# Patient Record
Sex: Male | Born: 1965 | Race: White | Hispanic: No | Marital: Married | Smoking: Current every day smoker
Health system: Southern US, Community
[De-identification: ages and names within clinical notes are randomized; demographics above are authoritative.]

## PROBLEM LIST (undated history)

## (undated) DIAGNOSIS — G250 Essential tremor: Secondary | ICD-10-CM

## (undated) DIAGNOSIS — I2510 Atherosclerotic heart disease of native coronary artery without angina pectoris: Secondary | ICD-10-CM

## (undated) DIAGNOSIS — E785 Hyperlipidemia, unspecified: Secondary | ICD-10-CM

## (undated) DIAGNOSIS — J189 Pneumonia, unspecified organism: Secondary | ICD-10-CM

## (undated) DIAGNOSIS — E119 Type 2 diabetes mellitus without complications: Secondary | ICD-10-CM

## (undated) DIAGNOSIS — I10 Essential (primary) hypertension: Secondary | ICD-10-CM

## (undated) HISTORY — PX: UMBILICAL HERNIA REPAIR: SHX2598

## (undated) HISTORY — DX: Hyperlipidemia, unspecified: E78.5

## (undated) HISTORY — PX: HERNIA REPAIR: SHX51

## (undated) HISTORY — DX: Essential tremor: G25.0

## (undated) HISTORY — PX: VASECTOMY: SHX75

## (undated) HISTORY — PX: FRACTURE SURGERY: SHX138

---

## 2019-04-02 ENCOUNTER — Other Ambulatory Visit: Payer: Self-pay

## 2019-04-02 DIAGNOSIS — Z20822 Contact with and (suspected) exposure to covid-19: Secondary | ICD-10-CM

## 2019-04-03 LAB — NOVEL CORONAVIRUS, NAA: SARS-CoV-2, NAA: NOT DETECTED

## 2019-07-16 HISTORY — PX: HIP ARTHROPLASTY: SHX981

## 2020-06-01 ENCOUNTER — Ambulatory Visit: Payer: Self-pay | Admitting: Cardiology

## 2020-08-03 ENCOUNTER — Ambulatory Visit: Payer: BC Managed Care – PPO | Admitting: Cardiology

## 2020-08-03 ENCOUNTER — Other Ambulatory Visit: Payer: Self-pay

## 2020-08-03 ENCOUNTER — Encounter: Payer: Self-pay | Admitting: Cardiology

## 2020-08-03 VITALS — BP 130/89 | HR 92 | Temp 98.7°F | Resp 16 | Ht 72.0 in | Wt 233.0 lb

## 2020-08-03 DIAGNOSIS — Z87891 Personal history of nicotine dependence: Secondary | ICD-10-CM

## 2020-08-03 DIAGNOSIS — M791 Myalgia, unspecified site: Secondary | ICD-10-CM

## 2020-08-03 DIAGNOSIS — R0602 Shortness of breath: Secondary | ICD-10-CM

## 2020-08-03 DIAGNOSIS — E7800 Pure hypercholesterolemia, unspecified: Secondary | ICD-10-CM

## 2020-08-03 DIAGNOSIS — Z8249 Family history of ischemic heart disease and other diseases of the circulatory system: Secondary | ICD-10-CM

## 2020-08-03 DIAGNOSIS — R072 Precordial pain: Secondary | ICD-10-CM

## 2020-08-03 DIAGNOSIS — T466X5A Adverse effect of antihyperlipidemic and antiarteriosclerotic drugs, initial encounter: Secondary | ICD-10-CM

## 2020-08-03 MED ORDER — METOPROLOL TARTRATE 50 MG PO TABS: 50.0000 mg | ORAL_TABLET | Freq: Two times a day (BID) | ORAL | 0 refills | Status: DC

## 2020-08-03 NOTE — Patient Instructions (Signed)

## 2020-08-03 NOTE — Progress Notes (Signed)
Date:  08/03/2020   ID:  Phillip Clark, DOB 1966-03-28, MRN [id number]  PCP:  Percell Belt, DO  Cardiologist:  Rex Kras, DO, [facility] (established care 08/03/2020)  REASON FOR CONSULT: Chest pain  REQUESTING PHYSICIAN:  Lazoff, Shawn P, DO [address] [place],  [place] [id number]  Chief Complaint  Patient presents with  . Chest Pain  . New Patient (Initial Visit)    HPI  Phillip Clark is a 55 y.o. male who presents to the office with a chief complaint of " chest pain." Patient's past medical history and cardiovascular risk factors include: Family history of premature coronary artery disease, pure hypercholesterolemia, former smoker, myalgias to statin therapy.  He is referred to the office at the request of Lazoff, Shawn P, DO for evaluation of chest pain.  Patient states that he has been having chest discomfort for the last 6 months; however, over the last 6 weeks it has been more noticeable.  Is located over the left anterior chest wall, intensity is 2 out of 10, last for a few minutes, brought on by effort related activities such as going up flights of stairs and/or walking with his wife, and gets better with resting.  Patient states that taking a deep breath also helps his symptoms especially his shortness of breath.  Patient has family history of premature CAD with his brother having 2 PCI's done at the age of 20 and father having CABG at the age of 34.  Patient was recently started on Crestor by his PCP due to hypercholesterolemia.  However has been having myalgias involving his right hip and right knee and therefore has stopped taking the medication.  FUNCTIONAL STATUS: No structured exercise program or daily routine.   ALLERGIES: Allergies  Allergen Reactions  . Crestor [Rosuvastatin Calcium]     MYALGIAS    MEDICATION LIST PRIOR TO VISIT: Current Meds  Medication Sig  . metoprolol tartrate (LOPRESSOR) 50 MG tablet Take 1 tablet (50 mg total) by mouth 2 (two)  times daily for 10 days.     PAST MEDICAL HISTORY: Past Medical History:  Diagnosis Date  . Essential tremor   . Hyperlipidemia     PAST SURGICAL HISTORY: Past Surgical History:  Procedure Laterality Date  . HERNIA REPAIR    . VASECTOMY      FAMILY HISTORY: The patient family history includes Diabetes in his mother; Heart attack in his father; Hyperlipidemia in his brother, father, mother, sister, sister, sister, and sister; Hypertension in his brother, father, mother, and sister; Thyroid disease in his mother.  SOCIAL HISTORY:  The patient  reports that he quit smoking about 4 years ago. His smoking use included cigarettes. He has a 34.00 pack-year smoking history. He has never used smokeless tobacco. He reports previous alcohol use. He reports that he does not use drugs.  REVIEW OF SYSTEMS: Review of Systems  Constitutional: Negative for chills and fever.  HENT: Negative for hoarse voice and nosebleeds.   Eyes: Negative for discharge, double vision and pain.  Cardiovascular: Positive for chest pain. Negative for claudication, dyspnea on exertion, leg swelling, near-syncope, orthopnea, palpitations, paroxysmal nocturnal dyspnea and syncope.  Respiratory: Positive for shortness of breath. Negative for hemoptysis.   Musculoskeletal: Negative for muscle cramps and myalgias.  Gastrointestinal: Negative for abdominal pain, constipation, diarrhea, hematemesis, hematochezia, melena, nausea and vomiting.  Neurological: Negative for dizziness and light-headedness.    PHYSICAL EXAM: Vitals with BMI 08/03/2020 08/03/2020  Height - $Remove'6\' 0"'pHbUbrW$   Weight -  233 lbs  BMI - 50.53  Systolic 976 734  Diastolic 89 90  Pulse 92 95    CONSTITUTIONAL: Well-developed and well-nourished. No acute distress.  SKIN: Skin is warm and dry. No rash noted. No cyanosis. No pallor. No jaundice HEAD: Normocephalic and atraumatic.  EYES: No scleral icterus MOUTH/THROAT: Moist oral membranes.  NECK: No JVD  present. No thyromegaly noted. No carotid bruits  LYMPHATIC: No visible cervical adenopathy.  CHEST Normal respiratory effort. No intercostal retractions  LUNGS: Clear to auscultation bilaterally.  No stridor. No wheezes. No rales.  CARDIOVASCULAR: Regular rate and rhythm, positive L9-F7, soft holosystolic murmur heard at the apex, no gallops or rubs. ABDOMINAL: No apparent ascites.  EXTREMITIES: No peripheral edema  HEMATOLOGIC: No significant bruising NEUROLOGIC: Oriented to person, place, and time. Nonfocal. Normal muscle tone.  PSYCHIATRIC: Normal mood and affect. Normal behavior. Cooperative  CARDIAC DATABASE: EKG: 08/03/2020: Normal sinus rhythm, 87 bpm, normal axis, left atrial enlargement without underlying ischemia or injury pattern.  Echocardiogram: No results found for this or any previous visit from the past 1095 days.   Stress Testing: No results found for this or any previous visit from the past 1095 days.  Heart Catheterization: None  LABORATORY DATA: External Labs: Collected: 06/29/2020, [facility] Hemoglobin 14.1 g/dL, hematocrit 40.9% Creatinine 0.94 mg/dL. eGFR: >90 mL/min per 1.73 m Lipid profile: Total cholesterol 239, triglycerides 130, HDL 45, LDL 199, non-HDL 194 AST 18, ALT 23, alkaline phosphatase 60   IMPRESSION:    ICD-10-CM   1. Precordial pain  R07.2 EKG 12-Lead    CT CORONARY MORPH W/CTA COR W/SCORE W/CA W/CM &/OR WO/CM    CT CORONARY FRACTIONAL FLOW RESERVE DATA PREP    CT CORONARY FRACTIONAL FLOW RESERVE FLUID ANALYSIS    metoprolol tartrate (LOPRESSOR) 50 MG tablet  2. Shortness of breath  R06.02 CT CORONARY MORPH W/CTA COR W/SCORE W/CA W/CM &/OR WO/CM    CT CORONARY FRACTIONAL FLOW RESERVE DATA PREP    CT CORONARY FRACTIONAL FLOW RESERVE FLUID ANALYSIS    PCV ECHOCARDIOGRAM COMPLETE  3. Pure hypercholesterolemia  E78.00 CK (Creatine Kinase)    CMP14+EGFR    High sensitivity CRP    LDL  cholesterol, direct    Lipoprotein A (LPA)    Lipid Panel With LDL/HDL Ratio  4. Former smoker  Z87.891   5. 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 Family history of premature CAD  Z82.49 CT CORONARY MORPH W/CTA COR W/SCORE W/CA W/CM &/OR WO/CM    CT CORONARY FRACTIONAL FLOW RESERVE DATA PREP    CT CORONARY FRACTIONAL FLOW RESERVE FLUID ANALYSIS    metoprolol tartrate (LOPRESSOR) 50 MG tablet     RECOMMENDATIONS: Klinton Candelas is a 55 y.o. male whose past medical history and cardiac risk factors include: Family history of premature coronary artery disease, pure hypercholesterolemia, former smoker, myalgias to statin therapy.  Precordial pain:  Patient symptoms are concerning for both typical and atypical features and has multiple cardiovascular risk factors as outlined above.  No active chest pain at the time of evaluation.  EKG shows normal sinus rhythm without underlying injury pattern.  Echocardiogram will be ordered to evaluate for structural heart disease and left ventricular systolic function.  Patient is not an ideal candidate for stress test as he is unable to exercise due to right hip and right knee pain.  Patient will be scheduled for coronary CTA.  Start Lopressor  50 mg p.o. twice daily  Hypercholesterolemia:  Patient is LDL is 199 mg/dL.  Was started on statin therapy by his PCP, Crestor 10 mg p.o. nightly.  After taking it for a few weeks patient started complaining of myalgias and the shared decision was to stop statin therapy and work on dietary changes as instructed per PCP.  We will recheck fasting lipid profile, check liver function, and CPK.  If patient does not have any signs of myositis would consider statin therapy given his symptoms and lipid profile.  Awaiting results.  Estimated 10-year risk of ASCVD 6%, per guidelines moderate intensity statin recommended.  However as discussed above due to myalgias  will await lab results prior to reordering another statin therapy.  Former smoker: Educated on the importance of continued smoking cessation.  Patient is asked to seek medical attention by going to the closest ER via EMS if his chest pain increases in intensity, frequency, duration or has typical discomfort as discussed at today's office visit.  Patient verbalizes understanding.  FINAL MEDICATION LIST END OF ENCOUNTER: Meds ordered this encounter  Medications  . metoprolol tartrate (LOPRESSOR) 50 MG tablet    Sig: Take 1 tablet (50 mg total) by mouth 2 (two) times daily for 10 days.    Dispense:  20 tablet    Refill:  0    There are no discontinued medications.   Current Outpatient Medications:  .  metoprolol tartrate (LOPRESSOR) 50 MG tablet, Take 1 tablet (50 mg total) by mouth 2 (two) times daily for 10 days., Disp: 20 tablet, Rfl: 0  Orders Placed This Encounter  Procedures  . CT CORONARY MORPH W/CTA COR W/SCORE W/CA W/CM &/OR WO/CM  . CT CORONARY FRACTIONAL FLOW RESERVE DATA PREP  . CT CORONARY FRACTIONAL FLOW RESERVE FLUID ANALYSIS  . CK (Creatine Kinase)  . CMP14+EGFR  . High sensitivity CRP  . LDL cholesterol, direct  . Lipoprotein A (LPA)  . Lipid Panel With LDL/HDL Ratio  . EKG 12-Lead  . PCV ECHOCARDIOGRAM COMPLETE    Patient Instructions   [email or web address]">  DASH Eating Plan DASH stands for Dietary Approaches to Stop Hypertension. The DASH eating plan is a healthy eating plan that has been shown to:  Reduce high blood pressure (hypertension).  Reduce your risk for type 2 diabetes, heart disease, and stroke.  Help with weight loss. What are tips for following this plan? Reading food labels  Check food labels for the amount of salt (sodium) per serving. Choose foods with less than 5 percent of the Daily Value of sodium. Generally, foods with less than 300 milligrams (mg) of sodium per serving fit into this  eating plan.  To find whole grains, look for the word "whole" as the first word in the ingredient list. Shopping  Buy products labeled as "low-sodium" or "no salt added."  Buy fresh foods. Avoid canned foods and pre-made or frozen meals. Cooking  Avoid adding salt when cooking. Use salt-free seasonings or herbs instead of table salt or sea salt. Check with your health care provider or pharmacist before using salt substitutes.  Do not fry foods. Cook foods using healthy methods such as baking, boiling, grilling, roasting, and broiling instead.  Cook with heart-healthy oils, such as olive, canola, avocado, soybean, or sunflower oil. Meal planning  Eat a balanced diet that includes: ? 4 or more servings of fruits and 4 or more servings of vegetables each day. Try to fill one-half of your plate with fruits and vegetables. ? 6-8  servings of whole grains each day. ? Less than 6 oz (170 g) of lean meat, poultry, or fish each day. A 3-oz (85-g) serving of meat is about the same size as a deck of cards. One egg equals 1 oz (28 g). ? 2-3 servings of low-fat dairy each day. One serving is 1 cup (237 mL). ? 1 serving of nuts, seeds, or beans 5 times each week. ? 2-3 servings of heart-healthy fats. Healthy fats called omega-3 fatty acids are found in foods such as walnuts, flaxseeds, fortified milks, and eggs. These fats are also found in cold-water fish, such as sardines, salmon, and mackerel.  Limit how much you eat of: ? Canned or prepackaged foods. ? Food that is high in trans fat, such as some fried foods. ? Food that is high in saturated fat, such as fatty meat. ? Desserts and other sweets, sugary drinks, and other foods with added sugar. ? Full-fat dairy products.  Do not salt foods before eating.  Do not eat more than 4 egg yolks a week.  Try to eat at least 2 vegetarian meals a week.  Eat more home-cooked food and less restaurant, buffet, and fast food.   Lifestyle  When eating  at a restaurant, ask that your food be prepared with less salt or no salt, if possible.  If you drink alcohol: ? Limit how much you use to:  0-1 drink a day for women who are not pregnant.  0-2 drinks a day for men. ? Be aware of how much alcohol is in your drink. In the [place] one drink equals one 12 oz bottle of beer (355 mL), one 5 oz glass of wine (148 mL), or one 1 oz glass of hard liquor (44 mL). General information  Avoid eating more than 2,300 mg of salt a day. If you have hypertension, you may need to reduce your sodium intake to 1,500 mg a day.  Work with your health care provider to maintain a healthy body weight or to lose weight. Ask what an ideal weight is for you.  Get at least 30 minutes of exercise that causes your heart to beat faster (aerobic exercise) most days of the week. Activities may include walking, swimming, or biking.  Work with your health care provider or dietitian to adjust your eating plan to your individual calorie needs. What foods should I eat? Fruits All fresh, dried, or frozen fruit. Canned fruit in natural juice (without added sugar). Vegetables Fresh or frozen vegetables (raw, steamed, roasted, or grilled). Low-sodium or reduced-sodium tomato and vegetable juice. Low-sodium or reduced-sodium tomato sauce and tomato paste. Low-sodium or reduced-sodium canned vegetables. Grains Whole-grain or whole-wheat bread. Whole-grain or whole-wheat pasta. Brown rice. [place]. Bulgur. Whole-grain and low-sodium cereals. Pita bread. Low-fat, low-sodium crackers. Whole-wheat flour tortillas. Meats and other proteins Skinless chicken or [place]. Ground chicken or [place]. Pork with fat trimmed off. Fish and seafood. Egg whites. Dried beans, peas, or lentils. Unsalted nuts, nut butters, and seeds. Unsalted canned beans. Lean cuts of beef with fat trimmed off. Low-sodium, lean precooked or cured meat, such as sausages or meat loaves. Dairy Low-fat (1%) or  fat-free (skim) milk. Reduced-fat, low-fat, or fat-free cheeses. Nonfat, low-sodium ricotta or cottage cheese. Low-fat or nonfat yogurt. Low-fat, low-sodium cheese. Fats and oils Soft margarine without trans fats. Vegetable oil. Reduced-fat, low-fat, or light mayonnaise and salad dressings (reduced-sodium). Canola, safflower, olive, avocado, soybean, and sunflower oils. Avocado. Seasonings and condiments Herbs. Spices. Seasoning mixes without salt. Other  foods Unsalted popcorn and pretzels. Fat-free sweets. The items listed above may not be a complete list of foods and beverages you can eat. Contact a dietitian for more information. What foods should I avoid? Fruits Canned fruit in a light or heavy syrup. Fried fruit. Fruit in cream or butter sauce. Vegetables Creamed or fried vegetables. Vegetables in a cheese sauce. Regular canned vegetables (not low-sodium or reduced-sodium). Regular canned tomato sauce and paste (not low-sodium or reduced-sodium). Regular tomato and vegetable juice (not low-sodium or reduced-sodium). Angie Fava. Olives. Grains Baked goods made with fat, such as croissants, muffins, or some breads. Dry pasta or rice meal packs. Meats and other proteins Fatty cuts of meat. Ribs. Fried meat. Berniece Salines. Bologna, salami, and other precooked or cured meats, such as sausages or meat loaves. Fat from the back of a pig (fatback). Bratwurst. Salted nuts and seeds. Canned beans with added salt. Canned or smoked fish. Whole eggs or egg yolks. Chicken or [place] with skin. Dairy Whole or 2% milk, cream, and half-and-half. Whole or full-fat cream cheese. Whole-fat or sweetened yogurt. Full-fat cheese. Nondairy creamers. Whipped toppings. Processed cheese and cheese spreads. Fats and oils Butter. Stick margarine. Lard. Shortening. Ghee. Bacon fat. Tropical oils, such as coconut, palm kernel, or palm oil. Seasonings and condiments Onion salt, garlic salt, seasoned salt, table salt, and sea salt.  Worcestershire sauce. Tartar sauce. Barbecue sauce. Teriyaki sauce. Soy sauce, including reduced-sodium. Steak sauce. Canned and packaged gravies. Fish sauce. Oyster sauce. Cocktail sauce. Store-bought horseradish. Ketchup. Mustard. Meat flavorings and tenderizers. Bouillon cubes. Hot sauces. Pre-made or packaged marinades. Pre-made or packaged taco seasonings. Relishes. Regular salad dressings. Other foods Salted popcorn and pretzels. The items listed above may not be a complete list of foods and beverages you should avoid. Contact a dietitian for more information. Where to find more information  National Heart, Lung, and Blood Institute: https://wilson-eaton.com/  American Heart Association: www.heart.org  Academy of Nutrition and Dietetics: www.eatright.[place]: www.kidney.org Summary  The DASH eating plan is a healthy eating plan that has been shown to reduce high blood pressure (hypertension). It may also reduce your risk for type 2 diabetes, heart disease, and stroke.  When on the DASH eating plan, aim to eat more fresh fruits and vegetables, whole grains, lean proteins, low-fat dairy, and heart-healthy fats.  With the DASH eating plan, you should limit salt (sodium) intake to 2,300 mg a day. If you have hypertension, you may need to reduce your sodium intake to 1,500 mg a day.  Work with your health care provider or dietitian to adjust your eating plan to your individual calorie needs. This information is not intended to replace advice given to you by your health care provider. Make sure you discuss any questions you have with your health care provider. Document Revised: 06/04/2019 Document Reviewed: 06/04/2019 Elsevier Patient Education  2021 [place] cardiac medications as reconciled in final medication list. --Return in about 2 weeks (around 08/17/2020) for Follow up, Chest pain. Or sooner if needed. --Continue follow-up with your primary care  physician regarding the management of your other chronic comorbid conditions.  Patient's questions and concerns were addressed to his satisfaction. He voices understanding of the instructions provided during this encounter.   This note was created using a voice recognition software as a result there may be grammatical errors inadvertently enclosed that do not reflect the nature of this encounter. Every attempt is made to correct such errors.  Braxden Lovering, DO,  [facility]  Pager: [phone] Office: [phone]

## 2020-08-07 ENCOUNTER — Telehealth (HOSPITAL_COMMUNITY): Payer: Self-pay | Admitting: *Deleted

## 2020-08-07 NOTE — Telephone Encounter (Signed)
Attempted to call patient regarding upcoming cardiac CT appointment. Left message on voicemail with name and callback number  Gillie Crisci RN Navigator Cardiac Imaging [facility] [phone] Office [phone] Cell  

## 2020-08-07 NOTE — Telephone Encounter (Signed)
Pt returning call regarding upcoming cardiac imaging study; pt verbalizes understanding of appt date/time, parking situation and where to check in, pre-test NPO status and medications ordered, and verified current allergies; name and call back number provided for further questions should they arise  Lyndal Alamillo RN Navigator Cardiac Imaging [facility] [phone] office [phone] cell  

## 2020-08-08 LAB — CK: Total CK: 136 U/L (ref 41–331)

## 2020-08-08 LAB — HIGH SENSITIVITY CRP: CRP, High Sensitivity: 6.07 mg/L — ABNORMAL HIGH (ref 0.00–3.00)

## 2020-08-08 LAB — CMP14+EGFR
ALT: 26 IU/L (ref 0–44)
AST: 20 IU/L (ref 0–40)
Albumin/Globulin Ratio: 1.8 (ref 1.2–2.2)
Albumin: 4.8 g/dL (ref 3.8–4.9)
Alkaline Phosphatase: 69 IU/L (ref 44–121)
BUN/Creatinine Ratio: 14 (ref 9–20)
BUN: 15 mg/dL (ref 6–24)
Bilirubin Total: 0.4 mg/dL (ref 0.0–1.2)
CO2: 24 mmol/L (ref 20–29)
Calcium: 9.5 mg/dL (ref 8.7–10.2)
Chloride: 102 mmol/L (ref 96–106)
Creatinine, Ser: 1.05 mg/dL (ref 0.76–1.27)
GFR calc Af Amer: 94 mL/min/{1.73_m2} (ref >59)
GFR calc non Af Amer: 81 mL/min/{1.73_m2} (ref >59)
Globulin, Total: 2.6 g/dL (ref 1.5–4.5)
Glucose: 120 mg/dL — ABNORMAL HIGH (ref 65–99)
Potassium: 4.8 mmol/L (ref 3.5–5.2)
Sodium: 140 mmol/L (ref 134–144)
Total Protein: 7.4 g/dL (ref 6.0–8.5)

## 2020-08-08 LAB — LDL CHOLESTEROL, DIRECT: LDL Direct: 182 mg/dL — ABNORMAL HIGH (ref 0–99)

## 2020-08-08 LAB — LIPID PANEL WITH LDL/HDL RATIO
Cholesterol, Total: 249 mg/dL — ABNORMAL HIGH (ref 100–199)
HDL: 37 mg/dL — ABNORMAL LOW (ref >39)
LDL Chol Calc (NIH): 179 mg/dL — ABNORMAL HIGH (ref 0–99)
LDL/HDL Ratio: 4.8 ratio — ABNORMAL HIGH (ref 0.0–3.6)
Triglycerides: 175 mg/dL — ABNORMAL HIGH (ref 0–149)
VLDL Cholesterol Cal: 33 mg/dL (ref 5–40)

## 2020-08-08 LAB — LIPOPROTEIN A (LPA): Lipoprotein (a): <8.4 nmol/L (ref <75.0)

## 2020-08-09 ENCOUNTER — Other Ambulatory Visit: Payer: Self-pay

## 2020-08-09 ENCOUNTER — Ambulatory Visit (HOSPITAL_COMMUNITY)
Admission: RE | Admit: 2020-08-09 | Discharge: 2020-08-09 | Disposition: A | Discharge status: Home / Self Care | Payer: BC Managed Care – PPO | Source: Ambulatory Visit | Attending: Family Medicine | Admitting: Family Medicine

## 2020-08-09 ENCOUNTER — Ambulatory Visit: Payer: BC Managed Care – PPO

## 2020-08-09 DIAGNOSIS — R0602 Shortness of breath: Secondary | ICD-10-CM

## 2020-08-09 DIAGNOSIS — R072 Precordial pain: Secondary | ICD-10-CM

## 2020-08-09 DIAGNOSIS — Z8249 Family history of ischemic heart disease and other diseases of the circulatory system: Secondary | ICD-10-CM

## 2020-08-09 MED ORDER — NITROGLYCERIN 0.4 MG SL SUBL
SUBLINGUAL_TABLET | SUBLINGUAL | Status: AC
  Filled 2020-08-09: qty 2

## 2020-08-09 MED ORDER — IOHEXOL 350 MG/ML SOLN
80.0000 mL | Freq: Once | INTRAVENOUS | Status: AC | PRN
  Administered 2020-08-09: 80 mL via INTRAVENOUS

## 2020-08-09 MED ORDER — METOPROLOL TARTRATE 5 MG/5ML IV SOLN
INTRAVENOUS | Status: AC
  Administered 2020-08-09: 5 mg
  Filled 2020-08-09: qty 20

## 2020-08-09 MED ORDER — METOPROLOL TARTRATE 5 MG/5ML IV SOLN
5.0000 mg | INTRAVENOUS | Status: DC | PRN
  Administered 2020-08-09: 5 mg via INTRAVENOUS

## 2020-08-09 MED ORDER — NITROGLYCERIN 0.4 MG SL SUBL
0.8000 mg | SUBLINGUAL_TABLET | Freq: Once | SUBLINGUAL | Status: AC
  Administered 2020-08-09: 0.8 mg via SUBLINGUAL

## 2020-08-14 ENCOUNTER — Ambulatory Visit (HOSPITAL_COMMUNITY)
Admission: RE | Admit: 2020-08-14 | Discharge: 2020-08-14 | Disposition: A | Discharge status: Home / Self Care | Payer: BC Managed Care – PPO | Source: Ambulatory Visit | Attending: Cardiology | Admitting: Cardiology

## 2020-08-14 DIAGNOSIS — R0600 Dyspnea, unspecified: Secondary | ICD-10-CM | POA: Insufficient documentation

## 2020-08-14 DIAGNOSIS — R0602 Shortness of breath: Secondary | ICD-10-CM | POA: Insufficient documentation

## 2020-08-14 DIAGNOSIS — R0609 Other forms of dyspnea: Secondary | ICD-10-CM | POA: Insufficient documentation

## 2020-08-14 DIAGNOSIS — Z8249 Family history of ischemic heart disease and other diseases of the circulatory system: Secondary | ICD-10-CM | POA: Insufficient documentation

## 2020-08-14 DIAGNOSIS — R072 Precordial pain: Secondary | ICD-10-CM | POA: Insufficient documentation

## 2020-08-16 NOTE — Progress Notes (Signed)
Spoke to patient he is aware

## 2020-08-22 ENCOUNTER — Other Ambulatory Visit: Payer: Self-pay

## 2020-08-22 ENCOUNTER — Ambulatory Visit: Payer: BC Managed Care – PPO | Admitting: Cardiology

## 2020-08-22 ENCOUNTER — Encounter: Payer: Self-pay | Admitting: Cardiology

## 2020-08-22 VITALS — BP 132/87 | HR 93 | Temp 98.4°F | Resp 16 | Ht 72.0 in | Wt 231.0 lb

## 2020-08-22 DIAGNOSIS — E7800 Pure hypercholesterolemia, unspecified: Secondary | ICD-10-CM

## 2020-08-22 DIAGNOSIS — Z8249 Family history of ischemic heart disease and other diseases of the circulatory system: Secondary | ICD-10-CM

## 2020-08-22 DIAGNOSIS — R072 Precordial pain: Secondary | ICD-10-CM

## 2020-08-22 DIAGNOSIS — I2510 Atherosclerotic heart disease of native coronary artery without angina pectoris: Secondary | ICD-10-CM

## 2020-08-22 DIAGNOSIS — R0602 Shortness of breath: Secondary | ICD-10-CM

## 2020-08-22 DIAGNOSIS — Z87891 Personal history of nicotine dependence: Secondary | ICD-10-CM

## 2020-08-22 DIAGNOSIS — I2584 Coronary atherosclerosis due to calcified coronary lesion: Secondary | ICD-10-CM

## 2020-08-22 MED ORDER — ASPIRIN EC 81 MG PO TBEC: 81.0000 mg | DELAYED_RELEASE_TABLET | Freq: Every day | ORAL | 3 refills | Status: DC

## 2020-08-22 MED ORDER — LISINOPRIL 20 MG PO TABS: 20.0000 mg | ORAL_TABLET | Freq: Every day | ORAL | 3 refills | Status: DC

## 2020-08-22 MED ORDER — METOPROLOL SUCCINATE ER 50 MG PO TB24: 50.0000 mg | ORAL_TABLET | Freq: Every morning | ORAL | 0 refills | Status: DC

## 2020-08-22 MED ORDER — ATORVASTATIN CALCIUM 40 MG PO TABS: 40.0000 mg | ORAL_TABLET | Freq: Every evening | ORAL | 0 refills | Status: DC

## 2020-08-22 NOTE — Progress Notes (Signed)
Date:  08/22/2020   ID:  Phillip Clark, DOB 05/10/1966, MRN [id number]  PCP:  Percell Belt, DO  Cardiologist:  Rex Kras, DO, [facility] (established care 08/03/2020)  Date: 08/22/20 Last Office Visit: 08/03/2020  Chief Complaint  Patient presents with  . Precordial pain  . Follow-up    HPI  Phillip Clark is a 55 y.o. male who presents to the office with a chief complaint of " reevaluation of chest pain and review test results. " Patient's past medical history and cardiovascular risk factors include: Severe coronary artery calcification, moderate coronary artery disease per cardiac CTA, family history of premature coronary artery disease, pure hypercholesterolemia, former smoker, myalgias to statin therapy.  He is referred to the office at the request of Lazoff, Shawn P, DO for evaluation of chest pain.  Since last office visit patient states that his chest pain continues to remain stable.  The discomfort is located over the left anterior chest wall, 2 out of 10 in intensity, brought on by effort late activity and at times does resolve with rest.  With regards to shortness of breath patient states that it is more frequent compared to the past.  No hospitalizations or urgent care visits since last office encounter.  Patient has family history of premature CAD with his brother having 2 PCI's done at the age of 42 and father having CABG at the age of 49.  Patient was recently started on Crestor by his PCP due to hypercholesterolemia.  However has been having myalgias involving his right hip and right knee and therefore has stopped taking the medication.  FUNCTIONAL STATUS: No structured exercise program or daily routine.   ALLERGIES: Allergies  Allergen Reactions  . Crestor [Rosuvastatin Calcium] Other (See Comments)    MYALGIAS  . Tape Rash    "takes skins off"    MEDICATION LIST PRIOR TO VISIT: Current Meds  Medication Sig  . aspirin EC 81 MG tablet Take 1 tablet (81 mg  total) by mouth daily. Swallow whole.  Marland Kitchen atorvastatin (LIPITOR) 40 MG tablet Take 1 tablet (40 mg total) by mouth at bedtime.  Marland Kitchen lisinopril (ZESTRIL) 20 MG tablet Take 1 tablet (20 mg total) by mouth daily.  . metoprolol succinate (TOPROL-XL) 50 MG 24 hr tablet Take 1 tablet (50 mg total) by mouth in the morning. Take with or immediately following a meal.     PAST MEDICAL HISTORY: Past Medical History:  Diagnosis Date  . Essential tremor   . Hyperlipidemia     PAST SURGICAL HISTORY: Past Surgical History:  Procedure Laterality Date  . HERNIA REPAIR    . VASECTOMY      FAMILY HISTORY: The patient family history includes Diabetes in his mother; Heart attack in his father; Hyperlipidemia in his brother, father, mother, sister, sister, sister, and sister; Hypertension in his brother, father, mother, and sister; Thyroid disease in his mother.  SOCIAL HISTORY:  The patient  reports that he quit smoking about 4 years ago. His smoking use included cigarettes. He has a 34.00 pack-year smoking history. He has never used smokeless tobacco. He reports previous alcohol use. He reports that he does not use drugs.  REVIEW OF SYSTEMS: Review of Systems  Constitutional: Negative for chills and fever.  HENT: Negative for hoarse voice and nosebleeds.   Eyes: Negative for discharge, double vision and pain.  Cardiovascular: Positive for chest pain. Negative for claudication, dyspnea on exertion, leg swelling, near-syncope, orthopnea, palpitations, paroxysmal nocturnal dyspnea and syncope.  Respiratory: Positive  for shortness of breath. Negative for hemoptysis.   Musculoskeletal: Negative for muscle cramps and myalgias.  Gastrointestinal: Negative for abdominal pain, constipation, diarrhea, hematemesis, hematochezia, melena, nausea and vomiting.  Neurological: Negative for dizziness and light-headedness.    PHYSICAL EXAM: Vitals with BMI 08/22/2020 08/09/2020 08/09/2020  Height _0  - -  Weight 231  lbs - -  BMI 85.63 - -  Systolic 149 702 637  Diastolic 87 64 83  Pulse 93 72 74    CONSTITUTIONAL: Well-developed and well-nourished. No acute distress.  SKIN: Skin is warm and dry. No rash noted. No cyanosis. No pallor. No jaundice HEAD: Normocephalic and atraumatic.  EYES: No scleral icterus MOUTH/THROAT: Moist oral membranes.  NECK: No JVD present. No thyromegaly noted. No carotid bruits  LYMPHATIC: No visible cervical adenopathy.  CHEST Normal respiratory effort. No intercostal retractions  LUNGS: Clear to auscultation bilaterally.  No stridor. No wheezes. No rales.  CARDIOVASCULAR: Regular rate and rhythm, positive C5-Y8, soft holosystolic murmur heard at the apex, no gallops or rubs. ABDOMINAL: No apparent ascites.  EXTREMITIES: No peripheral edema  HEMATOLOGIC: No significant bruising NEUROLOGIC: Oriented to person, place, and time. Nonfocal. Normal muscle tone.  PSYCHIATRIC: Normal mood and affect. Normal behavior. Cooperative  CARDIAC DATABASE: EKG: 08/03/2020: Normal sinus rhythm, 87 bpm, normal axis, left atrial enlargement without underlying ischemia or injury pattern.  Echocardiogram: 08/09/2020:  Left ventricle cavity is normal in size. Mild concentric hypertrophy of the left ventricle. Normal global wall motion. Normal LV systolic function with EF 62%. Doppler evidence of grade I (impaired) diastolic dysfunction, normal LAP. No significant valvular abnormality.  Normal right atrial pressure.    Stress Testing: No results found for this or any previous visit from the past 1095 days.  Heart Catheterization: None  CCTA:  08/09/2020: 1. Coronary calcium score of 534. This was 98th percentile for age and sex matched control. 2. Normal coronary origin with right dominance. 3. CAD-RADS = 3. Moderate stenosis within mid to distal LAD, Moderate stenosis within proximal and distal RCA, Moderate stenosis within OM1 branch. 4.  Aortic atherosclerosis. 1.  CT FFR analysis  showed no significant stenosis. RECOMMENDATIONS: Goal directed medical therapy and aggressive risk factor modification for secondary prevention of coronary artery disease.  LABORATORY DATA: External Labs: Collected: 06/29/2020, [facility] Hemoglobin 14.1 g/dL, hematocrit 40.9% Creatinine 0.94 mg/dL. eGFR: >90 mL/min per 1.73 m Lipid profile: Total cholesterol 239, triglycerides 130, HDL 45, LDL 199, non-HDL 194 AST 18, ALT 23, alkaline phosphatase 60   IMPRESSION:    ICD-10-CM   1. Precordial pain  R07.2 EKG 12-Lead    metoprolol succinate (TOPROL-XL) 50 MG 24 hr tablet  2. Shortness of breath  R06.02 lisinopril (ZESTRIL) 20 MG tablet  3. Pure hypercholesterolemia  E78.00 atorvastatin (LIPITOR) 40 MG tablet    Lipid Panel With LDL/HDL Ratio    LDL cholesterol, direct    Comp. Metabolic Panel (12)  4. Former smoker  Z87.891   5. Family history of premature CAD  Z82.49   6. Coronary atherosclerosis due to calcified coronary lesion  I25.10 atorvastatin (LIPITOR) 40 MG tablet   I25.84 aspirin EC 81 MG tablet     RECOMMENDATIONS: Herald Vallin is a 55 y.o. male whose past medical history and cardiac risk factors include: Severe coronary artery calcification, moderate coronary artery disease per cardiac CTA, family history of premature coronary artery disease, pure hypercholesterolemia, former smoker, myalgias to statin therapy.  Given his symptoms of precordial pain suggestive of  possible anginal equivalents shared decision was to proceed with an echocardiogram and coronary CTA.  Echocardiogram notes preserved LVEF without any significant valvular heart disease.  Coronary CTA notes moderate CAD but further assessment with CT FFR shows nonsignificant stenosis.  At this time would recommend aggressive lifestyle modifications given his multiple cardiovascular risk factors.  Given his anginal symptoms we will initiate Toprol-XL 50 mg p.o. daily.   Patient is asked to seek medical attention by going to the closest ER via EMS if his symptoms increase in intensity, frequency, and/or duration.  Given severe coronary artery calcification and pure hypercholesterolemia recommend aspirin 81 mg p.o. daily and statin therapy.  We will start Lipitor 40 mg p.o. nightly.  Of note, patient was started on Crestor in the past but was intolerant to it as it caused myalgias and hip pain.  Patient is asked to call the office if he is not able to tolerate Lipitor and then will possibly consider PCSK9 inhibitors given his LDL levels.  Otherwise I will recheck fasting lipid profile in 6 weeks to reevaluate therapy.  His shortness of breath may be secondary to primary pulmonary etiology given his history of smoking.  Patient is asked to follow-up with pulmonary medicine and discuss further with PCP.  For now we will start lisinopril 20 mg p.o. daily.  Former smoker: Educated on the importance of continued smoking cessation.  FINAL MEDICATION LIST END OF ENCOUNTER: Meds ordered this encounter  Medications  . atorvastatin (LIPITOR) 40 MG tablet    Sig: Take 1 tablet (40 mg total) by mouth at bedtime.    Dispense:  90 tablet    Refill:  0  . aspirin EC 81 MG tablet    Sig: Take 1 tablet (81 mg total) by mouth daily. Swallow whole.    Dispense:  90 tablet    Refill:  3  . metoprolol succinate (TOPROL-XL) 50 MG 24 hr tablet    Sig: Take 1 tablet (50 mg total) by mouth in the morning. Take with or immediately following a meal.    Dispense:  90 tablet    Refill:  0  . lisinopril (ZESTRIL) 20 MG tablet    Sig: Take 1 tablet (20 mg total) by mouth daily.    Dispense:  90 tablet    Refill:  3    Medications Discontinued During This Encounter  Medication Reason  . metoprolol tartrate (LOPRESSOR) 50 MG tablet Error     Current Outpatient Medications:  .  aspirin EC 81 MG tablet, Take 1 tablet (81 mg total) by mouth daily. Swallow whole., Disp: 90 tablet, Rfl:  3 .  atorvastatin (LIPITOR) 40 MG tablet, Take 1 tablet (40 mg total) by mouth at bedtime., Disp: 90 tablet, Rfl: 0 .  lisinopril (ZESTRIL) 20 MG tablet, Take 1 tablet (20 mg total) by mouth daily., Disp: 90 tablet, Rfl: 3 .  metoprolol succinate (TOPROL-XL) 50 MG 24 hr tablet, Take 1 tablet (50 mg total) by mouth in the morning. Take with or immediately following a meal., Disp: 90 tablet, Rfl: 0  Orders Placed This Encounter  Procedures  . Lipid Panel With LDL/HDL Ratio  . LDL cholesterol, direct  . Comp. Metabolic Panel (12)  . EKG 12-Lead    There are no Patient Instructions on file for this visit.  --Continue cardiac medications as reconciled in final medication list. --Return in about 7 weeks (around 10/10/2020) for Follow up, Chest pain, Lipid. Or sooner if needed. --Continue follow-up with your primary  care physician regarding the management of your other chronic comorbid conditions.  Patient's questions and concerns were addressed to his satisfaction. He voices understanding of the instructions provided during this encounter.   This note was created using a voice recognition software as a result there may be grammatical errors inadvertently enclosed that do not reflect the nature of this encounter. Every attempt is made to correct such errors.  Rex Kras, [place], [facility]  Pager: [phone] Office: [phone]

## 2020-09-27 ENCOUNTER — Telehealth: Payer: Self-pay

## 2020-09-27 NOTE — Telephone Encounter (Signed)
Pt called to say that his chest pain and sob is getting worse and he is concerned, he has a follow up with you 10/12/20

## 2020-09-27 NOTE — Telephone Encounter (Signed)
Called the patient and left a vm to call us back. If pain is severe he should go to ER for further evaluation

## 2020-09-28 NOTE — Telephone Encounter (Signed)
Patient called back patient stated no changes on his symptoms patient has not gotten any sleep since his chest pain started patient would like to speak to you or if he is able to see you sooner than 3/31 please advise

## 2020-09-29 ENCOUNTER — Encounter (HOSPITAL_COMMUNITY): Payer: Self-pay | Admitting: Cardiology

## 2020-09-29 ENCOUNTER — Inpatient Hospital Stay (HOSPITAL_COMMUNITY): Payer: BC Managed Care – PPO

## 2020-09-29 ENCOUNTER — Observation Stay (HOSPITAL_COMMUNITY)
Admission: AD | Admit: 2020-09-29 | Discharge: 2020-10-02 | Disposition: A | Discharge status: Home / Self Care | Payer: BC Managed Care – PPO | Source: Ambulatory Visit | Attending: Cardiology | Admitting: Cardiology

## 2020-09-29 DIAGNOSIS — I493 Ventricular premature depolarization: Secondary | ICD-10-CM

## 2020-09-29 DIAGNOSIS — I2584 Coronary atherosclerosis due to calcified coronary lesion: Secondary | ICD-10-CM | POA: Diagnosis not present

## 2020-09-29 DIAGNOSIS — E7800 Pure hypercholesterolemia, unspecified: Secondary | ICD-10-CM

## 2020-09-29 DIAGNOSIS — Z7982 Long term (current) use of aspirin: Secondary | ICD-10-CM | POA: Insufficient documentation

## 2020-09-29 DIAGNOSIS — I10 Essential (primary) hypertension: Secondary | ICD-10-CM | POA: Diagnosis not present

## 2020-09-29 DIAGNOSIS — Z79899 Other long term (current) drug therapy: Secondary | ICD-10-CM | POA: Insufficient documentation

## 2020-09-29 DIAGNOSIS — R0602 Shortness of breath: Secondary | ICD-10-CM | POA: Diagnosis not present

## 2020-09-29 DIAGNOSIS — R0600 Dyspnea, unspecified: Secondary | ICD-10-CM

## 2020-09-29 DIAGNOSIS — Z20822 Contact with and (suspected) exposure to covid-19: Secondary | ICD-10-CM | POA: Diagnosis not present

## 2020-09-29 DIAGNOSIS — I2510 Atherosclerotic heart disease of native coronary artery without angina pectoris: Secondary | ICD-10-CM | POA: Diagnosis not present

## 2020-09-29 DIAGNOSIS — R0609 Other forms of dyspnea: Secondary | ICD-10-CM

## 2020-09-29 DIAGNOSIS — G250 Essential tremor: Secondary | ICD-10-CM | POA: Diagnosis not present

## 2020-09-29 DIAGNOSIS — I700 Atherosclerosis of aorta: Secondary | ICD-10-CM

## 2020-09-29 DIAGNOSIS — E785 Hyperlipidemia, unspecified: Secondary | ICD-10-CM | POA: Insufficient documentation

## 2020-09-29 DIAGNOSIS — Z87891 Personal history of nicotine dependence: Secondary | ICD-10-CM

## 2020-09-29 DIAGNOSIS — I200 Unstable angina: Secondary | ICD-10-CM

## 2020-09-29 DIAGNOSIS — R079 Chest pain, unspecified: Secondary | ICD-10-CM | POA: Diagnosis present

## 2020-09-29 DIAGNOSIS — Z8249 Family history of ischemic heart disease and other diseases of the circulatory system: Secondary | ICD-10-CM

## 2020-09-29 HISTORY — DX: Atherosclerotic heart disease of native coronary artery without angina pectoris: I25.10

## 2020-09-29 HISTORY — DX: Essential (primary) hypertension: I10

## 2020-09-29 LAB — BASIC METABOLIC PANEL
Anion gap: 6 (ref 5–15)
BUN: 16 mg/dL (ref 6–20)
CO2: 25 mmol/L (ref 22–32)
Calcium: 8.7 mg/dL — ABNORMAL LOW (ref 8.9–10.3)
Chloride: 103 mmol/L (ref 98–111)
Creatinine, Ser: 1 mg/dL (ref 0.61–1.24)
GFR, Estimated: >60 mL/min (ref >60)
Glucose, Bld: 198 mg/dL — ABNORMAL HIGH (ref 70–99)
Potassium: 3.9 mmol/L (ref 3.5–5.1)
Sodium: 134 mmol/L — ABNORMAL LOW (ref 135–145)

## 2020-09-29 LAB — BRAIN NATRIURETIC PEPTIDE: B Natriuretic Peptide: 30.4 pg/mL (ref 0.0–100.0)

## 2020-09-29 LAB — CBC
HCT: 38.3 % — ABNORMAL LOW (ref 39.0–52.0)
Hemoglobin: 13.2 g/dL (ref 13.0–17.0)
MCH: 30.4 pg (ref 26.0–34.0)
MCHC: 34.5 g/dL (ref 30.0–36.0)
MCV: 88.2 fL (ref 80.0–100.0)
Platelets: 275 10*3/uL (ref 150–400)
RBC: 4.34 MIL/uL (ref 4.22–5.81)
RDW: 11.9 % (ref 11.5–15.5)
WBC: 6 10*3/uL (ref 4.0–10.5)
nRBC: 0 % (ref 0.0–0.2)

## 2020-09-29 LAB — TROPONIN I (HIGH SENSITIVITY)
Troponin I (High Sensitivity): 3 ng/L (ref <18)
Troponin I (High Sensitivity): 4 ng/L (ref <18)

## 2020-09-29 LAB — SARS CORONAVIRUS 2 (TAT 6-24 HRS): SARS Coronavirus 2: NEGATIVE

## 2020-09-29 LAB — MAGNESIUM: Magnesium: 2.3 mg/dL (ref 1.7–2.4)

## 2020-09-29 MED ORDER — ENOXAPARIN SODIUM 100 MG/ML ~~LOC~~ SOLN
1.0000 mg/kg | Freq: Two times a day (BID) | SUBCUTANEOUS | Status: DC
  Administered 2020-09-30 – 2020-10-01 (×4): 100 mg via SUBCUTANEOUS
  Filled 2020-09-29 (×6): qty 1

## 2020-09-29 MED ORDER — ASPIRIN 81 MG PO CHEW
324.0000 mg | CHEWABLE_TABLET | ORAL | Status: AC
  Administered 2020-09-29: 324 mg via ORAL
  Filled 2020-09-29: qty 4

## 2020-09-29 MED ORDER — ACETAMINOPHEN 325 MG PO TABS: 650.0000 mg | ORAL_TABLET | ORAL | Status: DC | PRN

## 2020-09-29 MED ORDER — ENOXAPARIN SODIUM 100 MG/ML ~~LOC~~ SOLN
1.0000 mg/kg | Freq: Once | SUBCUTANEOUS | Status: AC
  Administered 2020-09-29: 100 mg via SUBCUTANEOUS
  Filled 2020-09-29: qty 1

## 2020-09-29 MED ORDER — ATORVASTATIN CALCIUM 40 MG PO TABS
40.0000 mg | ORAL_TABLET | Freq: Every day | ORAL | Status: DC
  Administered 2020-09-29 – 2020-10-01 (×3): 40 mg via ORAL
  Filled 2020-09-29 (×3): qty 1

## 2020-09-29 MED ORDER — METOPROLOL SUCCINATE ER 100 MG PO TB24
100.0000 mg | ORAL_TABLET | Freq: Every morning | ORAL | Status: DC
  Administered 2020-09-30 – 2020-10-02 (×3): 100 mg via ORAL
  Filled 2020-09-29 (×3): qty 1

## 2020-09-29 MED ORDER — ONDANSETRON HCL 4 MG/2ML IJ SOLN: 4.0000 mg | Freq: Four times a day (QID) | INTRAMUSCULAR | Status: DC | PRN

## 2020-09-29 MED ORDER — NITROGLYCERIN 0.4 MG SL SUBL: 0.4000 mg | SUBLINGUAL_TABLET | SUBLINGUAL | Status: DC | PRN

## 2020-09-29 MED ORDER — METOPROLOL SUCCINATE ER 50 MG PO TB24: 50.0000 mg | ORAL_TABLET | Freq: Every morning | ORAL | Status: DC

## 2020-09-29 MED ORDER — ASPIRIN EC 81 MG PO TBEC
81.0000 mg | DELAYED_RELEASE_TABLET | Freq: Every day | ORAL | Status: DC
  Administered 2020-09-30 – 2020-10-02 (×4): 81 mg via ORAL
  Filled 2020-09-29 (×3): qty 1

## 2020-09-29 MED ORDER — ADULT MULTIVITAMIN W/MINERALS CH
1.0000 | ORAL_TABLET | Freq: Every day | ORAL | Status: DC
  Administered 2020-09-30 – 2020-10-01 (×2): 1 via ORAL
  Filled 2020-09-29 (×2): qty 1

## 2020-09-29 MED ORDER — ASPIRIN 300 MG RE SUPP: 300.0000 mg | RECTAL | Status: AC

## 2020-09-29 NOTE — Telephone Encounter (Signed)
Patient called the office that he has been having exertional chest pain over the last several days which has not improved.  I advised him to go to the hospital via EMS but he refuses and would like to be admitted electively.  A bed has been requested.  And he understands clearly that if the symptoms increase in intensity, frequency and or duration he should go to the closest ER via EMS.

## 2020-09-29 NOTE — H&P (Signed)
HISTORY AND PHYSICAL  Patient ID: Phillip Clark MRN: [id number] DOB/AGE: June 04, 1966 55 y.o.  Admit date: 09/29/2020 Attending physician: Rex Kras, [facility] Primary Physician:  Percell Belt, DO  Chief complaint: chest pain and shortness of breath  HPI:  Phillip Clark is a 55 y.o. Caucasian male who presents with a chief complaint of " chest pain and shortness of breath." His past medical history and cardiovascular risk factors include: Severe coronary artery calcification, moderate coronary artery disease per coronary CTA, family history of premature coronary artery disease, hypercholesterolemia, former smoker, intolerant to statin therapy in the past.  Patient is accompanied by his wife at bedside.  Patient states that he presents to the hospital as an elective admission for further evaluation of chest pain and shortness of breath.  Patient states that the symptoms started approximately 5 days ago and the worst pain that he is ever had was on 09/20/2020.  He decided to wait all these days because he thought he could treated at home however since his symptoms are continuing he had called the office for further evaluation and management.  Shared decision was to proceed with an elective admission.  At the time of the evaluation was chest pain-free.  However he states that his substernal chest discomfort arises when he exerts himself more than usual.  The symptoms last for about 30 minutes and usually improve with resting or sitting down and taking a deep breath.  And the discomfort usually radiates to the left shoulder and or the left arm.  Patient states that his dyspnea on exertion has noticeably got worsened over the last couple of days.  To the point that his coworkers asked him if he is okay after exerting short distances at work.  The symptoms usually subside after resting.  He denies orthopnea, paroxysmal nocturnal dyspnea or lower extremity swelling.  Prior to establishing care with  myself his LDL was 199 mg/dL.  After prolonged discussions he decided to start a moderate intensity statin which she is tolerating well.  Patient states that he did not do well with Crestor in the past which has caused him progressive hip pain.  After this coronary CTA results he was started on low-dose lisinopril for better blood pressure management and as well as metoprolol.  Patient states that he has not been taking his lisinopril as his home blood pressures are usually now 120/80 mmHg after implementing a low-salt diet and improving his cardiovascular risk factors.  Since his index event on 09/20/2020 patient states that his functional status is also reduced.  He is not able to exert himself as much as before.  Family history of premature CAD.  Brother having 2 PCI's done at the age of 49 and father had a CABG at the age of 64.  ALLERGIES: Allergies  Allergen Reactions  . Crestor [Rosuvastatin Calcium] Other (See Comments)    MYALGIAS  . Latex   . Rosuvastatin     Other reaction(s): Myalgias (intolerance)  . Topiramate     Other reaction(s): Other (See Comments) Tingling legs Tingling legs   . Varenicline     Other reaction(s): Other (See Comments) Suicidal idea Suicidal idea   . Tape Rash    "takes skins off"    PAST MEDICAL HISTORY: Past Medical History:  Diagnosis Date  . Coronary artery calcification of native artery   . Essential tremor   . Hyperlipidemia   . Hypertension   . Nonobstructive atherosclerosis of coronary artery   Non-obstructive CAD, per CCTA  Severe coronary calcification  PAST SURGICAL HISTORY: Past Surgical History:  Procedure Laterality Date  . HERNIA REPAIR    . VASECTOMY      FAMILY HISTORY: The patient family history includes Diabetes in his mother; Heart attack in his father; Hyperlipidemia in his brother, father, mother, sister, sister, sister, and sister; Hypertension in his brother, father, mother, and sister; Thyroid disease in his  mother.   SOCIAL HISTORY:  The patient  reports that he quit smoking about 4 years ago. His smoking use included cigarettes. He has a 34.00 pack-year smoking history. He has never used smokeless tobacco. He reports previous alcohol use. He reports that he does not use drugs.  MEDICATIONS: Current Outpatient Medications  Medication Instructions  . aspirin EC 81 mg, Oral, Daily, Swallow whole.  Marland Kitchen atorvastatin (LIPITOR) 40 mg, Oral, Nightly  . metoprolol succinate (TOPROL-XL) 50 mg, Oral, Every morning, Take with or immediately following a meal.  . Multiple Vitamin (MULTIVITAMIN ADULT PO) 1 tablet, Oral, Daily  . Omega-3 Fatty Acids (FISH OIL) 1000 MG CAPS 1 capsule, Oral, Daily    REVIEW OF SYSTEMS: Review of Systems  Constitutional: Negative for chills and fever.  HENT: Negative for hoarse voice and nosebleeds.   Eyes: Negative for discharge, double vision and pain.  Cardiovascular: Positive for chest pain and dyspnea on exertion. Negative for claudication, leg swelling, near-syncope, orthopnea, palpitations, paroxysmal nocturnal dyspnea and syncope.  Respiratory: Positive for shortness of breath. Negative for hemoptysis.   Musculoskeletal: Negative for muscle cramps and myalgias.  Gastrointestinal: Negative for abdominal pain, constipation, diarrhea, hematemesis, hematochezia, melena, nausea and vomiting.  Neurological: Positive for dizziness and light-headedness.  All other systems reviewed and are negative.   PHYSICAL EXAM: Vitals with BMI 09/29/2020 09/29/2020 09/29/2020  Height - - _0   Weight - - 222 lbs  BMI - - 08.14  Systolic 481 856 314  Diastolic 71 78 85  Pulse 83 67 84    No intake or output data in the 24 hours ending 09/29/20 2213  Net IO Since Admission: No IO data has been entered for this period [09/29/20 2213] CONSTITUTIONAL: Well-developed and well-nourished. No acute distress.  SKIN: Skin is warm and dry. No rash noted. No cyanosis. No pallor. No  jaundice HEAD: Normocephalic and atraumatic.  EYES: No scleral icterus MOUTH/THROAT: Moist oral membranes.  NECK: No JVD present. No thyromegaly noted. No carotid bruits  LYMPHATIC: No visible cervical adenopathy.  CHEST Normal respiratory effort. No intercostal retractions  LUNGS: Clear to auscultation bilaterally.  No stridor. No wheezes. No rales.  CARDIOVASCULAR: Regular rate and rhythm, positive S1-S2, no murmurs rubs or gallops appreciated. ABDOMINAL: No apparent ascites.  EXTREMITIES: No peripheral edema  HEMATOLOGIC: No significant bruising NEUROLOGIC: Oriented to person, place, and time. Nonfocal. Normal muscle tone.  PSYCHIATRIC: Normal mood and affect. Normal behavior. Cooperative  RADIOLOGY: Portable chest x-ray 1 view  Result Date: 09/29/2020 CLINICAL DATA:  Unstable angina EXAM: PORTABLE CHEST 1 VIEW COMPARISON:  None. FINDINGS: Heart and mediastinal contours are within normal limits. No focal opacities or effusions. No acute bony abnormality. IMPRESSION: No active disease. Electronically Signed   By: Rolm Baptise M.D.   On: 09/29/2020 20:58    LABORATORY DATA: Lab Results  Component Value Date   WBC 6.0 09/29/2020   HGB 13.2 09/29/2020   HCT 38.3 (L) 09/29/2020   MCV 88.2 09/29/2020   PLT 275 09/29/2020    Recent Labs  Lab 09/29/20 2042  NA 134*  K 3.9  CL  103  CO2 25  BUN 16  CREATININE 1.00  CALCIUM 8.7*  GLUCOSE 198*    Lipid Panel  Lab Results  Component Value Date   CHOL 249 (H) 08/07/2020   HDL 37 (L) 08/07/2020   LDLCALC 179 (H) 08/07/2020   LDLDIRECT 182 (H) 08/07/2020   TRIG 175 (H) 08/07/2020    BNP (last 3 results) No results for input(s): BNP in the last 8760 hours.  HEMOGLOBIN A1C No results found for: HGBA1C, MPG  Cardiac Panel (last 3 results) Recent Labs    09/29/20 1535 09/29/20 1805  TROPONINIHS 3 4     TSH No results for input(s): TSH in the last 8760 hours.    CARDIAC DATABASE: EKG: 09/29/2020 normal sinus  rhythm, 68 bpm, normal axis, occasional premature ventricular contractions, without underlying injury pattern.  Echocardiogram: 08/09/2020:  Left ventricle cavity is normal in size. Mild concentric hypertrophy of the left ventricle. Normal global wall motion. Normal LV systolic function with EF 62%. Doppler evidence of grade I (impaired) diastolic dysfunction, normal LAP. No significant valvular abnormality.  Normal right atrial pressure.   Stress Testing:  None  Heart Catheterization: None  CCTA 07/2020: Coronary calcium score of 534. This was 98th percentile for age and sex matched control. Normal coronary origin with right dominance. CAD-RADS = 3. Moderate stenosis within mid to distal LAD, Moderate stenosis within proximal and distal RCA, Moderate stenosis within OM1 branch. Aortic atherosclerosis. Study is sent for CT-FFR and findings will be performed and reported separately. CT FFR analysis showed no significant stenosis.  External Labs: Collected: 06/29/2020, [facility] Hemoglobin 14.1 g/dL, hematocrit 40.9% Creatinine 0.94 mg/dL. eGFR: >90 mL/min per 1.73 m Lipid profile: Total cholesterol 239, triglycerides 130, HDL 45, LDL 199, non-HDL 194 AST 18, ALT 23, alkaline phosphatase 60  IMPRESSION & RECOMMENDATIONS: Phillip Clark is a 55 y.o. Caucasian male whose past medical history and cardiovascular risk factors include: Severe coronary artery calcification, moderate coronary artery disease per coronary CTA, family history of premature coronary artery disease, hypercholesterolemia, former smoker, intolerant to statin therapy in the past.  Impression: Unstable angina. Dyspnea on exertion. Premature ventricular contractions. Hypercholesterolemia, pure. Severe coronary artery calcification, total coronary artery calcium score 534 AU.  Moderate nonobstructive CAD, prior coronary CTA. Aortic atherosclerosis Family history of  premature CAD. Former smoker.  Plan: Patient is index event what happened on 09/20/2020 but chose not to seek medical attention as he thought he could treated at home.  However his symptoms are getting progressively worse with precordial pain suggestive of cardiac etiology and dyspnea on exertion.  He had called the office for further evaluation and therefore the shared decision was to proceed with an elective admission; however, in the interim if the symptoms were to increase in intensity, frequency, and/or duration he will seek sooner medical attention by going to the closest ER.  EKG on arrival shows normal sinus rhythm without underlying injury pattern. High sensitive troponins negative x2.  Lovenox dosing per pharmacy for ACS. Full dose aspirin x1, and continue aspirin 81 mg p.o. daily. In the past patient has not been able to tolerate high intensity statin due to myalgias.  We will continue his home statin dose of Lipitor 20 mg p.o. nightly. Continue telemetry.  Tentative plan for left heart catheterization on Monday, March 21st 2022.  Check CBC, BMP, BNP, magnesium levels, trend troponins x2, fasting lipid profile in the morning.  Continue aspirin and statin therapy given severe coronary artery calcification  aortic atherosclerosis.  Echocardiogram will be ordered to evaluate for structural heart disease and left ventricular systolic function.  Given the frequent PVCs noted on telemetry and also on surface ECG will increase his metoprolol to 100 mg p.o. daily.  Will monitor vital sign heart rate.   Consultants: None   Code Status: Full Code.     Family Communication:  Wife at bedside during the encounter.     Disposition Plan: Back home with family.    Patient's questions and concerns were addressed to his and his wife's satisfaction. He and his wife voices understanding of the instructions provided during this encounter.   This note was created using a voice recognition  software as a result there may be grammatical errors inadvertently enclosed that do not reflect the nature of this encounter. Every attempt is made to correct such errors.  Total encounter time 83 minutes. *Total Encounter Time as defined by the Centers for Medicare and Medicaid Services includes, in addition to the face-to-face time of a patient visit (documented in the note above) non-face-to-face time: obtaining and reviewing outside history, ordering and reviewing medications, tests or procedures, care coordination (communications with other health care professionals or caregivers) and documentation in the medical record.   Mechele Claude [facility]  Pager: [phone] Office: [phone] 09/29/2020, 10:13 PM

## 2020-09-29 NOTE — Progress Notes (Addendum)
Pt admitted to rm 17 from doctor office. Take VS. Started IV. MD notified. EKG performed by order. Waiting for new orders.

## 2020-09-29 NOTE — Progress Notes (Signed)
ANTICOAGULATION CONSULT NOTE  Pharmacy Consult for Lovenox Indication: chest pain/ACS   Labs: Recent Labs    09/29/20 1535  HGB 13.2  HCT 38.3*  PLT 275    Assessment: 54 yom presenting with CP. Pharmacy consulted to dose Lovenox for ACS. Patient is not on anticoagulation PTA. CBC wnl, SCr 1.0 on admit. No bleed issues reported.  Goal of Therapy:  Anti-Xa level 0.6-1 units/ml 4hrs after LMWH dose given Monitor platelets by anticoagulation protocol: Yes   Plan:  Lovenox 100mg  (1mg /kg)  q12h Monitor CBC at least q72h, s/sx bleeding   , PharmD, BCPS Clinical Pharmacist 09/29/2020 7:42 PM

## 2020-09-30 ENCOUNTER — Other Ambulatory Visit (HOSPITAL_COMMUNITY): Payer: BC Managed Care – PPO

## 2020-09-30 ENCOUNTER — Inpatient Hospital Stay (HOSPITAL_COMMUNITY): Payer: BC Managed Care – PPO

## 2020-09-30 DIAGNOSIS — I2510 Atherosclerotic heart disease of native coronary artery without angina pectoris: Secondary | ICD-10-CM | POA: Diagnosis not present

## 2020-09-30 DIAGNOSIS — Z20822 Contact with and (suspected) exposure to covid-19: Secondary | ICD-10-CM | POA: Diagnosis not present

## 2020-09-30 DIAGNOSIS — R0602 Shortness of breath: Secondary | ICD-10-CM | POA: Diagnosis not present

## 2020-09-30 DIAGNOSIS — I2584 Coronary atherosclerosis due to calcified coronary lesion: Secondary | ICD-10-CM | POA: Diagnosis not present

## 2020-09-30 LAB — BASIC METABOLIC PANEL
Anion gap: 8 (ref 5–15)
BUN: 16 mg/dL (ref 6–20)
CO2: 26 mmol/L (ref 22–32)
Calcium: 8.8 mg/dL — ABNORMAL LOW (ref 8.9–10.3)
Chloride: 103 mmol/L (ref 98–111)
Creatinine, Ser: 0.99 mg/dL (ref 0.61–1.24)
GFR, Estimated: >60 mL/min (ref >60)
Glucose, Bld: 116 mg/dL — ABNORMAL HIGH (ref 70–99)
Potassium: 4.3 mmol/L (ref 3.5–5.1)
Sodium: 137 mmol/L (ref 135–145)

## 2020-09-30 LAB — ECHOCARDIOGRAM COMPLETE
Area-P 1/2: 2.6 cm2
Height: 71 in
S' Lateral: 3 cm
Weight: 3592 oz

## 2020-09-30 LAB — HIV ANTIBODY (ROUTINE TESTING W REFLEX): HIV Screen 4th Generation wRfx: NONREACTIVE

## 2020-09-30 LAB — MAGNESIUM: Magnesium: 2 mg/dL (ref 1.7–2.4)

## 2020-09-30 NOTE — Progress Notes (Signed)
Progress Note  Patient Name: Phillip Clark Date of Encounter: 09/30/2020  Attending physician: Rex Kras, DO Primary care provider: Percell Belt, DO Primary Cardiologist: Rex Kras, DO, FACC Consultant:Leland Staszewski [place], DO  Subjective: Phillip Clark is a 55 y.o. male who was seen and examined at bedside at approximately 220pm No events overnight. Patient has been chest pain-free since yesterday night. Like to get an echocardiogram. He denies any shortness of breath, lower extremity swelling, orthopnea or paroxysmal nocturnal dyspnea.  Objective: Vital Signs in the last 24 hours: Temp:  [98 F (36.7 C)-98.4 F (36.9 C)] 98 F (36.7 C) (03/19 1139) Pulse Rate:  [60-83] 68 (03/19 1139) Resp:  [16-30] 19 (03/19 1139) BP: (110-121)/(67-81) 110/69 (03/19 1139) SpO2:  [93 %-99 %] 97 % (03/19 1139) Weight:  [101.8 kg] 101.8 kg (03/19 0609)  Intake/Output: No intake or output data in the 24 hours ending 09/30/20 1631  Net IO Since Admission: No IO data has been entered for this period [09/30/20 1631]  Weights:  Filed Weights   09/29/20 1419 09/30/20 0609  Weight: 100.7 kg 101.8 kg    Telemetry: Personally reviewed.  Physical examination: PHYSICAL EXAM: Vitals with BMI 09/30/2020 09/30/2020 09/30/2020  Height - - -  Weight - - -  BMI - - -  Systolic 676 195 093  Diastolic 69 81 72  Pulse 68 75 70    CONSTITUTIONAL: Well-developed and well-nourished. No acute distress.  SKIN: Skin is warm and dry. No rash noted. No cyanosis. No pallor. No jaundice HEAD: Normocephalic and atraumatic.  EYES: No scleral icterus MOUTH/THROAT: Moist oral membranes.  NECK: No JVD present. No thyromegaly noted. No carotid bruits  LYMPHATIC: No visible cervical adenopathy.  CHEST Normal respiratory effort. No intercostal retractions  LUNGS: Clear to auscultation bilaterally.  No stridor. No wheezes. No rales.  CARDIOVASCULAR: Regular rate and rhythm, positive S1-S2, no murmurs rubs or  gallops appreciated. ABDOMINAL: No apparent ascites.  EXTREMITIES: No peripheral edema  HEMATOLOGIC: No significant bruising NEUROLOGIC: Oriented to person, place, and time. Nonfocal. Normal muscle tone.  PSYCHIATRIC: Normal mood and affect. Normal behavior. Cooperative  Lab Results: Hematology Recent Labs  Lab 09/29/20 1535  WBC 6.0  RBC 4.34  HGB 13.2  HCT 38.3*  MCV 88.2  MCH 30.4  MCHC 34.5  RDW 11.9  PLT 275    Chemistry Recent Labs  Lab 09/29/20 2042 09/30/20 0441  NA 134* 137  K 3.9 4.3  CL 103 103  CO2 25 26  GLUCOSE 198* 116*  BUN 16 16  CREATININE 1.00 0.99  CALCIUM 8.7* 8.8*  GFRNONAA >60 >60  ANIONGAP 6 8     Cardiac Enzymes: Cardiac Panel (last 3 results) Recent Labs    09/29/20 1535 09/29/20 1805  TROPONINIHS 3 4    BNP (last 3 results) Recent Labs    09/29/20 2205  BNP 30.4    ProBNP (last 3 results) No results for input(s): PROBNP in the last 8760 hours.   DDimer No results for input(s): DDIMER in the last 168 hours.   Hemoglobin A1c: No results found for: HGBA1C, MPG  TSH No results for input(s): TSH in the last 8760 hours.  Lipid Panel     Component Value Date/Time   CHOL 249 (H) 08/07/2020 0810   TRIG 175 (H) 08/07/2020 0810   HDL 37 (L) 08/07/2020 0810   LDLCALC 179 (H) 08/07/2020 0810   LDLDIRECT 182 (H) 08/07/2020 0810    Imaging: Portable chest x-ray 1 view  Result Date:  09/29/2020 CLINICAL DATA:  Unstable angina EXAM: PORTABLE CHEST 1 VIEW COMPARISON:  None. FINDINGS: Heart and mediastinal contours are within normal limits. No focal opacities or effusions. No acute bony abnormality. IMPRESSION: No active disease. Electronically Signed   By: Rolm Baptise M.D.   On: 09/29/2020 20:58   ECHOCARDIOGRAM COMPLETE  Result Date: 09/30/2020    ECHOCARDIOGRAM REPORT   Patient Name:   Phillip Clark Date of Exam: 09/30/2020 Medical Rec #:  [id number]      Height:       71.0 in Accession #:    [id number]     Weight:        224.5 lb Date of Birth:  1965-07-22      BSA:          2.215 m Patient Age:    67 years       BP:           110/69 mmHg Patient Gender: M              HR:           68 bpm. Exam Location:  Inpatient Procedure: 2D Echo Indications:     Chest Pain R07.9  History:         Patient has prior history of Echocardiogram examinations, most                  recent 08/09/2020. Arrythmias:PVC; Risk Factors:Former Smoker,                  Dyslipidemia and Hypertension. Dyspnea on exertion, Unstable                  angina.  Sonographer:     Alvino Chapel RCS Referring Phys:  [id number] Kahla Risdon Diagnosing Phys: Rex Kras DO IMPRESSIONS  1. Left ventricular ejection fraction, by estimation, is 60 to 65%. The left ventricle has normal function. The left ventricle has no regional wall motion abnormalities. Left ventricular diastolic parameters were normal.  2. Right ventricular systolic function is normal. The right ventricular size is normal.  3. The mitral valve is normal in structure. No evidence of mitral valve regurgitation. No evidence of mitral stenosis.  4. The aortic valve is normal in structure. Aortic valve regurgitation is not visualized. No aortic stenosis is present.  5. There is mild dilatation of the aortic root, measuring 40 mm.  6. The inferior vena cava is normal in size with greater than 50% respiratory variability, suggesting right atrial pressure of 3 mmHg. Comparison(s): No prior Echocardiogram. FINDINGS  Left Ventricle: Left ventricular ejection fraction, by estimation, is 60 to 65%. The left ventricle has normal function. The left ventricle has no regional wall motion abnormalities. The left ventricular internal cavity size was normal in size. There is  no left ventricular hypertrophy. Left ventricular diastolic parameters were normal. Right Ventricle: The right ventricular size is normal. No increase in right ventricular wall thickness. Right ventricular systolic function is normal. Left Atrium: Left  atrial size was normal in size. Right Atrium: Right atrial size was normal in size. Pericardium: There is no evidence of pericardial effusion. Mitral Valve: The mitral valve is normal in structure. No evidence of mitral valve regurgitation. No evidence of mitral valve stenosis. Tricuspid Valve: The tricuspid valve is normal in structure. Tricuspid valve regurgitation is not demonstrated. No evidence of tricuspid stenosis. Aortic Valve: The aortic valve is normal in structure. Aortic valve regurgitation is not visualized. No aortic stenosis is present. Pulmonic Valve:  The pulmonic valve was normal in structure. Pulmonic valve regurgitation is trivial. No evidence of pulmonic stenosis. Aorta: The ascending aorta was not well visualized. There is mild dilatation of the aortic root, measuring 40 mm. Venous: The inferior vena cava is normal in size with greater than 50% respiratory variability, suggesting right atrial pressure of 3 mmHg. IAS/Shunts: The interatrial septum appears to be lipomatous. The atrial septum is grossly normal.  LEFT VENTRICLE PLAX 2D LVIDd:         5.00 cm  Diastology LVIDs:         3.00 cm  LV e' medial:    5.77 cm/s LV PW:         1.10 cm  LV E/e' medial:  9.2 LV IVS:        0.90 cm  LV e' lateral:   8.05 cm/s LVOT diam:     2.20 cm  LV E/e' lateral: 6.6 LV SV:         78 LV SV Index:   35 LVOT Area:     3.80 cm  RIGHT VENTRICLE RV S prime:     14.60 cm/s TAPSE (M-mode): 2.4 cm LEFT ATRIUM             Index       RIGHT ATRIUM           Index LA diam:        4.20 cm 1.90 cm/m  RA Area:     20.90 cm LA Vol (A2C):   46.5 ml 21.00 ml/m RA Volume:   61.30 ml  27.68 ml/m LA Vol (A4C):   44.1 ml 19.91 ml/m LA Biplane Vol: 47.0 ml 21.22 ml/m  AORTIC VALVE LVOT Vmax:   111.00 cm/s LVOT Vmean:  70.600 cm/s LVOT VTI:    0.205 m  AORTA Ao Root diam: 4.00 cm MITRAL VALVE MV Area (PHT): 2.60 cm    SHUNTS MV Decel Time: 292 msec    Systemic VTI:  0.20 m MV E velocity: 53.30 cm/s  Systemic Diam: 2.20 cm  MV A velocity: 72.80 cm/s MV E/A ratio:  0.73 Hallie Ertl DO Electronically signed by Rex Kras DO Signature Date/Time: 09/30/2020/3:37:52 PM    Final    CARDIAC DATABASE: EKG: 09/29/2020 normal sinus rhythm, 68 bpm, normal axis, occasional premature ventricular contractions, without underlying injury pattern. 09/30/2020 EKG illustrates normal sinus rhythm, incomplete right bundle branch block, without underlying ischemia or injury pattern, and PVCs have resolved since prior EKG.  Echocardiogram: 09/30/2020: LVEF 60-65%, no regional wall motion abnormalities, normal diastolic filling pattern, normal right ventricular systolic size and function, mild dilatation of the aortic root, 40 mm.  No significant valvular heart disease.  Stress Testing:  None  Heart Catheterization: None  CCTA 07/2020: Coronary calcium score of 534. This was 98th percentile for age and sex matched control. Normal coronary origin with right dominance. CAD-RADS = 3. Moderate stenosis within mid to distal LAD, Moderate stenosis within proximal and distal RCA, Moderate stenosis within OM1 branch. Aortic atherosclerosis. Study is sent for CT-FFR and findings will be performed and reported separately. CT FFR analysis showed no significant stenosis.  External Labs: Collected: 06/29/2020, [facility] Hemoglobin 14.1 g/dL, hematocrit 40.9% Creatinine 0.94 mg/dL. eGFR: >90 mL/min per 1.73 m Lipid profile: Total cholesterol 239, triglycerides 130, HDL 45, LDL 199, non-HDL 194 AST 18, ALT 23, alkaline phosphatase 60  Scheduled Meds: . aspirin EC  81 mg Oral Daily  . atorvastatin  40 mg Oral QHS  . enoxaparin (LOVENOX) injection  1 mg/kg Subcutaneous Q12H  . metoprolol succinate  100 mg Oral q AM  . multivitamin with minerals  1 tablet Oral Daily   PRN Meds: acetaminophen, nitroGLYCERIN, ondansetron (ZOFRAN) IV   IMPRESSION & RECOMMENDATIONS: Amaury Kuzel is a 55 y.o.  male whose past medical history and cardiac risk factors include: Severe coronary artery calcification, moderate coronary artery disease per coronary CTA, family history of premature coronary artery disease, hypercholesterolemia, former smoker, intolerant to statin therapy in the past.  Impression: Unstable angina. Dyspnea on exertion. Premature ventricular contractions. Hypercholesterolemia, pure. Severe coronary artery calcification, total coronary artery calcium score 534 AU.  Moderate nonobstructive CAD, prior coronary CTA. Aortic atherosclerosis Family history of premature CAD. Former smoker.  Recommendations: Patient remains chest pain-free since yesterday night.  Currently on Lovenox per ACS protocol, beta-blocker therapy, aspirin and statin therapy.  2D echocardiogram notes preserved LVEF, no regional wall motion abnormalities, and no significant valvular heart disease.  When he had his coronary CTA he did not have any significant stenosis within the LAD, LCx or RCA distribution.  However his OM1 distal vessel CT FFR was 0.79.  At that time the shared decision was to proceed with lifestyle modifications and increasing GDMT and also addressing his lipids.  However, he presents to the hospital now with effort related chest pain and shortness of breath that has been going on since last week Wednesday.  This shared decision was to proceed with left heart catheterization with possible intervention. The procedure of left heart catheterization with possible intervention was explained to the patient in detail.  The indication, alternatives, risks and benefits were reviewed.  Complications include but not limited to bleeding, infection, vascular injury, stroke, myocardial infection, arrhythmia, kidney injury requiring hemodialysis, radiation-related injury in the case of prolonged fluoroscopy use, emergency cardiac surgery, and death. The patient understands the risks of serious complication  is 1-2 in 8588 with diagnostic cardiac cath and 1-2% or less with angioplasty/stenting.  The patient voices understanding and provides verbal feedback and wishes to proceed with coronary angiography with possible PCI.  Check fasting lipid profile in the morning.  EKG illustrates normal sinus rhythm, incomplete right bundle branch block, without underlying ischemia or injury pattern, and PVCs have resolved since prior EKG.  Patient's questions and concerns were addressed to his satisfaction. He voices understanding of the instructions provided during this encounter.   This note was created using a voice recognition software as a result there may be grammatical errors inadvertently enclosed that do not reflect the nature of this encounter. Every attempt is made to correct such errors.  Rex Kras, DO, [facility]. [place] Office: [phone] 09/30/2020, 4:31 PM

## 2020-09-30 NOTE — Progress Notes (Signed)
*  PRELIMINARY RESULTS* Echocardiogram 2D Echocardiogram has been performed.  Stacey Drain 09/30/2020, 2:58 PM

## 2020-09-30 NOTE — Progress Notes (Signed)
Mobility Specialist - Progress Note   09/30/20 1107  Mobility  Activity Ambulated in hall  Level of Assistance Independent  Assistive Device None  Distance Ambulated (ft) 790 ft  Mobility Response Tolerated well  Mobility performed by Mobility specialist  $Mobility charge 1 Mobility   Pre-mobility: 87 HR During mobility: 91 HR Post-mobility: 79 HR  Pt asx throughout ambulation. Pt to recliner after walk.   Mamie Levers Mobility Specialist Mobility Specialist Phone: [phone]

## 2020-09-30 NOTE — Plan of Care (Signed)
  Problem: Health Behavior/Discharge Planning: Goal: Ability to manage health-related needs will improve Outcome: Progressing   Problem: Clinical Measurements: Goal: Diagnostic test results will improve Outcome: Progressing   

## 2020-10-01 DIAGNOSIS — I2584 Coronary atherosclerosis due to calcified coronary lesion: Secondary | ICD-10-CM | POA: Diagnosis not present

## 2020-10-01 DIAGNOSIS — I2510 Atherosclerotic heart disease of native coronary artery without angina pectoris: Secondary | ICD-10-CM | POA: Diagnosis not present

## 2020-10-01 DIAGNOSIS — Z20822 Contact with and (suspected) exposure to covid-19: Secondary | ICD-10-CM | POA: Diagnosis not present

## 2020-10-01 DIAGNOSIS — R0602 Shortness of breath: Secondary | ICD-10-CM | POA: Diagnosis not present

## 2020-10-01 LAB — PROTIME-INR
INR: 1 (ref 0.8–1.2)
Prothrombin Time: 12.3 seconds (ref 11.4–15.2)

## 2020-10-01 LAB — CBC
HCT: 38.6 % — ABNORMAL LOW (ref 39.0–52.0)
Hemoglobin: 13.6 g/dL (ref 13.0–17.0)
MCH: 30.5 pg (ref 26.0–34.0)
MCHC: 35.2 g/dL (ref 30.0–36.0)
MCV: 86.5 fL (ref 80.0–100.0)
Platelets: 282 10*3/uL (ref 150–400)
RBC: 4.46 MIL/uL (ref 4.22–5.81)
RDW: 11.8 % (ref 11.5–15.5)
WBC: 5.3 10*3/uL (ref 4.0–10.5)
nRBC: 0 % (ref 0.0–0.2)

## 2020-10-01 LAB — BASIC METABOLIC PANEL
Anion gap: 6 (ref 5–15)
BUN: 15 mg/dL (ref 6–20)
CO2: 24 mmol/L (ref 22–32)
Calcium: 9 mg/dL (ref 8.9–10.3)
Chloride: 105 mmol/L (ref 98–111)
Creatinine, Ser: 1.04 mg/dL (ref 0.61–1.24)
GFR, Estimated: >60 mL/min (ref >60)
Glucose, Bld: 119 mg/dL — ABNORMAL HIGH (ref 70–99)
Potassium: 4.3 mmol/L (ref 3.5–5.1)
Sodium: 135 mmol/L (ref 135–145)

## 2020-10-01 LAB — MAGNESIUM: Magnesium: 2 mg/dL (ref 1.7–2.4)

## 2020-10-01 MED ORDER — SODIUM CHLORIDE 0.9% FLUSH
3.0000 mL | Freq: Two times a day (BID) | INTRAVENOUS | Status: DC
  Administered 2020-10-02: 3 mL via INTRAVENOUS

## 2020-10-01 MED ORDER — SODIUM CHLORIDE 0.9 % WEIGHT BASED INFUSION: 1.0000 mL/kg/h | INTRAVENOUS | Status: DC

## 2020-10-01 MED ORDER — SODIUM CHLORIDE 0.9% FLUSH: 3.0000 mL | INTRAVENOUS | Status: DC | PRN

## 2020-10-01 MED ORDER — SODIUM CHLORIDE 0.9 % WEIGHT BASED INFUSION
3.0000 mL/kg/h | INTRAVENOUS | Status: AC
  Administered 2020-10-02: 3 mL/kg/h via INTRAVENOUS

## 2020-10-01 MED ORDER — SODIUM CHLORIDE 0.9 % IV SOLN: 250.0000 mL | INTRAVENOUS | Status: DC | PRN

## 2020-10-01 NOTE — Progress Notes (Signed)
Mobility Specialist - Progress Note   10/01/20 1111  Mobility  Activity Ambulated in hall  Level of Assistance Standby assist, set-up cues, supervision of patient - no hands on  Assistive Device None  Distance Ambulated (ft) 1000 ft  Mobility Response Tolerated well  Mobility performed by Mobility specialist  $Mobility charge 1 Mobility   Pt ambulated to [facility] and back to room. He required one brief standing rest break due to SOB. Pt to chair after walk. VSS.   Mamie Levers Mobility Specialist Mobility Specialist Phone: [phone]

## 2020-10-01 NOTE — H&P (View-Only) (Signed)
Progress Note  Patient Name: Phillip Clark Date of Encounter: 10/01/2020  Attending physician: Rex Kras, DO Primary care provider: Percell Belt, DO Primary Cardiologist: Rex Kras, DO, FACC Consultant:Sunit Terri Skains, DO  Subjective: Phillip Clark is a 55 y.o. male who was seen and examined at bedside at approximately 1110am Chest pain for approximately 15 minutes yesterday night that resolved without intervention. Currently remains pain free He denies any shortness of breath, lower extremity swelling, orthopnea or paroxysmal nocturnal dyspnea.  Objective: Vital Signs in the last 24 hours: Temp:  [98 F (36.7 C)-98.3 F (36.8 C)] 98.3 F (36.8 C) (03/20 1139) Pulse Rate:  [65-77] 77 (03/20 1139) Resp:  [11-20] 20 (03/20 1139) BP: (121-135)/(68-79) 126/79 (03/20 1139) SpO2:  [94 %-99 %] 97 % (03/20 1139) Weight:  [100.4 kg] 100.4 kg (03/20 0500)  Intake/Output:  Intake/Output Summary (Last 24 hours) at 10/01/2020 1313 Last data filed at 09/30/2020 1735 Gross per 24 hour  Intake 240 ml  Output --  Net 240 ml    Net IO Since Admission: 720 mL [10/01/20 1313]  Weights:  Filed Weights   09/29/20 1419 09/30/20 0609 10/01/20 0500  Weight: 100.7 kg 101.8 kg 100.4 kg    Telemetry: Personally reviewed.  Normal sinus rhythm without significant ectopy  Physical examination: PHYSICAL EXAM: Vitals with BMI 10/01/2020 10/01/2020 10/01/2020  Height - - -  Weight - - 221 lbs 5 oz  BMI - - 62.83  Systolic 151 761 -  Diastolic 79 68 -  Pulse 77 65 -    CONSTITUTIONAL: Well-developed and well-nourished. No acute distress.  SKIN: Skin is warm and dry. No rash noted. No cyanosis. No pallor. No jaundice HEAD: Normocephalic and atraumatic.  EYES: No scleral icterus MOUTH/THROAT: Moist oral membranes.  NECK: No JVD present. No thyromegaly noted. No carotid bruits  LYMPHATIC: No visible cervical adenopathy.  CHEST Normal respiratory effort. No intercostal retractions  LUNGS:  Clear to auscultation bilaterally.  No stridor. No wheezes. No rales.  CARDIOVASCULAR: Regular rate and rhythm, positive S1-S2, no murmurs rubs or gallops appreciated. ABDOMINAL: No apparent ascites.  EXTREMITIES: No peripheral edema  HEMATOLOGIC: No significant bruising NEUROLOGIC: Oriented to person, place, and time. Nonfocal. Normal muscle tone.  PSYCHIATRIC: Normal mood and affect. Normal behavior. Cooperative  Lab Results: Hematology Recent Labs  Lab 09/29/20 1535 10/01/20 0428  WBC 6.0 5.3  RBC 4.34 4.46  HGB 13.2 13.6  HCT 38.3* 38.6*  MCV 88.2 86.5  MCH 30.4 30.5  MCHC 34.5 35.2  RDW 11.9 11.8  PLT 275 282    Chemistry Recent Labs  Lab 09/29/20 2042 09/30/20 0441 10/01/20 0428  NA 134* 137 135  K 3.9 4.3 4.3  CL 103 103 105  CO2 _0 GLUCOSE 198* 116* 119*  BUN _1 CREATININE 1.00 0.99 1.04  CALCIUM 8.7* 8.8* 9.0  GFRNONAA >60 >60 >60  ANIONGAP _2 Cardiac Enzymes: Cardiac Panel (last 3 results) Recent Labs    09/29/20 1535 09/29/20 1805  TROPONINIHS 3 4    BNP (last 3 results) Recent Labs    09/29/20 2205  BNP 30.4    ProBNP (last 3 results) No results for input(s): PROBNP in the last 8760 hours.   DDimer No results for input(s): DDIMER in the last 168 hours.   Hemoglobin A1c: No results found for: HGBA1C, MPG  TSH No results for input(s): TSH in the last 8760 hours.  Lipid Panel  Component Value Date/Time   CHOL 249 (H) 08/07/2020 0810   TRIG 175 (H) 08/07/2020 0810   HDL 37 (L) 08/07/2020 0810   LDLCALC 179 (H) 08/07/2020 0810   LDLDIRECT 182 (H) 08/07/2020 0810    Imaging: Portable chest x-ray 1 view  Result Date: 09/29/2020 CLINICAL DATA:  Unstable angina EXAM: PORTABLE CHEST 1 VIEW COMPARISON:  None. FINDINGS: Heart and mediastinal contours are within normal limits. No focal opacities or effusions. No acute bony abnormality. IMPRESSION: No active disease. Electronically Signed   By: Rolm Baptise M.D.    On: 09/29/2020 20:58   ECHOCARDIOGRAM COMPLETE  Result Date: 09/30/2020    ECHOCARDIOGRAM REPORT   Patient Name:   Phillip Clark Date of Exam: 09/30/2020 Medical Rec #:  [id number]      Height:       71.0 in Accession #:    [id number]     Weight:       224.5 lb Date of Birth:  01-23-1966      BSA:          2.215 m Patient Age:    56 years       BP:           110/69 mmHg Patient Gender: M              HR:           68 bpm. Exam Location:  Inpatient Procedure: 2D Echo Indications:     Chest Pain R07.9  History:         Patient has prior history of Echocardiogram examinations, most                  recent 08/09/2020. Arrythmias:PVC; Risk Factors:Former Smoker,                  Dyslipidemia and Hypertension. Dyspnea on exertion, Unstable                  angina.  Sonographer:     Alvino Chapel RCS Referring Phys:  [id number] SUNIT TOLIA Diagnosing Phys: Rex Kras DO IMPRESSIONS  1. Left ventricular ejection fraction, by estimation, is 60 to 65%. The left ventricle has normal function. The left ventricle has no regional wall motion abnormalities. Left ventricular diastolic parameters were normal.  2. Right ventricular systolic function is normal. The right ventricular size is normal.  3. The mitral valve is normal in structure. No evidence of mitral valve regurgitation. No evidence of mitral stenosis.  4. The aortic valve is normal in structure. Aortic valve regurgitation is not visualized. No aortic stenosis is present.  5. There is mild dilatation of the aortic root, measuring 40 mm.  6. The inferior vena cava is normal in size with greater than 50% respiratory variability, suggesting right atrial pressure of 3 mmHg. Comparison(s): No prior Echocardiogram. FINDINGS  Left Ventricle: Left ventricular ejection fraction, by estimation, is 60 to 65%. The left ventricle has normal function. The left ventricle has no regional wall motion abnormalities. The left ventricular internal cavity size was normal in size. There is   no left ventricular hypertrophy. Left ventricular diastolic parameters were normal. Right Ventricle: The right ventricular size is normal. No increase in right ventricular wall thickness. Right ventricular systolic function is normal. Left Atrium: Left atrial size was normal in size. Right Atrium: Right atrial size was normal in size. Pericardium: There is no evidence of pericardial effusion. Mitral Valve: The mitral valve is normal in structure. No evidence of mitral  valve regurgitation. No evidence of mitral valve stenosis. Tricuspid Valve: The tricuspid valve is normal in structure. Tricuspid valve regurgitation is not demonstrated. No evidence of tricuspid stenosis. Aortic Valve: The aortic valve is normal in structure. Aortic valve regurgitation is not visualized. No aortic stenosis is present. Pulmonic Valve: The pulmonic valve was normal in structure. Pulmonic valve regurgitation is trivial. No evidence of pulmonic stenosis. Aorta: The ascending aorta was not well visualized. There is mild dilatation of the aortic root, measuring 40 mm. Venous: The inferior vena cava is normal in size with greater than 50% respiratory variability, suggesting right atrial pressure of 3 mmHg. IAS/Shunts: The interatrial septum appears to be lipomatous. The atrial septum is grossly normal.  LEFT VENTRICLE PLAX 2D LVIDd:         5.00 cm  Diastology LVIDs:         3.00 cm  LV e' medial:    5.77 cm/s LV PW:         1.10 cm  LV E/e' medial:  9.2 LV IVS:        0.90 cm  LV e' lateral:   8.05 cm/s LVOT diam:     2.20 cm  LV E/e' lateral: 6.6 LV SV:         78 LV SV Index:   35 LVOT Area:     3.80 cm  RIGHT VENTRICLE RV S prime:     14.60 cm/s TAPSE (M-mode): 2.4 cm LEFT ATRIUM             Index       RIGHT ATRIUM           Index LA diam:        4.20 cm 1.90 cm/m  RA Area:     20.90 cm LA Vol (A2C):   46.5 ml 21.00 ml/m RA Volume:   61.30 ml  27.68 ml/m LA Vol (A4C):   44.1 ml 19.91 ml/m LA Biplane Vol: 47.0 ml 21.22 ml/m   AORTIC VALVE LVOT Vmax:   111.00 cm/s LVOT Vmean:  70.600 cm/s LVOT VTI:    0.205 m  AORTA Ao Root diam: 4.00 cm MITRAL VALVE MV Area (PHT): 2.60 cm    SHUNTS MV Decel Time: 292 msec    Systemic VTI:  0.20 m MV E velocity: 53.30 cm/s  Systemic Diam: 2.20 cm MV A velocity: 72.80 cm/s MV E/A ratio:  0.73 Sunit Tolia DO Electronically signed by Rex Kras DO Signature Date/Time: 09/30/2020/3:37:52 PM    Final    CARDIAC DATABASE: EKG: 09/29/2020 normal sinus rhythm, 68 bpm, normal axis, occasional premature ventricular contractions, without underlying injury pattern. 09/30/2020 EKG illustrates normal sinus rhythm, incomplete right bundle branch block, without underlying ischemia or injury pattern, and PVCs have resolved since prior EKG.  Echocardiogram: 09/30/2020: LVEF 60-65%, no regional wall motion abnormalities, normal diastolic filling pattern, normal right ventricular systolic size and function, mild dilatation of the aortic root, 40 mm.  No significant valvular heart disease.  Stress Testing:  None  Heart Catheterization: None  CCTA 07/2020: Coronary calcium score of 534. This was 98th percentile for age and sex matched control. Normal coronary origin with right dominance. CAD-RADS = 3. Moderate stenosis within mid to distal LAD, Moderate stenosis within proximal and distal RCA, Moderate stenosis within OM1 branch. Aortic atherosclerosis. Study is sent for CT-FFR and findings will be performed and reported separately. CT FFR analysis showed no significant stenosis.  External Labs: Collected: 06/29/2020, [facility] Hemoglobin 14.1 g/dL, hematocrit 40.9% Creatinine 0.94 mg/dL. eGFR: >90 mL/min per 1.73 m Lipid profile: Total cholesterol 239, triglycerides 130, HDL 45, LDL 199, non-HDL 194 AST 18, ALT 23, alkaline phosphatase 60  Scheduled Meds: . aspirin EC  81 mg Oral Daily  . atorvastatin  40 mg Oral QHS  . enoxaparin (LOVENOX)  injection  1 mg/kg Subcutaneous Q12H  . metoprolol succinate  100 mg Oral q AM  . multivitamin with minerals  1 tablet Oral Daily   PRN Meds: acetaminophen, nitroGLYCERIN, ondansetron (ZOFRAN) IV   IMPRESSION & RECOMMENDATIONS: Phillip Clark is a 55 y.o. male whose past medical history and cardiac risk factors include: Severe coronary artery calcification, moderate coronary artery disease per coronary CTA, family history of premature coronary artery disease, hypercholesterolemia, former smoker, intolerant to statin therapy in the past.  Impression: Unstable angina: Improving Dyspnea on exertion: Resolved Premature ventricular contractions: Improving Hypercholesterolemia, pure. Severe coronary artery calcification, total coronary artery calcium score 534 AU.  Moderate nonobstructive CAD, prior coronary CTA. Aortic atherosclerosis Family history of premature CAD. Former smoker.  Recommendations: 1 episode of chest pain yesterday night approximately 15 minutes in duration which is self-limited.  Currently chest pain-free.  On Lovenox per ACS protocol, beta-blocker therapy, aspirin and statin therapy.  2D echocardiogram notes preserved LVEF, no regional wall motion abnormalities, and no significant valvular heart disease.  When he had his coronary CTA he did not have any significant stenosis within the LAD, LCx or RCA distribution.  However his OM1 distal vessel CT FFR was 0.79.  At that time the shared decision was to proceed with lifestyle modifications and increasing GDMT and also addressing his lipids.  However, he presents to the hospital now with effort related chest pain and shortness of breath that has been going on since last week Wednesday.  This shared decision was to proceed with left heart catheterization with possible intervention. The procedure of left heart catheterization with possible intervention was explained to the patient in detail.  The indication, alternatives,  risks and benefits were reviewed.  Complications include but not limited to bleeding, infection, vascular injury, stroke, myocardial infection, arrhythmia, kidney injury requiring hemodialysis, radiation-related injury in the case of prolonged fluoroscopy use, emergency cardiac surgery, and death. The patient understands the risks of serious complication is 1-2 in 8416 with diagnostic cardiac cath and 1-2% or less with angioplasty/stenting.  The patient voices understanding and provides verbal feedback and wishes to proceed with coronary angiography with possible PCI.  Check fasting lipid profile in the morning.  Patient's questions and concerns were addressed to his satisfaction. He voices understanding of the instructions provided during this encounter.   This note was created using a voice recognition software as a result there may be grammatical errors inadvertently enclosed that do not reflect the nature of this encounter. Every attempt is made to correct such errors.  Total time spent: 25 minutes  Tecia Cinnamon Mead, DO, [facility]. [place] Office: [phone] 10/01/2020, 1:13 PM

## 2020-10-01 NOTE — Progress Notes (Signed)
Progress Note  Patient Name: Phillip Clark Date of Encounter: 10/01/2020  Attending physician: Aaleyah Witherow, DO Primary care provider: Lazoff, Shawn P, DO Primary Cardiologist: Raylyn Speckman, DO, FACC Consultant:Nyrie Sigal, DO  Subjective: Phillip Clark is a 55 y.o. male who was seen and examined at bedside at approximately 1110am Chest pain for approximately 15 minutes yesterday night that resolved without intervention. Currently remains pain free He denies any shortness of breath, lower extremity swelling, orthopnea or paroxysmal nocturnal dyspnea.  Objective: Vital Signs in the last 24 hours: Temp:  [98 F (36.7 C)-98.3 F (36.8 C)] 98.3 F (36.8 C) (03/20 1139) Pulse Rate:  [65-77] 77 (03/20 1139) Resp:  [11-20] 20 (03/20 1139) BP: (121-135)/(68-79) 126/79 (03/20 1139) SpO2:  [94 %-99 %] 97 % (03/20 1139) Weight:  [100.4 kg] 100.4 kg (03/20 0500)  Intake/Output:  Intake/Output Summary (Last 24 hours) at 10/01/2020 1313 Last data filed at 09/30/2020 1735 Gross per 24 hour  Intake 240 ml  Output --  Net 240 ml    Net IO Since Admission: 720 mL [10/01/20 1313]  Weights:  Filed Weights   09/29/20 1419 09/30/20 0609 10/01/20 0500  Weight: 100.7 kg 101.8 kg 100.4 kg    Telemetry: Personally reviewed.  Normal sinus rhythm without significant ectopy  Physical examination: PHYSICAL EXAM: Vitals with BMI 10/01/2020 10/01/2020 10/01/2020  Height - - -  Weight - - 221 lbs 5 oz  BMI - - 30.88  Systolic 126 124 -  Diastolic 79 68 -  Pulse 77 65 -    CONSTITUTIONAL: Well-developed and well-nourished. No acute distress.  SKIN: Skin is warm and dry. No rash noted. No cyanosis. No pallor. No jaundice HEAD: Normocephalic and atraumatic.  EYES: No scleral icterus MOUTH/THROAT: Moist oral membranes.  NECK: No JVD present. No thyromegaly noted. No carotid bruits  LYMPHATIC: No visible cervical adenopathy.  CHEST Normal respiratory effort. No intercostal retractions  LUNGS:  Clear to auscultation bilaterally.  No stridor. No wheezes. No rales.  CARDIOVASCULAR: Regular rate and rhythm, positive S1-S2, no murmurs rubs or gallops appreciated. ABDOMINAL: No apparent ascites.  EXTREMITIES: No peripheral edema  HEMATOLOGIC: No significant bruising NEUROLOGIC: Oriented to person, place, and time. Nonfocal. Normal muscle tone.  PSYCHIATRIC: Normal mood and affect. Normal behavior. Cooperative  Lab Results: Hematology Recent Labs  Lab 09/29/20 1535 10/01/20 0428  WBC 6.0 5.3  RBC 4.34 4.46  HGB 13.2 13.6  HCT 38.3* 38.6*  MCV 88.2 86.5  MCH 30.4 30.5  MCHC 34.5 35.2  RDW 11.9 11.8  PLT 275 282    Chemistry Recent Labs  Lab 09/29/20 2042 09/30/20 0441 10/01/20 0428  NA 134* 137 135  K 3.9 4.3 4.3  CL 103 103 105  CO2 25 26 24  GLUCOSE 198* 116* 119*  BUN 16 16 15  CREATININE 1.00 0.99 1.04  CALCIUM 8.7* 8.8* 9.0  GFRNONAA >60 >60 >60  ANIONGAP 6 8 6     Cardiac Enzymes: Cardiac Panel (last 3 results) Recent Labs    09/29/20 1535 09/29/20 1805  TROPONINIHS 3 4    BNP (last 3 results) Recent Labs    09/29/20 2205  BNP 30.4    ProBNP (last 3 results) No results for input(s): PROBNP in the last 8760 hours.   DDimer No results for input(s): DDIMER in the last 168 hours.   Hemoglobin A1c: No results found for: HGBA1C, MPG  TSH No results for input(s): TSH in the last 8760 hours.  Lipid Panel       Component Value Date/Time   CHOL 249 (H) 08/07/2020 0810   TRIG 175 (H) 08/07/2020 0810   HDL 37 (L) 08/07/2020 0810   LDLCALC 179 (H) 08/07/2020 0810   LDLDIRECT 182 (H) 08/07/2020 0810    Imaging: Portable chest x-ray 1 view  Result Date: 09/29/2020 CLINICAL DATA:  Unstable angina EXAM: PORTABLE CHEST 1 VIEW COMPARISON:  None. FINDINGS: Heart and mediastinal contours are within normal limits. No focal opacities or effusions. No acute bony abnormality. IMPRESSION: No active disease. Electronically Signed   By: Kevin  Dover M.D.    On: 09/29/2020 20:58   ECHOCARDIOGRAM COMPLETE  Result Date: 09/30/2020    ECHOCARDIOGRAM REPORT   Patient Name:   Phillip Clark Date of Exam: 09/30/2020 Medical Rec #:  [id number]      Height:       71.0 in Accession #:    [id number]     Weight:       224.5 lb Date of Birth:  11/07/1965      BSA:          2.215 m Patient Age:    55 years       BP:           110/69 mmHg Patient Gender: M              HR:           68 bpm. Exam Location:  Inpatient Procedure: 2D Echo Indications:     Chest Pain R07.9  History:         Patient has prior history of Echocardiogram examinations, most                  recent 08/09/2020. Arrythmias:PVC; Risk Factors:Former Smoker,                  Dyslipidemia and Hypertension. Dyspnea on exertion, Unstable                  angina.  Sonographer:     Bernard White RCS Referring Phys:  [id number] Azaria Bartell Diagnosing Phys: Gillis Boardley DO IMPRESSIONS  1. Left ventricular ejection fraction, by estimation, is 60 to 65%. The left ventricle has normal function. The left ventricle has no regional wall motion abnormalities. Left ventricular diastolic parameters were normal.  2. Right ventricular systolic function is normal. The right ventricular size is normal.  3. The mitral valve is normal in structure. No evidence of mitral valve regurgitation. No evidence of mitral stenosis.  4. The aortic valve is normal in structure. Aortic valve regurgitation is not visualized. No aortic stenosis is present.  5. There is mild dilatation of the aortic root, measuring 40 mm.  6. The inferior vena cava is normal in size with greater than 50% respiratory variability, suggesting right atrial pressure of 3 mmHg. Comparison(s): No prior Echocardiogram. FINDINGS  Left Ventricle: Left ventricular ejection fraction, by estimation, is 60 to 65%. The left ventricle has normal function. The left ventricle has no regional wall motion abnormalities. The left ventricular internal cavity size was normal in size. There is   no left ventricular hypertrophy. Left ventricular diastolic parameters were normal. Right Ventricle: The right ventricular size is normal. No increase in right ventricular wall thickness. Right ventricular systolic function is normal. Left Atrium: Left atrial size was normal in size. Right Atrium: Right atrial size was normal in size. Pericardium: There is no evidence of pericardial effusion. Mitral Valve: The mitral valve is normal in structure. No evidence of mitral   valve regurgitation. No evidence of mitral valve stenosis. Tricuspid Valve: The tricuspid valve is normal in structure. Tricuspid valve regurgitation is not demonstrated. No evidence of tricuspid stenosis. Aortic Valve: The aortic valve is normal in structure. Aortic valve regurgitation is not visualized. No aortic stenosis is present. Pulmonic Valve: The pulmonic valve was normal in structure. Pulmonic valve regurgitation is trivial. No evidence of pulmonic stenosis. Aorta: The ascending aorta was not well visualized. There is mild dilatation of the aortic root, measuring 40 mm. Venous: The inferior vena cava is normal in size with greater than 50% respiratory variability, suggesting right atrial pressure of 3 mmHg. IAS/Shunts: The interatrial septum appears to be lipomatous. The atrial septum is grossly normal.  LEFT VENTRICLE PLAX 2D LVIDd:         5.00 cm  Diastology LVIDs:         3.00 cm  LV e' medial:    5.77 cm/s LV PW:         1.10 cm  LV E/e' medial:  9.2 LV IVS:        0.90 cm  LV e' lateral:   8.05 cm/s LVOT diam:     2.20 cm  LV E/e' lateral: 6.6 LV SV:         78 LV SV Index:   35 LVOT Area:     3.80 cm  RIGHT VENTRICLE RV S prime:     14.60 cm/s TAPSE (M-mode): 2.4 cm LEFT ATRIUM             Index       RIGHT ATRIUM           Index LA diam:        4.20 cm 1.90 cm/m  RA Area:     20.90 cm LA Vol (A2C):   46.5 ml 21.00 ml/m RA Volume:   61.30 ml  27.68 ml/m LA Vol (A4C):   44.1 ml 19.91 ml/m LA Biplane Vol: 47.0 ml 21.22 ml/m   AORTIC VALVE LVOT Vmax:   111.00 cm/s LVOT Vmean:  70.600 cm/s LVOT VTI:    0.205 m  AORTA Ao Root diam: 4.00 cm MITRAL VALVE MV Area (PHT): 2.60 cm    SHUNTS MV Decel Time: 292 msec    Systemic VTI:  0.20 m MV E velocity: 53.30 cm/s  Systemic Diam: 2.20 cm MV A velocity: 72.80 cm/s MV E/A ratio:  0.73 Dorraine Ellender DO Electronically signed by Alexanderjames Berg DO Signature Date/Time: 09/30/2020/3:37:52 PM    Final    CARDIAC DATABASE: EKG: 09/29/2020 normal sinus rhythm, 68 bpm, normal axis, occasional premature ventricular contractions, without underlying injury pattern. 09/30/2020 EKG illustrates normal sinus rhythm, incomplete right bundle branch block, without underlying ischemia or injury pattern, and PVCs have resolved since prior EKG.  Echocardiogram: 09/30/2020: LVEF 60-65%, no regional wall motion abnormalities, normal diastolic filling pattern, normal right ventricular systolic size and function, mild dilatation of the aortic root, 40 mm.  No significant valvular heart disease.  Stress Testing:  None  Heart Catheterization: None  CCTA 07/2020: Coronary calcium score of 534. This was 98th percentile for age and sex matched control. Normal coronary origin with right dominance. CAD-RADS = 3. Moderate stenosis within mid to distal LAD, Moderate stenosis within proximal and distal RCA, Moderate stenosis within OM1 branch. Aortic atherosclerosis. Study is sent for CT-FFR and findings will be performed and reported separately. CT FFR analysis showed no significant stenosis.  External Labs: Collected: 06/29/2020, Care Everywhere [facility] Hemoglobin 14.1 g/dL, hematocrit 40.9% Creatinine 0.94 mg/dL. eGFR: >90 mL/min per 1.73 m Lipid profile: Total cholesterol 239, triglycerides 130, HDL 45, LDL 199, non-HDL 194 AST 18, ALT 23, alkaline phosphatase 60  Scheduled Meds: . aspirin EC  81 mg Oral Daily  . atorvastatin  40 mg Oral QHS  . enoxaparin (LOVENOX)  injection  1 mg/kg Subcutaneous Q12H  . metoprolol succinate  100 mg Oral q AM  . multivitamin with minerals  1 tablet Oral Daily   PRN Meds: acetaminophen, nitroGLYCERIN, ondansetron (ZOFRAN) IV   IMPRESSION & RECOMMENDATIONS: Isao Seltzer is a 55 y.o. male whose past medical history and cardiac risk factors include: Severe coronary artery calcification, moderate coronary artery disease per coronary CTA, family history of premature coronary artery disease, hypercholesterolemia, former smoker, intolerant to statin therapy in the past.  Impression: Unstable angina: Improving Dyspnea on exertion: Resolved Premature ventricular contractions: Improving Hypercholesterolemia, pure. Severe coronary artery calcification, total coronary artery calcium score 534 AU.  Moderate nonobstructive CAD, prior coronary CTA. Aortic atherosclerosis Family history of premature CAD. Former smoker.  Recommendations: 1 episode of chest pain yesterday night approximately 15 minutes in duration which is self-limited.  Currently chest pain-free.  On Lovenox per ACS protocol, beta-blocker therapy, aspirin and statin therapy.  2D echocardiogram notes preserved LVEF, no regional wall motion abnormalities, and no significant valvular heart disease.  When he had his coronary CTA he did not have any significant stenosis within the LAD, LCx or RCA distribution.  However his OM1 distal vessel CT FFR was 0.79.  At that time the shared decision was to proceed with lifestyle modifications and increasing GDMT and also addressing his lipids.  However, he presents to the hospital now with effort related chest pain and shortness of breath that has been going on since last week Wednesday.  This shared decision was to proceed with left heart catheterization with possible intervention. The procedure of left heart catheterization with possible intervention was explained to the patient in detail.  The indication, alternatives,  risks and benefits were reviewed.  Complications include but not limited to bleeding, infection, vascular injury, stroke, myocardial infection, arrhythmia, kidney injury requiring hemodialysis, radiation-related injury in the case of prolonged fluoroscopy use, emergency cardiac surgery, and death. The patient understands the risks of serious complication is 1-2 in 8416 with diagnostic cardiac cath and 1-2% or less with angioplasty/stenting.  The patient voices understanding and provides verbal feedback and wishes to proceed with coronary angiography with possible PCI.  Check fasting lipid profile in the morning.  Patient's questions and concerns were addressed to his satisfaction. He voices understanding of the instructions provided during this encounter.   This note was created using a voice recognition software as a result there may be grammatical errors inadvertently enclosed that do not reflect the nature of this encounter. Every attempt is made to correct such errors.  Total time spent: 25 minutes  Oona Trammel [place], DO, [facility]. [place] Office: [phone] 10/01/2020, 1:13 PM

## 2020-10-02 ENCOUNTER — Encounter (HOSPITAL_COMMUNITY): Payer: Self-pay | Admitting: Cardiology

## 2020-10-02 ENCOUNTER — Encounter (HOSPITAL_COMMUNITY)
Admission: AD | Disposition: A | Discharge status: Home / Self Care | Payer: Self-pay | Source: Ambulatory Visit | Attending: Cardiology

## 2020-10-02 DIAGNOSIS — I2510 Atherosclerotic heart disease of native coronary artery without angina pectoris: Secondary | ICD-10-CM | POA: Diagnosis not present

## 2020-10-02 HISTORY — PX: LEFT HEART CATH AND CORONARY ANGIOGRAPHY: CATH118249

## 2020-10-02 LAB — LIPID PANEL
Cholesterol: 159 mg/dL (ref 0–200)
HDL: 32 mg/dL — ABNORMAL LOW (ref >40)
LDL Cholesterol: 110 mg/dL — ABNORMAL HIGH (ref 0–99)
Total CHOL/HDL Ratio: 5 RATIO
Triglycerides: 87 mg/dL (ref <150)
VLDL: 17 mg/dL (ref 0–40)

## 2020-10-02 LAB — BASIC METABOLIC PANEL
Anion gap: 6 (ref 5–15)
BUN: 14 mg/dL (ref 6–20)
CO2: 26 mmol/L (ref 22–32)
Calcium: 8.9 mg/dL (ref 8.9–10.3)
Chloride: 103 mmol/L (ref 98–111)
Creatinine, Ser: 0.97 mg/dL (ref 0.61–1.24)
GFR, Estimated: >60 mL/min (ref >60)
Glucose, Bld: 115 mg/dL — ABNORMAL HIGH (ref 70–99)
Potassium: 4.1 mmol/L (ref 3.5–5.1)
Sodium: 135 mmol/L (ref 135–145)

## 2020-10-02 LAB — CBC
HCT: 38.7 % — ABNORMAL LOW (ref 39.0–52.0)
Hemoglobin: 13.7 g/dL (ref 13.0–17.0)
MCH: 30.6 pg (ref 26.0–34.0)
MCHC: 35.4 g/dL (ref 30.0–36.0)
MCV: 86.4 fL (ref 80.0–100.0)
Platelets: 272 10*3/uL (ref 150–400)
RBC: 4.48 MIL/uL (ref 4.22–5.81)
RDW: 11.9 % (ref 11.5–15.5)
WBC: 5.4 10*3/uL (ref 4.0–10.5)
nRBC: 0 % (ref 0.0–0.2)

## 2020-10-02 LAB — MAGNESIUM: Magnesium: 2.1 mg/dL (ref 1.7–2.4)

## 2020-10-02 SURGERY — LEFT HEART CATH AND CORONARY ANGIOGRAPHY
Anesthesia: LOCAL

## 2020-10-02 MED ORDER — ONDANSETRON HCL 4 MG/2ML IJ SOLN
4.0000 mg | Freq: Four times a day (QID) | INTRAMUSCULAR | Status: DC | PRN
Start: 2020-10-02 — End: 2020-10-02

## 2020-10-02 MED ORDER — SODIUM CHLORIDE 0.9 % IV SOLN: INTRAVENOUS | Status: AC

## 2020-10-02 MED ORDER — VERAPAMIL HCL 2.5 MG/ML IV SOLN
INTRAVENOUS | Status: DC | PRN
  Administered 2020-10-02: 10 mL via INTRA_ARTERIAL

## 2020-10-02 MED ORDER — VERAPAMIL HCL 2.5 MG/ML IV SOLN
INTRAVENOUS | Status: AC
  Filled 2020-10-02: qty 2

## 2020-10-02 MED ORDER — FENTANYL CITRATE (PF) 100 MCG/2ML IJ SOLN
INTRAMUSCULAR | Status: DC | PRN
  Administered 2020-10-02: 50 ug via INTRAVENOUS

## 2020-10-02 MED ORDER — MIDAZOLAM HCL 2 MG/2ML IJ SOLN
INTRAMUSCULAR | Status: AC
  Filled 2020-10-02: qty 2

## 2020-10-02 MED ORDER — HYDRALAZINE HCL 20 MG/ML IJ SOLN: 10.0000 mg | INTRAMUSCULAR | Status: AC | PRN

## 2020-10-02 MED ORDER — HEPARIN (PORCINE) IN NACL 1000-0.9 UT/500ML-% IV SOLN
INTRAVENOUS | Status: AC
  Filled 2020-10-02: qty 1000

## 2020-10-02 MED ORDER — MIDAZOLAM HCL 2 MG/2ML IJ SOLN
INTRAMUSCULAR | Status: DC | PRN
  Administered 2020-10-02: 1 mg via INTRAVENOUS

## 2020-10-02 MED ORDER — LABETALOL HCL 5 MG/ML IV SOLN: 10.0000 mg | INTRAVENOUS | Status: AC | PRN

## 2020-10-02 MED ORDER — HEPARIN (PORCINE) IN NACL 1000-0.9 UT/500ML-% IV SOLN
INTRAVENOUS | Status: DC | PRN
  Administered 2020-10-02 (×2): 500 mL

## 2020-10-02 MED ORDER — IOHEXOL 350 MG/ML SOLN
INTRAVENOUS | Status: DC | PRN
  Administered 2020-10-02: 35 mL via INTRA_ARTERIAL

## 2020-10-02 MED ORDER — LIDOCAINE HCL (PF) 1 % IJ SOLN
INTRAMUSCULAR | Status: AC
  Filled 2020-10-02: qty 30

## 2020-10-02 MED ORDER — ACETAMINOPHEN 325 MG PO TABS: 650.0000 mg | ORAL_TABLET | ORAL | Status: DC | PRN

## 2020-10-02 MED ORDER — HEPARIN SODIUM (PORCINE) 1000 UNIT/ML IJ SOLN
INTRAMUSCULAR | Status: DC | PRN
  Administered 2020-10-02: 5000 [IU] via INTRAVENOUS

## 2020-10-02 MED ORDER — SODIUM CHLORIDE 0.9 % IV SOLN: 250.0000 mL | INTRAVENOUS | Status: DC | PRN

## 2020-10-02 MED ORDER — LIDOCAINE HCL (PF) 1 % IJ SOLN
INTRAMUSCULAR | Status: DC | PRN
  Administered 2020-10-02: 2 mL

## 2020-10-02 MED ORDER — SODIUM CHLORIDE 0.9% FLUSH
3.0000 mL | Freq: Two times a day (BID) | INTRAVENOUS | Status: DC
  Administered 2020-10-02: 3 mL via INTRAVENOUS

## 2020-10-02 MED ORDER — SODIUM CHLORIDE 0.9% FLUSH: 3.0000 mL | INTRAVENOUS | Status: DC | PRN

## 2020-10-02 MED ORDER — FENTANYL CITRATE (PF) 100 MCG/2ML IJ SOLN
INTRAMUSCULAR | Status: AC
  Filled 2020-10-02: qty 2

## 2020-10-02 SURGICAL SUPPLY — 12 items
CATH INFINITI 5 FR JL3.5 (CATHETERS) ×2 IMPLANT
CATH LAUNCHER 6FR EBU 3 (CATHETERS) ×2 IMPLANT
CATH OPTITORQUE TIG 4.0 5F (CATHETERS) ×2 IMPLANT
DEVICE RAD COMP TR BAND LRG (VASCULAR PRODUCTS) ×2 IMPLANT
GLIDESHEATH SLEND A-KIT 6F 22G (SHEATH) ×2 IMPLANT
GUIDEWIRE INQWIRE 1.5J.035X260 (WIRE) ×1 IMPLANT
INQWIRE 1.5J .035X260CM (WIRE) ×2
KIT HEART LEFT (KITS) ×2 IMPLANT
PACK CARDIAC CATHETERIZATION (CUSTOM PROCEDURE TRAY) ×2 IMPLANT
SHEATH PROBE COVER 6X72 (BAG) ×2 IMPLANT
TRANSDUCER W/STOPCOCK (MISCELLANEOUS) ×2 IMPLANT
TUBING CIL FLEX 10 FLL-RA (TUBING) ×2 IMPLANT

## 2020-10-02 NOTE — Discharge Summary (Signed)
Physician Discharge Summary  Patient ID: Phillip Clark MRN: [id number] DOB/AGE: June 10, 1966 55 y.o.  Admit date: 09/29/2020 Discharge date: 10/02/2020  Primary Discharge Diagnosis: Coronary artery disease  Secondary Discharge Diagnosis: Hypertension Hyperlipidemia   Hospital Course:   55 y.o. Caucasian male  with hypertension, hyperlipidemia, admitted with chest pain at rest and dyspnea. ACS ruled out. Coronary angiogram details below. Continue medical management.   Discharge Exam: Blood pressure 111/72, pulse 70, temperature 97.7 F (36.5 C), temperature source Oral, resp. rate 17, height 5\' 11"  (1.803 m), weight 101 kg, SpO2 99 %.   Physical Exam Vitals and nursing note reviewed.  Constitutional:      General: He is not in acute distress.    Appearance: He is well-developed.  HENT:     Head: Normocephalic and atraumatic.  Eyes:     Conjunctiva/sclera: Conjunctivae normal.     Pupils: Pupils are equal, round, and reactive to light.  Neck:     Vascular: No JVD.  Cardiovascular:     Rate and Rhythm: Normal rate and regular rhythm.     Pulses: Normal pulses and intact distal pulses.     Heart sounds: No murmur heard.   Pulmonary:     Effort: Pulmonary effort is normal.     Breath sounds: Normal breath sounds. No wheezing or rales.  Abdominal:     General: Bowel sounds are normal.     Palpations: Abdomen is soft.     Tenderness: There is no rebound.  Musculoskeletal:        General: No tenderness. Normal range of motion.     Right lower leg: No edema.     Left lower leg: No edema.  Lymphadenopathy:     Cervical: No cervical adenopathy.  Skin:    General: Skin is warm and dry.  Neurological:     Mental Status: He is alert and oriented to person, place, and time.     Cranial Nerves: No cranial nerve deficit.      Significant Diagnostic Studies:  EKG 10/01/2020: Sinus rhythm Normal EKG  Coronary angiography 10/02/2020: LM: Normal LAD: Diffuse luminal  irregularities with prox 20%, mid 30% stenoses LCx: Prox 30%, OM1 30% stenoses  RCA: Diffuse luminal irregularities with prox, mid, and RPDA 20% stenoses  LVEDP 10 mmHg  Mild CAD, normal LVEDP Symptoms of dyspnea and chest pain at rest out of proportion to angiography findings Consider alternate etiology   Labs:   Lab Results  Component Value Date   WBC 5.4 10/02/2020   HGB 13.7 10/02/2020   HCT 38.7 (L) 10/02/2020   MCV 86.4 10/02/2020   PLT 272 10/02/2020    Recent Labs  Lab 10/02/20 0235  NA 135  K 4.1  CL 103  CO2 26  BUN 14  CREATININE 0.97  CALCIUM 8.9  GLUCOSE 115*    Lipid Panel     Component Value Date/Time   CHOL 159 10/02/2020 0235   CHOL 249 (H) 08/07/2020 0810   TRIG 87 10/02/2020 0235   HDL 32 (L) 10/02/2020 0235   HDL 37 (L) 08/07/2020 0810   CHOLHDL 5.0 10/02/2020 0235   VLDL 17 10/02/2020 0235   LDLCALC 110 (H) 10/02/2020 0235   LDLCALC 179 (H) 08/07/2020 0810    BNP (last 3 results) Recent Labs    09/29/20 2205  BNP 30.4    HEMOGLOBIN A1C No results found for: HGBA1C, MPG  Cardiac Panel (last 3 results) Recent Labs    08/07/20 0810  CKTOTAL 136  Lab Results  Component Value Date   CKTOTAL 136 08/07/2020     TSH No results for input(s): TSH in the last 8760 hours.  Radiology: CARDIAC CATHETERIZATION  Result Date: 10/02/2020 LM: Normal LAD: Diffuse luminal irregularities with prox 20%, mid 30% stenoses LCx: Prox 30%, OM1 30% stenoses RCA: Diffuse luminal irregularities with prox, mid, and RPDA 20% stenoses LVEDP 10 mmHg Mild CAD, normal LVEDP Symptoms of dyspnea and chest pain at rest out of proportion to angiography findings Consider alternate etiology Elder Negus, MD Pager: [phone] Office: [phone]   Portable chest x-ray 1 view  Result Date: 09/29/2020 CLINICAL DATA:  Unstable angina EXAM: PORTABLE CHEST 1 VIEW COMPARISON:  None. FINDINGS: Heart and mediastinal contours are within normal limits. No  focal opacities or effusions. No acute bony abnormality. IMPRESSION: No active disease. Electronically Signed   By: Charlett Nose M.D.   On: 09/29/2020 20:58   ECHOCARDIOGRAM COMPLETE  Result Date: 09/30/2020    ECHOCARDIOGRAM REPORT   Patient Name:   Phillip Clark Date of Exam: 09/30/2020 Medical Rec #:  [id number]      Height:       71.0 in Accession #:    [id number]     Weight:       224.5 lb Date of Birth:  March 09, 1966      BSA:          2.215 m Patient Age:    54 years       BP:           110/69 mmHg Patient Gender: M              HR:           68 bpm. Exam Location:  Inpatient Procedure: 2D Echo Indications:     Chest Pain R07.9  History:         Patient has prior history of Echocardiogram examinations, most                  recent 08/09/2020. Arrythmias:PVC; Risk Factors:Former Smoker,                  Dyslipidemia and Hypertension. Dyspnea on exertion, Unstable                  angina.  Sonographer:     Celesta Gentile RCS Referring Phys:  [id number] SUNIT TOLIA Diagnosing Phys: Tessa Lerner DO IMPRESSIONS  1. Left ventricular ejection fraction, by estimation, is 60 to 65%. The left ventricle has normal function. The left ventricle has no regional wall motion abnormalities. Left ventricular diastolic parameters were normal.  2. Right ventricular systolic function is normal. The right ventricular size is normal.  3. The mitral valve is normal in structure. No evidence of mitral valve regurgitation. No evidence of mitral stenosis.  4. The aortic valve is normal in structure. Aortic valve regurgitation is not visualized. No aortic stenosis is present.  5. There is mild dilatation of the aortic root, measuring 40 mm.  6. The inferior vena cava is normal in size with greater than 50% respiratory variability, suggesting right atrial pressure of 3 mmHg. Comparison(s): No prior Echocardiogram. FINDINGS  Left Ventricle: Left ventricular ejection fraction, by estimation, is 60 to 65%. The left ventricle has normal function.  The left ventricle has no regional wall motion abnormalities. The left ventricular internal cavity size was normal in size. There is  no left ventricular hypertrophy. Left ventricular diastolic parameters were normal. Right Ventricle: The right ventricular  size is normal. No increase in right ventricular wall thickness. Right ventricular systolic function is normal. Left Atrium: Left atrial size was normal in size. Right Atrium: Right atrial size was normal in size. Pericardium: There is no evidence of pericardial effusion. Mitral Valve: The mitral valve is normal in structure. No evidence of mitral valve regurgitation. No evidence of mitral valve stenosis. Tricuspid Valve: The tricuspid valve is normal in structure. Tricuspid valve regurgitation is not demonstrated. No evidence of tricuspid stenosis. Aortic Valve: The aortic valve is normal in structure. Aortic valve regurgitation is not visualized. No aortic stenosis is present. Pulmonic Valve: The pulmonic valve was normal in structure. Pulmonic valve regurgitation is trivial. No evidence of pulmonic stenosis. Aorta: The ascending aorta was not well visualized. There is mild dilatation of the aortic root, measuring 40 mm. Venous: The inferior vena cava is normal in size with greater than 50% respiratory variability, suggesting right atrial pressure of 3 mmHg. IAS/Shunts: The interatrial septum appears to be lipomatous. The atrial septum is grossly normal.  LEFT VENTRICLE PLAX 2D LVIDd:         5.00 cm  Diastology LVIDs:         3.00 cm  LV e' medial:    5.77 cm/s LV PW:         1.10 cm  LV E/e' medial:  9.2 LV IVS:        0.90 cm  LV e' lateral:   8.05 cm/s LVOT diam:     2.20 cm  LV E/e' lateral: 6.6 LV SV:         78 LV SV Index:   35 LVOT Area:     3.80 cm  RIGHT VENTRICLE RV S prime:     14.60 cm/s TAPSE (M-mode): 2.4 cm LEFT ATRIUM             Index       RIGHT ATRIUM           Index LA diam:        4.20 cm 1.90 cm/m  RA Area:     20.90 cm LA Vol (A2C):    46.5 ml 21.00 ml/m RA Volume:   61.30 ml  27.68 ml/m LA Vol (A4C):   44.1 ml 19.91 ml/m LA Biplane Vol: 47.0 ml 21.22 ml/m  AORTIC VALVE LVOT Vmax:   111.00 cm/s LVOT Vmean:  70.600 cm/s LVOT VTI:    0.205 m  AORTA Ao Root diam: 4.00 cm MITRAL VALVE MV Area (PHT): 2.60 cm    SHUNTS MV Decel Time: 292 msec    Systemic VTI:  0.20 m MV E velocity: 53.30 cm/s  Systemic Diam: 2.20 cm MV A velocity: 72.80 cm/s MV E/A ratio:  0.73 Sunit Tolia DO Electronically signed by Tessa LernerSunit Tolia DO Signature Date/Time: 09/30/2020/3:37:52 PM    Final       FOLLOW UP PLANS AND APPOINTMENTS Discharge Instructions    Diet - low sodium heart healthy   Complete by: As directed    Increase activity slowly   Complete by: As directed      Allergies as of 10/02/2020      Reactions   Crestor [rosuvastatin Calcium] Other (See Comments)   MYALGIAS   Latex    Rosuvastatin    Other reaction(s): Myalgias (intolerance)   Topiramate    Other reaction(s): Other (See Comments) Tingling legs Tingling legs   Varenicline    Other reaction(s): Other (See Comments) Suicidal idea Suicidal idea   Tape Rash   "  takes skins off"      Medication List    TAKE these medications   aspirin EC 81 MG tablet Take 1 tablet (81 mg total) by mouth daily. Swallow whole.   atorvastatin 40 MG tablet Commonly known as: LIPITOR Take 1 tablet (40 mg total) by mouth at bedtime.   Fish Oil 1000 MG Caps Take 1 capsule by mouth daily.   metoprolol succinate 50 MG 24 hr tablet Commonly known as: TOPROL-XL Take 1 tablet (50 mg total) by mouth in the morning. Take with or immediately following a meal.   MULTIVITAMIN ADULT PO Take 1 tablet by mouth daily.       Follow-up Information    Tessa Lerner, DO Follow up on 10/12/2020.   Specialties: Cardiology, Radiology, Vascular Surgery Why: 2:45 PM Contact information: [address] [place] [address] [phone]                 Elder Negus,  MD Pager: [phone] Office: [phone]

## 2020-10-02 NOTE — Plan of Care (Signed)
  Problem: Activity: Goal: Risk for activity intolerance will decrease Outcome: Progressing   Problem: Nutrition: Goal: Adequate nutrition will be maintained Outcome: Progressing   

## 2020-10-02 NOTE — Interval H&P Note (Signed)
History and Physical Interval Note:  10/02/2020 7:36 AM  Virginio Lawhorn  has presented today for surgery, with the diagnosis of unstable angina.  The various methods of treatment have been discussed with the patient and family. After consideration of risks, benefits and other options for treatment, the patient has consented to  Procedure(s): LEFT HEART CATH AND CORONARY ANGIOGRAPHY (N/A) as a surgical intervention.  The patient's history has been reviewed, patient examined, no change in status, stable for surgery.  I have reviewed the patient's chart and labs.  Questions were answered to the patient's satisfaction.    2016/2017 Appropriate Use Criteria for Coronary Revascularization Clinical Presentation: Diabetes Mellitus? Symptom Status? S/P CABG? Antianginal Therapy (# of long-acting drugs)? Results of Non-invasive testing? FFR/iFR results in all diseased vessels? Patient undergoing renal transplant? Patient undergoing percutaneous valve procedure (TAVR, MitraClip, Others)? Symptom Status:  Ischemic Symptoms  Non-invasive Testing:  Intermediate Risk  If no or indeterminate stress test, FFR/iFR results in all diseased vessels:  N/A  Diabetes Mellitus:  No  S/P CABG:  No  Antianginal therapy (number of long-acting drugs):  >=2  Patient undergoing renal transplant:  No  Patient undergoing percutaneous valve procedure:  No    newline 1 Vessel Disease PCI CABG  No proximal LAD involvement, No proximal left dominant LCX involvement A (8); Indication 2 M (6); Indication 2   Proximal left dominant LCX involvement A (8); Indication 5 A (8); Indication 5   Proximal LAD involvement A (8); Indication 5 A (8); Indication 5   newline 2 Vessel Disease  No proximal LAD involvement A (8); Indication 8 A (7); Indication 8   Proximal LAD involvement A (8); Indication 11 A (8); Indication 11   newline 3 Vessel Disease  Low disease complexity (e.g., focal stenoses, SYNTAX <=22) A (8); Indication 17 A  (8); Indication 17   Intermediate or high disease complexity (e.g., SYNTAX >=23) M (6); Indication 21 A (9); Indication 21   newline Left Main Disease  Isolated LMCA disease: ostial or midshaft A (7); Indication 24 A (9); Indication 24   Isolated LMCA disease: bifurcation involvement M (6); Indication 25 A (9); Indication 25   LMCA ostial or midshaft, concurrent low disease burden multivessel disease (e.g., 1-2 additional focal stenoses, SYNTAX <=22) A (7); Indication 26 A (9); Indication 26   LMCA ostial or midshaft, concurrent intermediate or high disease burden multivessel disease (e.g., 1-2 additional bifurcation stenoses, long stenoses, SYNTAX >=23) M (4); Indication 27 A (9); Indication 27   LMCA bifurcation involvement, concurrent low disease burden multivessel disease (e.g., 1-2 additional focal stenoses, SYNTAX <=22) M (6); Indication 28 A (9); Indication 28   LMCA bifurcation involvement, concurrent intermediate or high disease burden multivessel disease (e.g., 1-2 additional bifurcation stenoses, long stenoses, SYNTAX >=23) R (3); Indication 29 A (9); Indication 29     Notes: A indicates appropriate. M indicates may be appropriate. R indicates rarely appropriate. Number in parentheses is median score for that indication. Reclassify indicates number of functionally diseased vessels should be decreased given negative FFR/iFR. Re-evaluate the scenario interpreting any FFR/iFR negative vessel as being not significantly stenosed. Disease means involved vessel provides flow to a sufficient amount of myocardium to be clinically important. If FFR testing indicates a vessel is not significant, that vessel should not be considered diseased (and the patient should be reclassified with respect to extent of functionally significant disease). Proximal LAD + proximal left dominant LCX is considered 3 vessel CAD 2 Vessel CAD with FFR/iFR  abnormal in only 1 but not both is considered 1 vessel CAD Disease  complexity includes occlusion, bifurcation, trifurcation, ostial, >20 mm, tortuosity, calcification, thrombus LMCA disease is >=50% by angiography, MLD <2.8 mm, MLA <6 mm2; MLA 6-7.5 mm2 requires further physiologic See Table B for risk stratification based on noninvasive testing    Sakina Briones J Declin Rajan

## 2020-10-10 LAB — COMP. METABOLIC PANEL (12)
AST: 21 IU/L (ref 0–40)
Albumin/Globulin Ratio: 1.6 (ref 1.2–2.2)
Albumin: 4.2 g/dL (ref 3.8–4.9)
Alkaline Phosphatase: 76 IU/L (ref 44–121)
BUN/Creatinine Ratio: 14 (ref 9–20)
BUN: 15 mg/dL (ref 6–24)
Bilirubin Total: 0.3 mg/dL (ref 0.0–1.2)
Calcium: 9.2 mg/dL (ref 8.7–10.2)
Chloride: 103 mmol/L (ref 96–106)
Creatinine, Ser: 1.05 mg/dL (ref 0.76–1.27)
Globulin, Total: 2.6 g/dL (ref 1.5–4.5)
Glucose: 125 mg/dL — ABNORMAL HIGH (ref 65–99)
Potassium: 4.8 mmol/L (ref 3.5–5.2)
Sodium: 140 mmol/L (ref 134–144)
Total Protein: 6.8 g/dL (ref 6.0–8.5)
eGFR: 85 mL/min/{1.73_m2} (ref >59)

## 2020-10-10 LAB — LIPID PANEL WITH LDL/HDL RATIO
Cholesterol, Total: 153 mg/dL (ref 100–199)
HDL: 37 mg/dL — ABNORMAL LOW (ref >39)
LDL Chol Calc (NIH): 96 mg/dL (ref 0–99)
LDL/HDL Ratio: 2.6 ratio (ref 0.0–3.6)
Triglycerides: 110 mg/dL (ref 0–149)
VLDL Cholesterol Cal: 20 mg/dL (ref 5–40)

## 2020-10-10 LAB — LDL CHOLESTEROL, DIRECT: LDL Direct: 92 mg/dL (ref 0–99)
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
## 2020-10-12 ENCOUNTER — Encounter: Payer: Self-pay | Admitting: Cardiology

## 2020-10-12 ENCOUNTER — Ambulatory Visit: Payer: BC Managed Care – PPO | Admitting: Cardiology

## 2020-10-12 ENCOUNTER — Other Ambulatory Visit: Payer: Self-pay

## 2020-10-12 VITALS — BP 134/80 | HR 83 | Temp 98.5°F | Resp 16 | Ht 71.0 in | Wt 230.8 lb

## 2020-10-12 DIAGNOSIS — E7800 Pure hypercholesterolemia, unspecified: Secondary | ICD-10-CM

## 2020-10-12 DIAGNOSIS — I2584 Coronary atherosclerosis due to calcified coronary lesion: Secondary | ICD-10-CM

## 2020-10-12 DIAGNOSIS — Z87891 Personal history of nicotine dependence: Secondary | ICD-10-CM

## 2020-10-12 DIAGNOSIS — R072 Precordial pain: Secondary | ICD-10-CM

## 2020-10-12 DIAGNOSIS — R0602 Shortness of breath: Secondary | ICD-10-CM

## 2020-10-12 DIAGNOSIS — Z8249 Family history of ischemic heart disease and other diseases of the circulatory system: Secondary | ICD-10-CM

## 2020-10-12 DIAGNOSIS — I2510 Atherosclerotic heart disease of native coronary artery without angina pectoris: Secondary | ICD-10-CM

## 2020-10-12 MED ORDER — ATORVASTATIN CALCIUM 20 MG PO TABS: 20.0000 mg | ORAL_TABLET | Freq: Every evening | ORAL | 0 refills | Status: DC

## 2020-11-20 ENCOUNTER — Other Ambulatory Visit: Payer: Self-pay

## 2020-11-20 DIAGNOSIS — R072 Precordial pain: Secondary | ICD-10-CM

## 2020-11-20 MED ORDER — METOPROLOL SUCCINATE ER 50 MG PO TB24: 50.0000 mg | ORAL_TABLET | Freq: Every morning | ORAL | 3 refills | Status: DC

## 2021-01-08 ENCOUNTER — Other Ambulatory Visit: Payer: Self-pay

## 2021-01-08 ENCOUNTER — Emergency Department (HOSPITAL_COMMUNITY)
Admission: EM | Admit: 2021-01-08 | Discharge: 2021-01-08 | Disposition: A | Discharge status: Home / Self Care | Payer: BC Managed Care – PPO | Attending: Emergency Medicine | Admitting: Emergency Medicine

## 2021-01-08 ENCOUNTER — Encounter (HOSPITAL_COMMUNITY): Payer: Self-pay | Admitting: Emergency Medicine

## 2021-01-08 DIAGNOSIS — Z7982 Long term (current) use of aspirin: Secondary | ICD-10-CM | POA: Diagnosis not present

## 2021-01-08 DIAGNOSIS — L03115 Cellulitis of right lower limb: Secondary | ICD-10-CM | POA: Insufficient documentation

## 2021-01-08 DIAGNOSIS — Z79899 Other long term (current) drug therapy: Secondary | ICD-10-CM | POA: Insufficient documentation

## 2021-01-08 DIAGNOSIS — Z91040 Latex allergy status: Secondary | ICD-10-CM | POA: Insufficient documentation

## 2021-01-08 DIAGNOSIS — Z87891 Personal history of nicotine dependence: Secondary | ICD-10-CM | POA: Diagnosis not present

## 2021-01-08 DIAGNOSIS — I10 Essential (primary) hypertension: Secondary | ICD-10-CM | POA: Diagnosis not present

## 2021-01-08 MED ORDER — DOXYCYCLINE HYCLATE 100 MG PO TABS
100.0000 mg | ORAL_TABLET | Freq: Once | ORAL | Status: AC
  Administered 2021-01-08: 100 mg via ORAL
  Filled 2021-01-08: qty 1

## 2021-01-08 MED ORDER — DOXYCYCLINE HYCLATE 100 MG PO CAPS: 100.0000 mg | ORAL_CAPSULE | Freq: Two times a day (BID) | ORAL | 0 refills | Status: AC

## 2021-01-08 NOTE — ED Triage Notes (Signed)
Pt arrives POV with complaints of what seems to be a spider bite on R thigh. Pt states bite is very itchy.

## 2021-01-08 NOTE — Discharge Instructions (Addendum)
You are diagnosed with cellulitis or skin infection of your lower leg.  This may have been due to an insect sting or else skin breakdown and infection from the bacteria in your skin.  I put you on doxycycline which is an antibiotic that covers for staph and other infections.  You will need to complete the full course of antibiotics.  If the redness is continuing to spread after 48 hours (2 days of treatment), or you begin having fevers, or begin to feel lightheaded, you need to come back to the ER.  These may be signs of worsening or failed infection.  Otherwise you can schedule follow-up appointment with your primary care doctor's office in 3-5 days to have your wound looked at again.

## 2021-01-08 NOTE — ED Provider Notes (Signed)
[facility]-EMERGENCY DEPT Provider Note   CSN: [id number] Arrival date & time: 01/08/21  1411     History Chief Complaint  Patient presents with   Insect Bite    Phillip Clark is a 55 y.o. male presenting to ED with concern for insect bite to his leg.  He reports a region of itchiness and redness on the underside of his right thigh that began a few days ago.   He's concerned it may be a spider bite, which he has had in the past.  He is not on antibiotics.  Denies fevers or chills.    HPI     Past Medical History:  Diagnosis Date   Coronary artery calcification of native artery    Essential tremor    Hyperlipidemia    Hypertension    Nonobstructive atherosclerosis of coronary artery     Patient Active Problem List   Diagnosis Date Noted   Unstable angina (HCC) 09/29/2020   Pure hypercholesterolemia 09/29/2020   Premature ventricular contractions    Nonobstructive atherosclerosis of coronary artery    Atherosclerosis of aorta (HCC)    Former smoker    Family history of premature CAD    Precordial pain    Dyspnea on exertion     Past Surgical History:  Procedure Laterality Date   HERNIA REPAIR     LEFT HEART CATH AND CORONARY ANGIOGRAPHY N/A 10/02/2020   Procedure: LEFT HEART CATH AND CORONARY ANGIOGRAPHY;  Surgeon: Elder Negus, MD;  Location: [facility];  Service: Cardiovascular;  Laterality: N/A;   VASECTOMY         Family History  Problem Relation Age of Onset   Hypertension Mother    Hyperlipidemia Mother    Diabetes Mother    Thyroid disease Mother    Heart attack Father    Hypertension Father    Hyperlipidemia Father    Hypertension Sister    Hyperlipidemia Sister    Hypertension Brother    Hyperlipidemia Brother    Hyperlipidemia Sister    Hyperlipidemia Sister    Hyperlipidemia Sister     Social History   Tobacco Use   Smoking status: Former    Packs/day: 1.00    Years: 34.00    Pack years: 34.00     Types: Cigarettes    Quit date: 2018    Years since quitting: 4.4   Smokeless tobacco: Never  Vaping Use   Vaping Use: Never used  Substance Use Topics   Alcohol use: Not Currently   Drug use: Never    Home Medications Prior to Admission medications   Medication Sig Start Date End Date Taking? Authorizing Provider  doxycycline (VIBRAMYCIN) 100 MG capsule Take 1 capsule (100 mg total) by mouth 2 (two) times daily for 7 days. 01/08/21 01/15/21 Yes Terald Sleeper, MD  aspirin EC 81 MG tablet Take 1 tablet (81 mg total) by mouth daily. Swallow whole. 08/22/20   Tolia, Sunit, DO  atorvastatin (LIPITOR) 20 MG tablet Take 1 tablet (20 mg total) by mouth at bedtime. 10/12/20 01/10/21  Tolia, Sunit, DO  metoprolol succinate (TOPROL-XL) 50 MG 24 hr tablet Take 1 tablet (50 mg total) by mouth in the morning. Take with or immediately following a meal. 11/20/20 02/18/21  Tolia, Sunit, DO  Multiple Vitamin (MULTIVITAMIN ADULT PO) Take 1 tablet by mouth daily.    [provider]  Omega-3 Fatty Acids (FISH OIL) 1000 MG CAPS Take 1 capsule by mouth daily.  [provider]    Allergies    Crestor [rosuvastatin calcium], Latex, Rosuvastatin, Topiramate, Varenicline, and Tape  Review of Systems   Review of Systems  Constitutional:  Negative for chills and fever.  Cardiovascular:  Negative for chest pain and palpitations.  Gastrointestinal:  Negative for abdominal pain and vomiting.  Skin:  Positive for rash and wound.   Physical Exam Updated Vital Signs BP 135/78   Pulse 79   Temp 98.6 F (37 C) (Oral)   Resp 18   SpO2 99%   Physical Exam Constitutional:      General: He is not in acute distress. HENT:     Head: Normocephalic and atraumatic.  Eyes:     Conjunctiva/sclera: Conjunctivae normal.     Pupils: Pupils are equal, round, and reactive to light.  Cardiovascular:     Rate and Rhythm: Normal rate and regular rhythm.  Pulmonary:     Effort: Pulmonary effort is normal.  No respiratory distress.  Skin:    General: Skin is warm and dry.     Comments: Circular region of erythema on posterior right upper thigh, with small region of skin breakdown in center.  No induration or fluctuance, no drainage.  Neurological:     General: No focal deficit present.     Mental Status: He is alert. Mental status is at baseline.  Psychiatric:        Mood and Affect: Mood normal.        Behavior: Behavior normal.    ED Results / Procedures / Treatments   Labs (all labs ordered are listed, but only abnormal results are displayed) Labs Reviewed - No data to display  EKG None  Radiology No results found.  Procedures Procedures   Medications Ordered in ED Medications  doxycycline (VIBRA-TABS) tablet 100 mg (100 mg Oral Given 01/08/21 1459)    ED Course  I have reviewed the triage vital signs and the nursing notes.  Pertinent labs & imaging results that were available during my care of the patient were reviewed by me and considered in my medical decision making (see chart for details).  55 yo male here with possible insect sting or spider bite to right leg.  This was unwitnessed.  He does appear to have developed a cellulitis on his right leg.  It is itchy, red and superficial.  I doubt this is a DVT, abscess, or deep space infection.  No systemic signs of sepsis or infection.  We will start him on doxycycline for 7 days.  Return precautions discussed for signs of worsening infection or pain. He can follow up with PCP for wound recheck this week.     Final Clinical Impression(s) / ED Diagnoses Final diagnoses:  Cellulitis of right lower extremity    Rx / DC Orders ED Discharge Orders          Ordered    doxycycline (VIBRAMYCIN) 100 MG capsule  2 times daily        01/08/21 1453             Terald Sleeper, MD 01/08/21 1844

## 2021-01-31 ENCOUNTER — Other Ambulatory Visit: Payer: Self-pay

## 2021-01-31 DIAGNOSIS — I2510 Atherosclerotic heart disease of native coronary artery without angina pectoris: Secondary | ICD-10-CM

## 2021-01-31 DIAGNOSIS — E7800 Pure hypercholesterolemia, unspecified: Secondary | ICD-10-CM

## 2021-01-31 MED ORDER — ATORVASTATIN CALCIUM 20 MG PO TABS: 20.0000 mg | ORAL_TABLET | Freq: Every evening | ORAL | 0 refills | Status: DC

## 2021-02-06 ENCOUNTER — Other Ambulatory Visit: Payer: Self-pay | Admitting: Cardiology

## 2021-02-06 ENCOUNTER — Other Ambulatory Visit: Payer: Self-pay

## 2021-02-06 DIAGNOSIS — I2510 Atherosclerotic heart disease of native coronary artery without angina pectoris: Secondary | ICD-10-CM

## 2021-02-06 DIAGNOSIS — I2584 Coronary atherosclerosis due to calcified coronary lesion: Secondary | ICD-10-CM

## 2021-02-06 DIAGNOSIS — R072 Precordial pain: Secondary | ICD-10-CM

## 2021-02-06 DIAGNOSIS — E7800 Pure hypercholesterolemia, unspecified: Secondary | ICD-10-CM

## 2021-02-06 MED ORDER — METOPROLOL SUCCINATE ER 50 MG PO TB24: 50.0000 mg | ORAL_TABLET | Freq: Every morning | ORAL | 3 refills | Status: DC

## 2021-02-06 MED ORDER — ATORVASTATIN CALCIUM 20 MG PO TABS: 20.0000 mg | ORAL_TABLET | Freq: Every evening | ORAL | 0 refills | Status: DC

## 2021-02-09 ENCOUNTER — Other Ambulatory Visit: Payer: Self-pay

## 2021-02-09 ENCOUNTER — Emergency Department (HOSPITAL_COMMUNITY)
Admission: EM | Admit: 2021-02-09 | Discharge: 2021-02-10 | Disposition: A | Discharge status: Home / Self Care | Payer: BC Managed Care – PPO | Attending: Emergency Medicine | Admitting: Emergency Medicine

## 2021-02-09 DIAGNOSIS — Z87891 Personal history of nicotine dependence: Secondary | ICD-10-CM | POA: Diagnosis not present

## 2021-02-09 DIAGNOSIS — Z91040 Latex allergy status: Secondary | ICD-10-CM | POA: Diagnosis not present

## 2021-02-09 DIAGNOSIS — I2510 Atherosclerotic heart disease of native coronary artery without angina pectoris: Secondary | ICD-10-CM | POA: Diagnosis not present

## 2021-02-09 DIAGNOSIS — R1032 Left lower quadrant pain: Secondary | ICD-10-CM | POA: Diagnosis present

## 2021-02-09 DIAGNOSIS — I10 Essential (primary) hypertension: Secondary | ICD-10-CM | POA: Insufficient documentation

## 2021-02-09 DIAGNOSIS — Z79899 Other long term (current) drug therapy: Secondary | ICD-10-CM | POA: Diagnosis not present

## 2021-02-09 DIAGNOSIS — Z955 Presence of coronary angioplasty implant and graft: Secondary | ICD-10-CM | POA: Insufficient documentation

## 2021-02-09 DIAGNOSIS — Z7982 Long term (current) use of aspirin: Secondary | ICD-10-CM | POA: Diagnosis not present

## 2021-02-09 DIAGNOSIS — R739 Hyperglycemia, unspecified: Secondary | ICD-10-CM

## 2021-02-09 LAB — COMPREHENSIVE METABOLIC PANEL
ALT: 27 U/L (ref 0–44)
AST: 22 U/L (ref 15–41)
Albumin: 3.9 g/dL (ref 3.5–5.0)
Alkaline Phosphatase: 63 U/L (ref 38–126)
Anion gap: 9 (ref 5–15)
BUN: 16 mg/dL (ref 6–20)
CO2: 25 mmol/L (ref 22–32)
Calcium: 8.9 mg/dL (ref 8.9–10.3)
Chloride: 101 mmol/L (ref 98–111)
Creatinine, Ser: 1.12 mg/dL (ref 0.61–1.24)
GFR, Estimated: >60 mL/min (ref >60)
Glucose, Bld: 370 mg/dL — ABNORMAL HIGH (ref 70–99)
Potassium: 3.6 mmol/L (ref 3.5–5.1)
Sodium: 135 mmol/L (ref 135–145)
Total Bilirubin: 0.6 mg/dL (ref 0.3–1.2)
Total Protein: 6.6 g/dL (ref 6.5–8.1)

## 2021-02-09 LAB — URINALYSIS, ROUTINE W REFLEX MICROSCOPIC
Bacteria, UA: NONE SEEN
Bilirubin Urine: NEGATIVE
Glucose, UA: >=500 mg/dL — AB
Hgb urine dipstick: NEGATIVE
Ketones, ur: NEGATIVE mg/dL
Leukocytes,Ua: NEGATIVE
Nitrite: NEGATIVE
Protein, ur: NEGATIVE mg/dL
Specific Gravity, Urine: 1.025 (ref 1.005–1.030)
pH: 5 (ref 5.0–8.0)

## 2021-02-09 LAB — CBC WITH DIFFERENTIAL/PLATELET
Abs Immature Granulocytes: 0.02 10*3/uL (ref 0.00–0.07)
Basophils Absolute: 0.1 10*3/uL (ref 0.0–0.1)
Basophils Relative: 1 %
Eosinophils Absolute: 0.2 10*3/uL (ref 0.0–0.5)
Eosinophils Relative: 3 %
HCT: 37.1 % — ABNORMAL LOW (ref 39.0–52.0)
Hemoglobin: 12.7 g/dL — ABNORMAL LOW (ref 13.0–17.0)
Immature Granulocytes: 0 %
Lymphocytes Relative: 30 %
Lymphs Abs: 2 10*3/uL (ref 0.7–4.0)
MCH: 29.9 pg (ref 26.0–34.0)
MCHC: 34.2 g/dL (ref 30.0–36.0)
MCV: 87.3 fL (ref 80.0–100.0)
Monocytes Absolute: 0.5 10*3/uL (ref 0.1–1.0)
Monocytes Relative: 8 %
Neutro Abs: 3.8 10*3/uL (ref 1.7–7.7)
Neutrophils Relative %: 58 %
Platelets: 305 10*3/uL (ref 150–400)
RBC: 4.25 MIL/uL (ref 4.22–5.81)
RDW: 12.4 % (ref 11.5–15.5)
WBC: 6.5 10*3/uL (ref 4.0–10.5)
nRBC: 0 % (ref 0.0–0.2)

## 2021-02-09 LAB — LIPASE, BLOOD: Lipase: 164 U/L — ABNORMAL HIGH (ref 11–51)

## 2021-02-09 NOTE — ED Triage Notes (Signed)
Pt c/o worsening chronic abdominal pain since Monday after waiting to have a BM for a prolonged period of time. States similar symptoms in the past following Hernia surgery 2020. Last BM yesterday.

## 2021-02-10 ENCOUNTER — Encounter (HOSPITAL_COMMUNITY): Payer: Self-pay | Admitting: Student

## 2021-02-10 ENCOUNTER — Emergency Department (HOSPITAL_COMMUNITY): Payer: BC Managed Care – PPO

## 2021-02-10 LAB — HEMOGLOBIN A1C
Hgb A1c MFr Bld: 7.3 % — ABNORMAL HIGH (ref 4.8–5.6)
Mean Plasma Glucose: 162.81 mg/dL

## 2021-02-10 MED ORDER — IOHEXOL 350 MG/ML SOLN
100.0000 mL | Freq: Once | INTRAVENOUS | Status: AC | PRN
  Administered 2021-02-10: 100 mL via INTRAVENOUS

## 2021-02-10 MED ORDER — NAPROXEN 500 MG PO TABS: 500.0000 mg | ORAL_TABLET | Freq: Two times a day (BID) | ORAL | 0 refills | Status: DC | PRN

## 2021-02-10 MED ORDER — METFORMIN HCL 500 MG PO TABS: 500.0000 mg | ORAL_TABLET | Freq: Two times a day (BID) | ORAL | 0 refills | Status: DC

## 2021-02-10 MED ORDER — SODIUM CHLORIDE 0.9 % IV BOLUS
1000.0000 mL | Freq: Once | INTRAVENOUS | Status: AC
  Administered 2021-02-10: 1000 mL via INTRAVENOUS

## 2021-02-10 MED ORDER — KETOROLAC TROMETHAMINE 15 MG/ML IJ SOLN
15.0000 mg | Freq: Once | INTRAMUSCULAR | Status: AC
  Administered 2021-02-10: 15 mg via INTRAVENOUS
  Filled 2021-02-10: qty 1

## 2021-02-10 NOTE — ED Notes (Signed)
Pt back from CT

## 2021-02-10 NOTE — ED Notes (Signed)
Patient transported to CT 

## 2021-02-10 NOTE — Discharge Instructions (Addendum)
You were seen in the emergency department today for abdominal pain.  Your labs show that your blood sugar was elevated which concerns [place] that you have diabetes.  We are starting you on metformin which is a medicine to lower your blood sugar.  Please take this twice per day.  Your lipase was mildly elevated, this is a blood test that evaluates the pancreas, reassuringly your pancreas was normal on your CT scan.   Your CT scan did show findings of epiploic appendagitis versus an omental infarct.  Please see attached handout for further information.  We are sending you home with naproxen to help with pain.  - Naproxen is a nonsteroidal anti-inflammatory medication that will help with pain and swelling. Be sure to take this medication as prescribed with food, 1 pill every 12 hours,  It should be taken with food, as it can cause stomach upset, and more seriously, stomach bleeding. Do not take other nonsteroidal anti-inflammatory medications with this such as Advil, Motrin, Aleve, Mobic, Goodie Powder, or Motrin.    You make take Tylenol per over the counter dosing with these medications.   We have prescribed you new medication(s) today. Discuss the medications prescribed today with your pharmacist as they can have adverse effects and interactions with your other medicines including over the counter and prescribed medications. Seek medical evaluation if you start to experience new or abnormal symptoms after taking one of these medicines, seek care immediately if you start to experience difficulty breathing, feeling of your throat closing, facial swelling, or rash as these could be indications of a more serious allergic reaction  Your CT scan also showed findings of a right inguinal hernia (right groin hernia).  We provided general surgery information for follow-up.  Please follow-up with your primary care provider within 3 to 5 days for recheck of your symptoms as well as your blood work. we have sent an A1c  test that further evaluate your blood sugars they can discuss with you at your follow-up appointment.  Return to the emergency department for any new or worsening symptoms including but not limited to new or worsening pain, inability to keep fluids down, fever, blood in your vomit or stool, passing out, chest pain, or any other concerns.

## 2021-02-10 NOTE — ED Provider Notes (Signed)
[facility]   CSN: [id number] Arrival date & time: 02/09/21  2106     History Chief Complaint  Patient presents with   Abdominal Pain    Phillip Clark is a 55 y.o. male with a history of hypertension, hyperlipidemia, & prior left inguinal hernia repair and vasectomy who presents to the ED with abdominal pain x 5 days. Patient reports that since his hernia repair in 2020 he does get some discomfort to the LLQ if he needs to have a BM and waits too long due to circumstances, this occurred on Monday 07/25, he felt the urge to have a BM and waited 10 hours. Pain has persisted since then, constant, worse with certain movements, no alleviating factors. Last BM was 1.5 days prior, smaller than normal, is passing gas. Denies fever, chills, nausea, vomiting, melena, hematochezia, dysuria, or testicular pain/swelling.   HPI     Past Medical History:  Diagnosis Date   Coronary artery calcification of native artery    Essential tremor    Hyperlipidemia    Hypertension    Nonobstructive atherosclerosis of coronary artery     Patient Active Problem List   Diagnosis Date Noted   Unstable angina (HCC) 09/29/2020   Pure hypercholesterolemia 09/29/2020   Premature ventricular contractions    Nonobstructive atherosclerosis of coronary artery    Atherosclerosis of aorta (HCC)    Former smoker    Family history of premature CAD    Precordial pain    Dyspnea on exertion     Past Surgical History:  Procedure Laterality Date   HERNIA REPAIR     LEFT HEART CATH AND CORONARY ANGIOGRAPHY N/A 10/02/2020   Procedure: LEFT HEART CATH AND CORONARY ANGIOGRAPHY;  Surgeon: Elder NegusPatwardhan, Manish J, MD;  Location: [facility] INVASIVE CV LAB;  Service: Cardiovascular;  Laterality: N/A;   VASECTOMY         Family History  Problem Relation Age of Onset   Hypertension Mother    Hyperlipidemia Mother    Diabetes Mother    Thyroid disease Mother    Heart  attack Father    Hypertension Father    Hyperlipidemia Father    Hypertension Sister    Hyperlipidemia Sister    Hypertension Brother    Hyperlipidemia Brother    Hyperlipidemia Sister    Hyperlipidemia Sister    Hyperlipidemia Sister     Social History   Tobacco Use   Smoking status: Former    Packs/day: 1.00    Years: 34.00    Pack years: 34.00    Types: Cigarettes    Quit date: 2018    Years since quitting: 4.5   Smokeless tobacco: Never  Vaping Use   Vaping Use: Never used  Substance Use Topics   Alcohol use: Not Currently   Drug use: Never    Home Medications Prior to Admission medications   Medication Sig Start Date End Date Taking? Authorizing Provider  aspirin EC 81 MG tablet Take 1 tablet (81 mg total) by mouth daily. Swallow whole. 08/22/20   Tolia, Sunit, DO  atorvastatin (LIPITOR) 20 MG tablet Take 1 tablet (20 mg total) by mouth at bedtime. 02/06/21 05/07/21  Tolia, Sunit, DO  metoprolol succinate (TOPROL-XL) 50 MG 24 hr tablet Take 1 tablet (50 mg total) by mouth in the morning. Take with or immediately following a meal. 02/06/21 05/07/21  Tolia, Sunit, DO  Multiple Vitamin (MULTIVITAMIN ADULT PO) Take 1 tablet by mouth daily.    [provider]  Omega-3 Fatty Acids (FISH OIL) 1000 MG CAPS Take 1 capsule by mouth daily.    [provider]    Allergies    Crestor [rosuvastatin calcium], Latex, Rosuvastatin, Topiramate, Varenicline, and Tape  Review of Systems   Review of Systems  Constitutional:  Negative for chills and fever.  Respiratory:  Negative for shortness of breath.   Cardiovascular:  Negative for chest pain.  Gastrointestinal:  Positive for abdominal pain. Negative for anal bleeding, blood in stool, diarrhea, nausea and vomiting.  Genitourinary:  Negative for dysuria, scrotal swelling and testicular pain.  Neurological:  Negative for syncope.  All other systems reviewed and are negative.  Physical Exam Updated Vital Signs BP  127/80   Pulse 72   Temp 98 F (36.7 C)   Resp 14   Ht 5\' 11"  (1.803 m)   Wt 104.3 kg   SpO2 99%   BMI 32.08 kg/m   Physical Exam Vitals and nursing note reviewed.  Constitutional:      General: He is not in acute distress.    Appearance: He is well-developed. He is not toxic-appearing.  HENT:     Head: Normocephalic and atraumatic.  Eyes:     General:        Right eye: No discharge.        Left eye: No discharge.     Conjunctiva/sclera: Conjunctivae normal.  Cardiovascular:     Rate and Rhythm: Normal rate and regular rhythm.  Pulmonary:     Effort: Pulmonary effort is normal. No respiratory distress.     Breath sounds: Normal breath sounds. No wheezing, rhonchi or rales.  Abdominal:     General: There is no distension.     Palpations: Abdomen is soft.     Tenderness: There is abdominal tenderness (most prominent to LLQ) in the right lower quadrant, suprapubic area and left lower quadrant. There is no guarding or rebound.     Comments: No overlying skin changes to the abdomen.   Musculoskeletal:     Cervical back: Neck supple.  Skin:    General: Skin is warm and dry.     Findings: No rash.  Neurological:     Mental Status: He is alert.     Comments: Clear speech.   Psychiatric:        Behavior: Behavior normal.    ED Results / Procedures / Treatments   Labs (all labs ordered are listed, but only abnormal results are displayed) Labs Reviewed  CBC WITH DIFFERENTIAL/PLATELET - Abnormal; Notable for the following components:      Result Value   Hemoglobin 12.7 (*)    HCT 37.1 (*)    All other components within normal limits  COMPREHENSIVE METABOLIC PANEL - Abnormal; Notable for the following components:   Glucose, Bld 370 (*)    All other components within normal limits  LIPASE, BLOOD - Abnormal; Notable for the following components:   Lipase 164 (*)    All other components within normal limits  URINALYSIS, ROUTINE W REFLEX MICROSCOPIC - Abnormal; Notable for  the following components:   Glucose, UA >=500 (*)    All other components within normal limits    EKG None  Radiology CT Abdomen Pelvis W Contrast  Result Date: 02/10/2021 CLINICAL DATA:  Worsening chronic abdominal pain. EXAM: CT ABDOMEN AND PELVIS WITH CONTRAST TECHNIQUE: Multidetector CT imaging of the abdomen and pelvis was performed using the standard protocol following bolus administration of intravenous contrast. CONTRAST:  02/12/2021 OMNIPAQUE  IOHEXOL 350 MG/ML SOLN COMPARISON:  CT cardiac study August 09, 2020. FINDINGS: Lower chest: Normal heart size. Dependent atelectasis bilateral lower lobes. No pleural effusion. Hepatobiliary: The liver is diffusely low in attenuation scotch that liver is normal in size and contour. No focal lesion is identified. Gallbladder is unremarkable. No intrahepatic or extrahepatic biliary ductal dilatation. Pancreas: Unremarkable Spleen: Unremarkable Adrenals/Urinary Tract: Normal adrenal glands. Kidneys enhance symmetrically with contrast. No hydronephrosis. Urinary bladder is not well visualized due to streak artifact. Stomach/Bowel: No abnormal bowel wall thickening or evidence for bowel obstruction. No free fluid or free intraperitoneal air. Normal morphology of the stomach. Vascular/Lymphatic: Normal caliber abdominal aorta. No retroperitoneal lymphadenopathy. Reproductive: Prostate is not well visualized due to streak artifact. Other: Small fat containing right inguinal hernia. Within the lower anterior abdominal fat there is stranding and inflammation (image 70; series 3). Musculoskeletal: Right hip joint arthroplasty. No aggressive or acute appearing osseous lesions. IMPRESSION: Inflammation within the lower anterior intra-abdominal fat which may represent omental infarct or epiploic appendagitis, a benign self-limiting condition. Electronically Signed   By: Annia Belt M.D.   On: 02/10/2021 10:41    Procedures Procedures   Medications Ordered in  ED Medications  sodium chloride 0.9 % bolus 1,000 mL (1,000 mLs Intravenous New Bag/Given 02/10/21 3903)    ED Course  I have reviewed the triage vital signs and the nursing notes.  Pertinent labs & imaging results that were available during my care of the patient were reviewed by me and considered in my medical decision making (see chart for details).    MDM Rules/Calculators/A&P                           Patient presents to the ED with complaints of abdominal pain. Nontoxic, vitals without significant abnormality.   Additional history obtained:  Additional history obtained from chart review & nursing note review.   Lab Tests:  I reviewed and interpreted labs, which included:  CBC: mild anemia.  CMP: hyperglycemia without anion gap elevation or acidosis.  Lipase: Mildly elevated UA: no UTI, glucosuria.   LLQ tenderness to palpation without peritoneal signs. Plan for fluids, offered analgesics- patient declined. CT A/P ordered for further assessment.   Imaging Studies ordered:  I ordered imaging studies which included CT A/P, I independently reviewed, formal radiology impression shows:  Inflammation within the lower anterior intra-abdominal fat which may represent omental infarct or epiploic appendagitis, a benign self-limiting condition  ED Course:  Suspect that findings of omental infarct vs. Epiploic appendagitis are the cause of patient's discomfort. Repeat abdominal exam remains without peritoneal signs- do not suspect acute surgical process at this time. Agreeable to NSAIDs- will give toradol in the ED and send home with a prescription for Naproxen. Made aware of findings of right inguinal hernia on CT- general surgery information will be provided. In terms of labs mild lipase elevation- pancreas is unremarkable on CT, no upper abdominal tenderness or N/V, additional found to be hyperglycemic with glucosuria, denies hx of DM- not in DKA/HHS- will send A1C and start on metformin  with PCP follow up.   I discussed results, treatment plan, need for follow-up, and return precautions with the patient. Provided opportunity for questions, patient confirmed understanding and is in agreement with plan.   Findings and plan of care discussed with supervising physician Dr. Dalene Seltzer who is in agreement.    Portions of this note were generated with Scientist, clinical (histocompatibility and immunogenetics). Dictation errors may  occur despite best attempts at proofreading.  Final Clinical Impression(s) / ED Diagnoses Final diagnoses:  Left lower quadrant abdominal pain  Hyperglycemia    Rx / DC Orders ED Discharge Orders          Ordered    metFORMIN (GLUCOPHAGE) 500 MG tablet  2 times daily with meals        02/10/21 1111    naproxen (NAPROSYN) 500 MG tablet  2 times daily PRN        02/10/21 1111             Jourden Gilson, [place] R, PA-C 02/10/21 1113    Alvira Monday, MD 02/11/21 2356

## 2021-02-22 ENCOUNTER — Telehealth: Payer: Self-pay

## 2021-02-22 DIAGNOSIS — I2584 Coronary atherosclerosis due to calcified coronary lesion: Secondary | ICD-10-CM

## 2021-02-22 DIAGNOSIS — E7800 Pure hypercholesterolemia, unspecified: Secondary | ICD-10-CM

## 2021-02-22 DIAGNOSIS — I2510 Atherosclerotic heart disease of native coronary artery without angina pectoris: Secondary | ICD-10-CM

## 2021-02-22 MED ORDER — ATORVASTATIN CALCIUM 20 MG PO TABS: 20.0000 mg | ORAL_TABLET | Freq: Every evening | ORAL | 3 refills | Status: DC

## 2021-02-26 ENCOUNTER — Other Ambulatory Visit: Payer: Self-pay | Admitting: Cardiology

## 2021-02-26 DIAGNOSIS — E7800 Pure hypercholesterolemia, unspecified: Secondary | ICD-10-CM

## 2021-02-26 DIAGNOSIS — I2584 Coronary atherosclerosis due to calcified coronary lesion: Secondary | ICD-10-CM

## 2021-02-26 DIAGNOSIS — I2510 Atherosclerotic heart disease of native coronary artery without angina pectoris: Secondary | ICD-10-CM

## 2021-03-04 ENCOUNTER — Other Ambulatory Visit: Payer: Self-pay

## 2021-03-04 ENCOUNTER — Encounter (HOSPITAL_BASED_OUTPATIENT_CLINIC_OR_DEPARTMENT_OTHER): Payer: Self-pay | Admitting: Obstetrics and Gynecology

## 2021-03-04 DIAGNOSIS — I10 Essential (primary) hypertension: Secondary | ICD-10-CM | POA: Diagnosis not present

## 2021-03-04 DIAGNOSIS — B353 Tinea pedis: Secondary | ICD-10-CM | POA: Diagnosis not present

## 2021-03-04 DIAGNOSIS — Z87891 Personal history of nicotine dependence: Secondary | ICD-10-CM | POA: Diagnosis not present

## 2021-03-04 DIAGNOSIS — Z7982 Long term (current) use of aspirin: Secondary | ICD-10-CM | POA: Diagnosis not present

## 2021-03-04 DIAGNOSIS — Z79899 Other long term (current) drug therapy: Secondary | ICD-10-CM | POA: Insufficient documentation

## 2021-03-04 DIAGNOSIS — L03032 Cellulitis of left toe: Secondary | ICD-10-CM | POA: Diagnosis not present

## 2021-03-04 DIAGNOSIS — Z91040 Latex allergy status: Secondary | ICD-10-CM | POA: Diagnosis not present

## 2021-03-04 DIAGNOSIS — M7989 Other specified soft tissue disorders: Secondary | ICD-10-CM | POA: Diagnosis present

## 2021-03-04 NOTE — ED Triage Notes (Signed)
Patient reports that his left foot is broken out and the pain is intensifying and now appears deeper. Noted areas of non-blanching tissue where the toes meet the bottom of the foot. Patient reports he believes it started as athlete foot and is not healing

## 2021-03-05 ENCOUNTER — Emergency Department (HOSPITAL_BASED_OUTPATIENT_CLINIC_OR_DEPARTMENT_OTHER)
Admission: EM | Admit: 2021-03-05 | Discharge: 2021-03-05 | Disposition: A | Discharge status: Home / Self Care | Payer: BC Managed Care – PPO | Attending: Emergency Medicine | Admitting: Emergency Medicine

## 2021-03-05 ENCOUNTER — Emergency Department (HOSPITAL_BASED_OUTPATIENT_CLINIC_OR_DEPARTMENT_OTHER): Payer: BC Managed Care – PPO

## 2021-03-05 DIAGNOSIS — L03032 Cellulitis of left toe: Secondary | ICD-10-CM

## 2021-03-05 DIAGNOSIS — B353 Tinea pedis: Secondary | ICD-10-CM

## 2021-03-05 LAB — CBG MONITORING, ED: Glucose-Capillary: 117 mg/dL — ABNORMAL HIGH (ref 70–99)

## 2021-03-05 MED ORDER — CEPHALEXIN 500 MG PO CAPS: 500.0000 mg | ORAL_CAPSULE | Freq: Four times a day (QID) | ORAL | 0 refills | Status: DC

## 2021-03-05 MED ORDER — CLOTRIMAZOLE 1 % EX CREA
TOPICAL_CREAM | CUTANEOUS | 0 refills | Status: DC
Start: 2021-03-05 — End: 2021-05-15

## 2021-03-05 NOTE — ED Notes (Signed)
Pt verbalizes understanding of discharge instructions. Opportunity for questioning and answers were provided. Armand removed by staff, pt discharged from ED to home. Educated to pick up Rx.  

## 2021-03-05 NOTE — ED Provider Notes (Signed)
[facility] EMERGENCY DEPT Provider Note   CSN: [id number] Arrival date & time: 03/04/21  2033     History Chief Complaint  Patient presents with   Foot Pain    Phillip Clark is a 55 y.o. male.  HPI     This is a 55 year old male with a history of hypertension, hyperlipidemia, coronary artery disease who presents with toe pain and swelling.  Patient presents with progressively worsening left foot pain and redness.  He states he thought he had athlete's foot.  He has been putting Vaseline on his toes.  He noted cracks between his toes and increasing redness of the fourth digit.  No fevers.  Denies history of diabetes but "I was recently on steroids in the put me on metformin."  Denies fevers or systemic symptoms.  Past Medical History:  Diagnosis Date   Coronary artery calcification of native artery    Essential tremor    Hyperlipidemia    Hypertension    Nonobstructive atherosclerosis of coronary artery     Patient Active Problem List   Diagnosis Date Noted   Unstable angina (HCC) 09/29/2020   Pure hypercholesterolemia 09/29/2020   Premature ventricular contractions    Nonobstructive atherosclerosis of coronary artery    Atherosclerosis of aorta (HCC)    Former smoker    Family history of premature CAD    Precordial pain    Dyspnea on exertion     Past Surgical History:  Procedure Laterality Date   HERNIA REPAIR     LEFT HEART CATH AND CORONARY ANGIOGRAPHY N/A 10/02/2020   Procedure: LEFT HEART CATH AND CORONARY ANGIOGRAPHY;  Surgeon: Elder Negus, MD;  Location: [facility] INVASIVE CV LAB;  Service: Cardiovascular;  Laterality: N/A;   VASECTOMY         Family History  Problem Relation Age of Onset   Hypertension Mother    Hyperlipidemia Mother    Diabetes Mother    Thyroid disease Mother    Heart attack Father    Hypertension Father    Hyperlipidemia Father    Hypertension Sister    Hyperlipidemia Sister    Hypertension Brother     Hyperlipidemia Brother    Hyperlipidemia Sister    Hyperlipidemia Sister    Hyperlipidemia Sister     Social History   Tobacco Use   Smoking status: Former    Packs/day: 1.00    Years: 34.00    Pack years: 34.00    Types: Cigarettes    Quit date: 2018    Years since quitting: 4.6   Smokeless tobacco: Never  Vaping Use   Vaping Use: Never used  Substance Use Topics   Alcohol use: Not Currently   Drug use: Never    Home Medications Prior to Admission medications   Medication Sig Start Date End Date Taking? Authorizing Provider  cephALEXin (KEFLEX) 500 MG capsule Take 1 capsule (500 mg total) by mouth 4 (four) times daily. 03/05/21  Yes Chen Holzman, Mayer Masker, MD  clotrimazole (LOTRIMIN) 1 % cream Apply to affected area 2 times daily 03/05/21  Yes Anirudh Baiz, Mayer Masker, MD  aspirin EC 81 MG tablet Take 1 tablet (81 mg total) by mouth daily. Swallow whole. 08/22/20   Tolia, Sunit, DO  atorvastatin (LIPITOR) 20 MG tablet Take 1 tablet (20 mg total) by mouth at bedtime. 02/22/21 05/23/21  Tolia, Sunit, DO  atorvastatin (LIPITOR) 40 MG tablet TAKE 1 TABLET BY MOUTH AT BEDTIME 02/28/21   Tolia, Sunit, DO  metFORMIN (GLUCOPHAGE) 500 MG tablet  Take 1 tablet (500 mg total) by mouth 2 (two) times daily with a meal. 02/10/21   Petrucelli, Samantha R, PA-C  metoprolol succinate (TOPROL-XL) 50 MG 24 hr tablet Take 1 tablet (50 mg total) by mouth in the morning. Take with or immediately following a meal. 02/06/21 05/07/21  Tolia, Sunit, DO  Multiple Vitamin (MULTIVITAMIN ADULT PO) Take 1 tablet by mouth daily.    [provider]  naproxen (NAPROSYN) 500 MG tablet Take 1 tablet (500 mg total) by mouth 2 (two) times daily as needed for moderate pain. 02/10/21   Petrucelli, Samantha R, PA-C  Omega-3 Fatty Acids (FISH OIL) 1000 MG CAPS Take 1 capsule by mouth daily.    [provider]    Allergies    Crestor [rosuvastatin calcium], Latex, Rosuvastatin, Topiramate, Varenicline, and Tape  Review  of Systems   Review of Systems  Constitutional:  Negative for fever.  Skin:  Positive for color change. Negative for wound.  All other systems reviewed and are negative.  Physical Exam Updated Vital Signs BP 124/69   Pulse 79   Temp 98.5 F (36.9 C)   Resp 16   SpO2 95%   Physical Exam Vitals and nursing note reviewed.  Constitutional:      Appearance: He is well-developed. He is not ill-appearing.  HENT:     Head: Normocephalic and atraumatic.     Mouth/Throat:     Mouth: Mucous membranes are moist.  Eyes:     Pupils: Pupils are equal, round, and reactive to light.  Cardiovascular:     Rate and Rhythm: Normal rate and regular rhythm.  Pulmonary:     Effort: Pulmonary effort is normal. No respiratory distress.  Abdominal:     Palpations: Abdomen is soft.  Musculoskeletal:     Cervical back: Neck supple.     Comments: Focused exam of the left foot, slight swelling noted of the fourth and fifth digits, there is extensive skin breakdown and tear in the fold between the toes and on the flexor surfaces, no rash, slight erythema mostly of the fourth digit, 2+ DP pulse  Lymphadenopathy:     Cervical: No cervical adenopathy.  Skin:    General: Skin is warm and dry.  Neurological:     Mental Status: He is alert and oriented to person, place, and time.  Psychiatric:        Mood and Affect: Mood normal.    ED Results / Procedures / Treatments   Labs (all labs ordered are listed, but only abnormal results are displayed) Labs Reviewed  CBG MONITORING, ED - Abnormal; Notable for the following components:      Result Value   Glucose-Capillary 117 (*)    All other components within normal limits    EKG None  Radiology DG Foot Complete Left  Result Date: 03/05/2021 CLINICAL DATA:  Left foot pain. EXAM: LEFT FOOT - COMPLETE 3+ VIEW COMPARISON:  None. FINDINGS: There is no acute fracture or dislocation. Mild osteopenia. No significant arthritic changes. The soft tissues are  unremarkable. IMPRESSION: No acute fracture or dislocation. Electronically Signed   By: Elgie Collard M.D.   On: 03/05/2021 00:47    Procedures Procedures   Medications Ordered in ED Medications - No data to display  ED Course  I have reviewed the triage vital signs and the nursing notes.  Pertinent labs & imaging results that were available during my care of the patient were reviewed by me and considered in my medical decision  making (see chart for details).    MDM Rules/Calculators/A&P                           Patient presents with pain and swelling of the left foot and toes.  He is nontoxic and vital signs are reassuring.  Denies systemic symptoms.  Physical exam is most consistent with tinea pedis with potential overlying cellulitis of the fourth digit.  Reports that he is currently on metformin.  Blood sugar 117.  Reassuring.  X-ray showed no evidence of bony involvement or osteomyelitis.  Will place on clotrimazole and cover with Keflex for possible superimposed cellulitis.  After history, exam, and medical workup I feel the patient has been appropriately medically screened and is safe for discharge home. Pertinent diagnoses were discussed with the patient. Patient was given return precautions. \ Final Clinical Impression(s) / ED Diagnoses Final diagnoses:  Tinea pedis of left foot  Cellulitis of toe of left foot    Rx / DC Orders ED Discharge Orders          Ordered    clotrimazole (LOTRIMIN) 1 % cream        03/05/21 0122    cephALEXin (KEFLEX) 500 MG capsule  4 times daily        03/05/21 0122             Shon Baton, MD 03/06/21 [phone]

## 2021-03-05 NOTE — Discharge Instructions (Addendum)
You were seen today for concerns for foot swelling and infection.  You have evidence of athlete's foot.  You may have superimposed cellulitis which is a bacterial infection.  Take medications as prescribed.  If you notice increased redness, swelling, oozing, you should be reevaluated.

## 2021-04-09 ENCOUNTER — Other Ambulatory Visit: Payer: Self-pay

## 2021-04-09 DIAGNOSIS — E7800 Pure hypercholesterolemia, unspecified: Secondary | ICD-10-CM

## 2021-04-09 DIAGNOSIS — I2510 Atherosclerotic heart disease of native coronary artery without angina pectoris: Secondary | ICD-10-CM

## 2021-04-09 DIAGNOSIS — I2584 Coronary atherosclerosis due to calcified coronary lesion: Secondary | ICD-10-CM

## 2021-04-10 ENCOUNTER — Ambulatory Visit: Payer: BC Managed Care – PPO | Admitting: Cardiology

## 2021-04-13 ENCOUNTER — Ambulatory Visit: Payer: BC Managed Care – PPO | Admitting: Cardiology

## 2021-05-04 LAB — LDL CHOLESTEROL, DIRECT: LDL Direct: 101 mg/dL — ABNORMAL HIGH (ref 0–99)

## 2021-05-04 LAB — LIPID PANEL WITH LDL/HDL RATIO
Cholesterol, Total: 158 mg/dL (ref 100–199)
HDL: 37 mg/dL — ABNORMAL LOW (ref >39)
LDL Chol Calc (NIH): 98 mg/dL (ref 0–99)
LDL/HDL Ratio: 2.6 ratio (ref 0.0–3.6)
Triglycerides: 125 mg/dL (ref 0–149)
VLDL Cholesterol Cal: 23 mg/dL (ref 5–40)

## 2021-05-04 LAB — CMP14+EGFR
ALT: 28 IU/L (ref 0–44)
AST: 30 IU/L (ref 0–40)
Albumin/Globulin Ratio: 1.8 (ref 1.2–2.2)
Albumin: 4.8 g/dL (ref 3.8–4.9)
Alkaline Phosphatase: 79 IU/L (ref 44–121)
BUN/Creatinine Ratio: 19 (ref 9–20)
BUN: 20 mg/dL (ref 6–24)
Bilirubin Total: 0.6 mg/dL (ref 0.0–1.2)
CO2: 23 mmol/L (ref 20–29)
Calcium: 9.4 mg/dL (ref 8.7–10.2)
Chloride: 100 mmol/L (ref 96–106)
Creatinine, Ser: 1.07 mg/dL (ref 0.76–1.27)
Globulin, Total: 2.6 g/dL (ref 1.5–4.5)
Glucose: 123 mg/dL — ABNORMAL HIGH (ref 70–99)
Potassium: 4.9 mmol/L (ref 3.5–5.2)
Sodium: 138 mmol/L (ref 134–144)
Total Protein: 7.4 g/dL (ref 6.0–8.5)
eGFR: 83 mL/min/{1.73_m2} (ref >59)

## 2021-05-10 NOTE — Progress Notes (Signed)
Called pt to inform him about his appt pt understood

## 2021-05-15 ENCOUNTER — Ambulatory Visit: Payer: BC Managed Care – PPO | Admitting: Cardiology

## 2021-05-15 ENCOUNTER — Encounter: Payer: Self-pay | Admitting: Cardiology

## 2021-05-15 ENCOUNTER — Other Ambulatory Visit: Payer: Self-pay

## 2021-05-15 VITALS — BP 127/82 | HR 77 | Temp 98.0°F | Resp 16 | Ht 71.0 in | Wt 230.0 lb

## 2021-05-15 DIAGNOSIS — I2584 Coronary atherosclerosis due to calcified coronary lesion: Secondary | ICD-10-CM

## 2021-05-15 DIAGNOSIS — E7800 Pure hypercholesterolemia, unspecified: Secondary | ICD-10-CM

## 2021-05-15 DIAGNOSIS — Z87891 Personal history of nicotine dependence: Secondary | ICD-10-CM

## 2021-05-15 DIAGNOSIS — Z8249 Family history of ischemic heart disease and other diseases of the circulatory system: Secondary | ICD-10-CM

## 2021-05-15 DIAGNOSIS — I2510 Atherosclerotic heart disease of native coronary artery without angina pectoris: Secondary | ICD-10-CM

## 2021-05-15 MED ORDER — METOPROLOL SUCCINATE ER 50 MG PO TB24: 50.0000 mg | ORAL_TABLET | Freq: Every morning | ORAL | 1 refills | Status: DC

## 2021-05-15 MED ORDER — ATORVASTATIN CALCIUM 40 MG PO TABS: 40.0000 mg | ORAL_TABLET | Freq: Every day | ORAL | 0 refills | Status: DC

## 2021-05-15 NOTE — Progress Notes (Signed)
Date:  05/15/2021   ID:  Phillip Clark, DOB Nov 14, 1965, MRN [id number]  PCP:  Percell Belt, DO  Cardiologist: Rex Kras, DO, FACC (established care 08/03/2020)  Date: 05/15/21 Last Office Visit: 10/12/2020   Chief Complaint  Patient presents with   Coronary Artery Disease   Follow-up    6 month   Chest Pain   Hyperlipidemia    HPI  Phillip Clark is a 55 y.o. male who presents to the office with a chief complaint of " 37-monthfollow-up for CAD management. " Patient's past medical history and cardiovascular risk factors include: Severe coronary artery calcification, moderate coronary artery disease per cardiac CTA, family history of premature coronary artery disease, pure hypercholesterolemia, former smoker, myalgias to statin therapy.  He is referred to the office at the request of Lazoff, Shawn P, DO for evaluation of chest pain.  Since establishing care he has undergone a coronary CTA which noted moderate coronary artery disease and the shared decision was to uptitrate GDMT and to treat his underlying hyperlipidemia which at that time his LDL level was 199 mg/dL.  However with up titration of medical therapy and improving risk factors he had a hospitalization for chest discomfort and underwent angiography and was noted to have nonobstructive CAD without the need of coronary interventions.  Since then patient has worked very hard with regards to improving his risk factors and was started statin therapy.  Since last office visit patient is doing well from a cardiovascular standpoint.  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  Patient states that the hip pain that he was associating to statin side effects was most likely due to his arthritis and hip issues.  Patient states that based on his recent lab work he would like to uptitrate statin therapy help improve his cardiovascular risk factors.  He denies any chest pain or shortness of breath at  rest or with effort related activities and no significant change in physical endurance.   FUNCTIONAL STATUS: No structured exercise program or daily routine.   ALLERGIES: Allergies  Allergen Reactions   Crestor [Rosuvastatin Calcium] Other (See Comments)    MYALGIAS   Latex    Rosuvastatin     Other reaction(s): Myalgias (intolerance)   Topiramate     Other reaction(s): Other (See Comments) Tingling legs Tingling legs    Varenicline     Other reaction(s): Other (See Comments) Suicidal idea Suicidal idea    Tape Rash    "takes skins off"    MEDICATION LIST PRIOR TO VISIT: Current Meds  Medication Sig   aspirin EC 81 MG tablet Take 1 tablet (81 mg total) by mouth daily. Swallow whole.   metFORMIN (GLUCOPHAGE) 500 MG tablet Take 1 tablet (500 mg total) by mouth 2 (two) times daily with a meal.   Multiple Vitamin (MULTIVITAMIN ADULT PO) Take 1 tablet by mouth daily.   Omega-3 Fatty Acids (FISH OIL) 1000 MG CAPS Take 1 capsule by mouth daily.   [DISCONTINUED] atorvastatin (LIPITOR) 20 MG tablet Take 1 tablet (20 mg total) by mouth at bedtime.   [DISCONTINUED] atorvastatin (LIPITOR) 40 MG tablet TAKE 1 TABLET BY MOUTH AT BEDTIME     PAST MEDICAL HISTORY: Past Medical History:  Diagnosis Date   Coronary artery calcification of native artery    Essential tremor    Hyperlipidemia    Hypertension    Nonobstructive atherosclerosis of coronary artery     PAST SURGICAL HISTORY: Past Surgical History:  Procedure Laterality Date   HERNIA REPAIR     LEFT HEART CATH AND CORONARY ANGIOGRAPHY N/A 10/02/2020   Procedure: LEFT HEART CATH AND CORONARY ANGIOGRAPHY;  Surgeon: Nigel Mormon, MD;  Location: [facility];  Service: Cardiovascular;  Laterality: N/A;   VASECTOMY      FAMILY HISTORY: The patient family history includes Diabetes in his mother; Heart attack in his father; Hyperlipidemia in his brother, father, mother, sister, sister, sister, and sister;  Hypertension in his brother, father, mother, and sister; Thyroid disease in his mother.  SOCIAL HISTORY:  The patient  reports that he quit smoking about 4 years ago. His smoking use included cigarettes. He has a 34.00 pack-year smoking history. He has never used smokeless tobacco. He reports that he does not currently use alcohol. He reports that he does not use drugs.  REVIEW OF SYSTEMS: Review of Systems  Constitutional: Negative for chills and fever.  HENT:  Negative for hoarse voice and nosebleeds.   Eyes:  Negative for discharge, double vision and pain.  Cardiovascular:  Negative for chest pain, claudication, dyspnea on exertion, leg swelling, near-syncope, orthopnea, palpitations, paroxysmal nocturnal dyspnea and syncope.  Respiratory:  Negative for hemoptysis and shortness of breath.   Musculoskeletal:  Negative for muscle cramps and myalgias.  Gastrointestinal:  Negative for abdominal pain, constipation, diarrhea, hematemesis, hematochezia, melena, nausea and vomiting.  Neurological:  Negative for dizziness and light-headedness.   PHYSICAL EXAM: Vitals with BMI 05/15/2021 03/05/2021 03/05/2021  Height $Remov'5\' 11"'zOwxqv$  - -  Weight 230 lbs - -  BMI 99.24 - -  Systolic 268 341 962  Diastolic 82 77 69  Pulse 77 68 79    CONSTITUTIONAL: Well-developed and well-nourished. No acute distress.  SKIN: Skin is warm and dry. No rash noted. No cyanosis. No pallor. No jaundice HEAD: Normocephalic and atraumatic.  EYES: No scleral icterus MOUTH/THROAT: Moist oral membranes.  NECK: No JVD present. No thyromegaly noted. No carotid bruits  LYMPHATIC: No visible cervical adenopathy.  CHEST Normal respiratory effort. No intercostal retractions  LUNGS: Clear to auscultation bilaterally.  No stridor. No wheezes. No rales.  CARDIOVASCULAR: Regular rate and rhythm, positive I2-L7, soft holosystolic murmur heard at the apex, no gallops or rubs. ABDOMINAL: No apparent ascites.  EXTREMITIES: No peripheral  edema  HEMATOLOGIC: No significant bruising NEUROLOGIC: Oriented to person, place, and time. Nonfocal. Normal muscle tone.  PSYCHIATRIC: Normal mood and affect. Normal behavior. Cooperative  CARDIAC DATABASE: EKG: 05/15/2021: Normal sinus rhythm, 69 bpm, normal axis, LAE, without underlying ischemia or injury pattern.  Echocardiogram: 09/30/2020:  LVEF 60-65%, no regional wall motion abnormalities, normal diastolic function, mild dilatation aortic root at 40 mm.   Stress Testing: No results found for this or any previous visit from the past 1095 days.  Heart Catheterization: 10/02/2020:  LM: Normal  LAD: Diffuse luminal irregularities with prox 20%, mid 30% stenoses  LCx: Prox 30%, OM1 30% stenoses   RCA: Diffuse luminal irregularities with prox, mid, and  RPDA 20% stenoses  LVEDP 10 mmHg Mild CAD, normal LVEDP Symptoms of dyspnea and chest pain at rest out of proportion to angiography findings Consider alternate etiology  CCTA:  08/09/2020: 1. Coronary calcium score of 534. This was 98th percentile for age and sex matched control. 2. Normal coronary origin with right dominance. 3. CAD-RADS = 3. Moderate stenosis within mid to distal LAD, Moderate stenosis within proximal and distal RCA, Moderate stenosis within OM1 branch. 4.  Aortic atherosclerosis. 1.  CT FFR analysis showed no  significant stenosis. RECOMMENDATIONS: Goal directed medical therapy and aggressive risk factor modification for secondary prevention of coronary artery disease.  LABORATORY DATA: CBC Latest Ref Rng & Units 02/09/2021 10/02/2020 10/01/2020  WBC 4.0 - 10.5 K/uL 6.5 5.4 5.3  Hemoglobin 13.0 - 17.0 g/dL 12.7(L) 13.7 13.6  Hematocrit 39.0 - 52.0 % 37.1(L) 38.7(L) 38.6(L)  Platelets 150 - 400 K/uL 305 272 282    CMP Latest Ref Rng & Units 05/03/2021 02/09/2021 10/09/2020  Glucose 70 - 99 mg/dL 123(H) 370(H) 125(H)  BUN 6 - 24 mg/dL _0 Creatinine 0.76 - 1.27 mg/dL 1.07 1.12 1.05  Sodium 134 - 144  mmol/L 138 135 140  Potassium 3.5 - 5.2 mmol/L 4.9 3.6 4.8  Chloride 96 - 106 mmol/L 100 101 103  CO2 20 - 29 mmol/L 23 25 -  Calcium 8.7 - 10.2 mg/dL 9.4 8.9 9.2  Total Protein 6.0 - 8.5 g/dL 7.4 6.6 6.8  Total Bilirubin 0.0 - 1.2 mg/dL 0.6 0.6 0.3  Alkaline Phos 44 - 121 IU/L 79 63 76  AST 0 - 40 IU/L _1 ALT 0 - 44 IU/L 28 27 -    Lipid Panel     Component Value Date/Time   CHOL 158 05/03/2021 0841   TRIG 125 05/03/2021 0841   HDL 37 (L) 05/03/2021 0841   CHOLHDL 5.0 10/02/2020 0235   VLDL 17 10/02/2020 0235   LDLCALC 98 05/03/2021 0841   LDLDIRECT 101 (H) 05/03/2021 0841   LABVLDL 23 05/03/2021 0841   Hepatic Function Panel Recent Labs    08/07/20 0810 10/09/20 0808 02/09/21 2116 05/03/21 0842  PROT 7.4 6.8 6.6 7.4  ALBUMIN 4.8 4.2 3.9 4.8  AST _2 ALT 26  --  27 28  ALKPHOS 69 76 63 79  BILITOT 0.4 0.3 0.6 0.6    Lab Results  Component Value Date   CHOL 158 05/03/2021   HDL 37 (L) 05/03/2021   LDLCALC 98 05/03/2021   LDLDIRECT 101 (H) 05/03/2021   TRIG 125 05/03/2021   CHOLHDL 5.0 10/02/2020    External Labs: Collected: 06/29/2020, Care Everywhere [facility] Hemoglobin 14.1 g/dL, hematocrit 40.9% Creatinine 0.94 mg/dL. eGFR: >90 mL/min per 1.73 m Lipid profile: Total cholesterol 239, triglycerides 130, HDL 45, LDL 199, non-HDL 194 AST 18, ALT 23, alkaline phosphatase 60   IMPRESSION:    ICD-10-CM   1. Coronary atherosclerosis due to calcified coronary lesion  I25.10 EKG 12-Lead   I25.84 atorvastatin (LIPITOR) 40 MG tablet    metoprolol succinate (TOPROL-XL) 50 MG 24 hr tablet    2. Pure hypercholesterolemia  E78.00 atorvastatin (LIPITOR) 40 MG tablet    CMP14+EGFR    Lipid Panel With LDL/HDL Ratio    LDL cholesterol, direct    3. Family history of premature CAD  Z82.49     4. Former smoker  Z87.891        RECOMMENDATIONS: Caison Hearn is a 55 y.o. male whose past medical history and cardiac  risk factors include: Severe coronary artery calcification, moderate coronary artery disease per cardiac CTA, family history of premature coronary artery disease, pure hypercholesterolemia, former smoker, myalgias to statin therapy.  Coronary atherosclerosis due to calcified coronary lesion Chest pain-free No use of sublingual nitroglycerin tablets. EKG nonischemic Most recent ischemic work-up reviewed as a part of this office visit. Most recent labs from 10/20-2022 independently reviewed Metoprolol refilled.  Pure hypercholesterolemia Initial LDL prior to statin therapy initiation was 199  mg/dL. Most recent LDL 101 mg/dL. We discussed increasing compliance with Lipitor 20 mg p.o. nightly and to reevaluate in 3 months and if his lipids are not at goal we will uptitrate therapy.  However, patient states that he would like to initiate a higher dose of Lipitor going forward. New prescription for Lipitor 40 mg p.o. nightly sent to the pharmacy. Will recheck labs in 6 weeks to evaluate fasting lipid profile and liver function.  Former smoker Educated on the importance of continued smoking cessation.  I like to see him back in 1 year for reevaluation given his nonobstructive CAD and multiple cardiovascular risk factors.  Patient has his annual physical examination in July every year.  I have asked him to either send a copy of his labs to the office for review or to follow-up in July/August 2023 as his annual visit.   FINAL MEDICATION LIST END OF ENCOUNTER: Meds ordered this encounter  Medications   atorvastatin (LIPITOR) 40 MG tablet    Sig: Take 1 tablet (40 mg total) by mouth at bedtime.    Dispense:  90 tablet    Refill:  0   metoprolol succinate (TOPROL-XL) 50 MG 24 hr tablet    Sig: Take 1 tablet (50 mg total) by mouth in the morning. Take with or immediately following a meal.    Dispense:  90 tablet    Refill:  1     Medications Discontinued During This Encounter  Medication  Reason   cephALEXin (KEFLEX) 500 MG capsule Error   clotrimazole (LOTRIMIN) 1 % cream Error   naproxen (NAPROSYN) 500 MG tablet Error   atorvastatin (LIPITOR) 20 MG tablet    metoprolol succinate (TOPROL-XL) 50 MG 24 hr tablet Reorder   atorvastatin (LIPITOR) 40 MG tablet Reorder     Current Outpatient Medications:    aspirin EC 81 MG tablet, Take 1 tablet (81 mg total) by mouth daily. Swallow whole., Disp: 90 tablet, Rfl: 3   metFORMIN (GLUCOPHAGE) 500 MG tablet, Take 1 tablet (500 mg total) by mouth 2 (two) times daily with a meal., Disp: 60 tablet, Rfl: 0   Multiple Vitamin (MULTIVITAMIN ADULT PO), Take 1 tablet by mouth daily., Disp: , Rfl:    Omega-3 Fatty Acids (FISH OIL) 1000 MG CAPS, Take 1 capsule by mouth daily., Disp: , Rfl:    atorvastatin (LIPITOR) 40 MG tablet, Take 1 tablet (40 mg total) by mouth at bedtime., Disp: 90 tablet, Rfl: 0   metoprolol succinate (TOPROL-XL) 50 MG 24 hr tablet, Take 1 tablet (50 mg total) by mouth in the morning. Take with or immediately following a meal., Disp: 90 tablet, Rfl: 1  Orders Placed This Encounter  Procedures   CMP14+EGFR   Lipid Panel With LDL/HDL Ratio   LDL cholesterol, direct   EKG 12-Lead   There are no Patient Instructions on file for this visit.  --Continue cardiac medications as reconciled in final medication list. --Return in about 1 year (around 05/15/2022) for Follow up, CAD, Lipid. Or sooner if needed. --Continue follow-up with your primary care physician regarding the management of your other chronic comorbid conditions.  Patient's questions and concerns were addressed to his satisfaction. He voices understanding of the instructions provided during this encounter.   This note was created using a voice recognition software as a result there may be grammatical errors inadvertently enclosed that do not reflect the nature of this encounter. Every attempt is made to correct such errors.  Jini Horiuchi Terri Skains, DO, [facility]  Pager:  [phone] Office: [phone]

## 2021-05-30 ENCOUNTER — Other Ambulatory Visit: Payer: Self-pay

## 2021-05-30 DIAGNOSIS — E7800 Pure hypercholesterolemia, unspecified: Secondary | ICD-10-CM

## 2021-06-07 ENCOUNTER — Emergency Department (HOSPITAL_BASED_OUTPATIENT_CLINIC_OR_DEPARTMENT_OTHER): Payer: BC Managed Care – PPO

## 2021-06-07 ENCOUNTER — Emergency Department (HOSPITAL_BASED_OUTPATIENT_CLINIC_OR_DEPARTMENT_OTHER)
Admission: EM | Admit: 2021-06-07 | Discharge: 2021-06-07 | Disposition: A | Discharge status: Home / Self Care | Payer: BC Managed Care – PPO | Attending: Emergency Medicine | Admitting: Emergency Medicine

## 2021-06-07 ENCOUNTER — Encounter (HOSPITAL_BASED_OUTPATIENT_CLINIC_OR_DEPARTMENT_OTHER): Payer: Self-pay

## 2021-06-07 ENCOUNTER — Other Ambulatory Visit: Payer: Self-pay

## 2021-06-07 DIAGNOSIS — Z91040 Latex allergy status: Secondary | ICD-10-CM | POA: Diagnosis not present

## 2021-06-07 DIAGNOSIS — Z79899 Other long term (current) drug therapy: Secondary | ICD-10-CM | POA: Insufficient documentation

## 2021-06-07 DIAGNOSIS — Z7982 Long term (current) use of aspirin: Secondary | ICD-10-CM | POA: Insufficient documentation

## 2021-06-07 DIAGNOSIS — I10 Essential (primary) hypertension: Secondary | ICD-10-CM | POA: Diagnosis not present

## 2021-06-07 DIAGNOSIS — M25572 Pain in left ankle and joints of left foot: Secondary | ICD-10-CM | POA: Insufficient documentation

## 2021-06-07 DIAGNOSIS — S9002XA Contusion of left ankle, initial encounter: Secondary | ICD-10-CM | POA: Insufficient documentation

## 2021-06-07 DIAGNOSIS — Z87891 Personal history of nicotine dependence: Secondary | ICD-10-CM | POA: Insufficient documentation

## 2021-06-07 DIAGNOSIS — W208XXA Other cause of strike by thrown, projected or falling object, initial encounter: Secondary | ICD-10-CM | POA: Insufficient documentation

## 2021-06-07 DIAGNOSIS — S99912A Unspecified injury of left ankle, initial encounter: Secondary | ICD-10-CM | POA: Diagnosis present

## 2021-06-07 NOTE — ED Provider Notes (Signed)
[facility] EMERGENCY DEPT Provider Note   CSN: [id number] Arrival date & time: 06/07/21  1827     History Chief Complaint  Patient presents with   Ankle Pain    Phillip Clark is a 55 y.o. male.  Patient presents the emergency department for evaluation of left ankle injury.  He states that he was working with a heavy piece of porcelain that fell on the lateral part of the ankle around 7 AM today.  He has been having difficulty bearing weight because of pain but is getting around.  No numbness or tingling that is persistent.  He has a little bit of bruising and swelling that is started on the outside of the ankle.  No knee or hip pain.  He was concerned that he may have broken the ankle.      Past Medical History:  Diagnosis Date   Coronary artery calcification of native artery    Essential tremor    Hyperlipidemia    Hypertension    Nonobstructive atherosclerosis of coronary artery     Patient Active Problem List   Diagnosis Date Noted   Unstable angina (HCC) 09/29/2020   Pure hypercholesterolemia 09/29/2020   Premature ventricular contractions    Nonobstructive atherosclerosis of coronary artery    Atherosclerosis of aorta (HCC)    Former smoker    Family history of premature CAD    Precordial pain    Dyspnea on exertion     Past Surgical History:  Procedure Laterality Date   HERNIA REPAIR     LEFT HEART CATH AND CORONARY ANGIOGRAPHY N/A 10/02/2020   Procedure: LEFT HEART CATH AND CORONARY ANGIOGRAPHY;  Surgeon: Elder Negus, MD;  Location: [facility];  Service: Cardiovascular;  Laterality: N/A;   VASECTOMY         Family History  Problem Relation Age of Onset   Hypertension Mother    Hyperlipidemia Mother    Diabetes Mother    Thyroid disease Mother    Heart attack Father    Hypertension Father    Hyperlipidemia Father    Hypertension Sister    Hyperlipidemia Sister    Hypertension Brother    Hyperlipidemia Brother     Hyperlipidemia Sister    Hyperlipidemia Sister    Hyperlipidemia Sister     Social History   Tobacco Use   Smoking status: Former    Packs/day: 1.00    Years: 34.00    Pack years: 34.00    Types: Cigarettes    Quit date: 2018    Years since quitting: 4.8   Smokeless tobacco: Never  Vaping Use   Vaping Use: Never used  Substance Use Topics   Alcohol use: Not Currently   Drug use: Never    Home Medications Prior to Admission medications   Medication Sig Start Date End Date Taking? Authorizing Provider  aspirin EC 81 MG tablet Take 1 tablet (81 mg total) by mouth daily. Swallow whole. 08/22/20   Tolia, Sunit, DO  atorvastatin (LIPITOR) 40 MG tablet Take 1 tablet (40 mg total) by mouth at bedtime. 05/15/21 08/13/21  Tolia, Sunit, DO  metFORMIN (GLUCOPHAGE) 500 MG tablet Take 1 tablet (500 mg total) by mouth 2 (two) times daily with a meal. 02/10/21   Petrucelli, Samantha R, PA-C  metoprolol succinate (TOPROL-XL) 50 MG 24 hr tablet Take 1 tablet (50 mg total) by mouth in the morning. Take with or immediately following a meal. 05/15/21 11/11/21  Tolia, Sunit, DO  Multiple Vitamin (MULTIVITAMIN  ADULT PO) Take 1 tablet by mouth daily.    [provider]  Omega-3 Fatty Acids (FISH OIL) 1000 MG CAPS Take 1 capsule by mouth daily.    [provider]    Allergies    Crestor [rosuvastatin calcium], Latex, Rosuvastatin, Topiramate, Varenicline, and Tape  Review of Systems   Review of Systems  Constitutional:  Negative for activity change.  Musculoskeletal:  Positive for arthralgias, gait problem and joint swelling.  Skin:  Negative for wound.  Neurological:  Negative for weakness and numbness.   Physical Exam Updated Vital Signs BP 121/75 (BP Location: Right Arm)   Pulse 85   Temp 98.4 F (36.9 C) (Oral)   Resp 18   Ht 6' (1.829 m)   Wt 104.3 kg   SpO2 100%   BMI 31.19 kg/m   Physical Exam Vitals and nursing note reviewed.  Constitutional:      Appearance: He  is well-developed.  HENT:     Head: Normocephalic and atraumatic.  Eyes:     Conjunctiva/sclera: Conjunctivae normal.  Cardiovascular:     Pulses: Normal pulses. No decreased pulses.  Musculoskeletal:        General: Tenderness present.     Cervical back: Normal range of motion and neck supple.     Right lower leg: No edema.     Left lower leg: Tenderness and bony tenderness present. No edema.     Left ankle: Tenderness present. No proximal fibula tenderness. Decreased range of motion.     Left foot: Normal range of motion. No tenderness.     Comments: Patient point tender over the lateral left ankle over the distal fibula.  Mild ecchymoses.  Mild swelling.  Skin:    General: Skin is warm and dry.  Neurological:     Mental Status: He is alert.     Sensory: No sensory deficit.     Comments: Motor, sensation, and vascular distal to the injury is fully intact.   Psychiatric:        Mood and Affect: Mood normal.    ED Results / Procedures / Treatments   Labs (all labs ordered are listed, but only abnormal results are displayed) Labs Reviewed - No data to display  EKG None  Radiology DG Ankle Complete Left  Result Date: 06/07/2021 CLINICAL DATA:  Injury. EXAM: LEFT ANKLE COMPLETE - 3+ VIEW COMPARISON:  None. FINDINGS: There is no evidence of fracture, dislocation, or joint effusion. There is no evidence of arthropathy or other focal bone abnormality. Soft tissues are unremarkable. IMPRESSION: Negative. Electronically Signed   By: Gerome Sam III M.D.   On: 06/07/2021 19:44    Procedures Procedures   Medications Ordered in ED Medications - No data to display  ED Course  I have reviewed the triage vital signs and the nursing notes.  Pertinent labs & imaging results that were available during my care of the patient were reviewed by me and considered in my medical decision making (see chart for details).  Patient seen and examined.  X-ray personally reviewed at bedside.   Will provide with ASO.  Discussed rice protocol.  Encourage PCP follow-up as needed.  Patient states that he plans to work tomorrow.  Vital signs reviewed and are as follows: BP 121/75 (BP Location: Right Arm)   Pulse 85   Temp 98.4 F (36.9 C) (Oral)   Resp 18   Ht 6' (1.829 m)   Wt 104.3 kg   SpO2 100%   BMI 31.19  kg/m      MDM Rules/Calculators/A&P                           Left ankle injury today, negative x-ray.  Lower extremity is neurovascularly intact.  RICE protocol and pain control indicated.   Final Clinical Impression(s) / ED Diagnoses Final diagnoses:  Contusion of left ankle, initial encounter    Rx / DC Orders ED Discharge Orders     None        Desmond Dike 06/07/21 2007    Pollyann Savoy, MD 06/07/21 2321

## 2021-06-07 NOTE — Discharge Instructions (Signed)
Please read and follow all provided instructions.  Your diagnoses today include:  1. Contusion of left ankle, initial encounter     Tests performed today include: An x-ray of the affected area - does NOT show any broken bones Vital signs. See below for your results today.   Medications prescribed:  Please use over-the-counter NSAID medications (ibuprofen, naproxen) as directed on the packaging for pain if you do not have any reasons not to take these medications just as weak kidneys or a history of bleeding in your stomach or gut.   Take any prescribed medications only as directed.  Home care instructions:  Follow any educational materials contained in this packet Follow R.I.C.E. Protocol: R - rest your injury  I  - use ice on injury without applying directly to skin C - compress injury with bandage or splint E - elevate the injury as much as possible  Follow-up instructions: Please follow-up with your primary care provider if you continue to have significant pain in 1 week. In this case you may have a more severe injury that requires further care.   Return instructions:  Please return if your toes or feet are numb or tingling, appear gray or blue, or you have severe pain (also elevate the leg and loosen splint or wrap if you were given one) Please return to the Emergency Department if you experience worsening symptoms.  Please return if you have any other emergent concerns.  Additional Information:  Your vital signs today were: BP 121/75 (BP Location: Right Arm)   Pulse 85   Temp 98.4 F (36.9 C) (Oral)   Resp 18   Ht 6' (1.829 m)   Wt 104.3 kg   SpO2 100%   BMI 31.19 kg/m  If your blood pressure (BP) was elevated above 135/85 this visit, please have this repeated by your doctor within one month. --------------

## 2021-06-07 NOTE — ED Triage Notes (Signed)
Heavy piece of porcelain fell and struck left ankle

## 2021-12-19 IMAGING — CT CT HEART MORP W/ CTA COR W/ SCORE W/ CA W/CM &/OR W/O CM
1 of 2 series · 4 of 20 positions shown, 5 images · IV contrast (omnipaque)
Comparison: None.
COMPARISON: None.

Addendum:
EXAM:
OVER-READ INTERPRETATION  CT CHEST

The following report is an over-read performed by radiologist Dr.
Vini Seide [[email or web address]] on 08/09/2020. This over-read
does not include interpretation of cardiac or coronary anatomy or
pathology. The coronary CTA interpretation by the cardiologist is
attached.
HISTORY: Chest pain/anginal equiv, 10yr CHD risk < 10%, not treadmill
candidate Dyspnea on exertion (NABOZNY)
Cardiac/Coronary  CT
TECHNIQUE: The patient was scanned on a Siemens Force scanner.
PROTOCOL: A 120 kV prospective scan was triggered in the descending thoracic
aorta at 111 HU's. Axial non-contrast 3 mm slices were carried out
through the heart. The data set was analyzed on a dedicated work
station and scored using the Agatson method. Gantry rotation speed
was 250 msecs and collimation was .6 mm. IV beta blockade and 0.8 mg
of sl NTG was given. The 3D data set was reconstructed in 5%
intervals of the 67-82 % of the R-R cycle. Diastolic phases were
analyzed on a dedicated work station using MPR, MIP and VRT modes.
The patient received 80mL OMNIPAQUE IOHEXOL 350 MG/ML SOLN of
contrast.

[Series 363: — · 4 of 36 slices shown, 5 images]
[im 3/36  vessel]
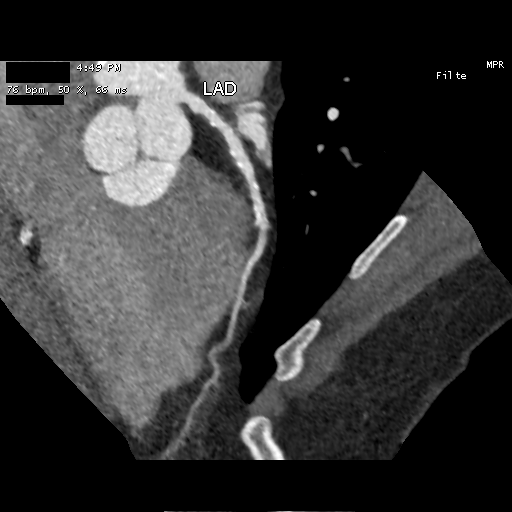
[im 3/36  lung]
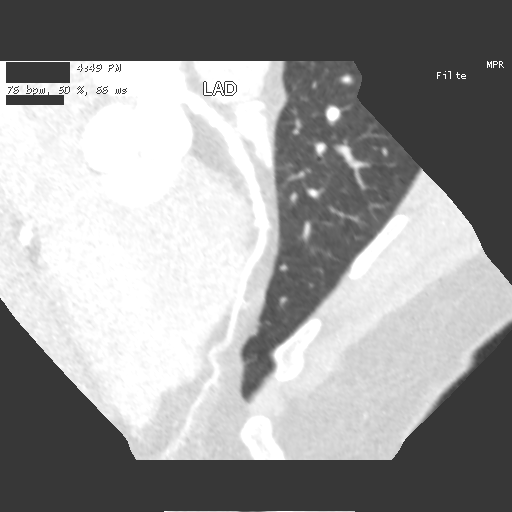
[im 5/36  vessel]
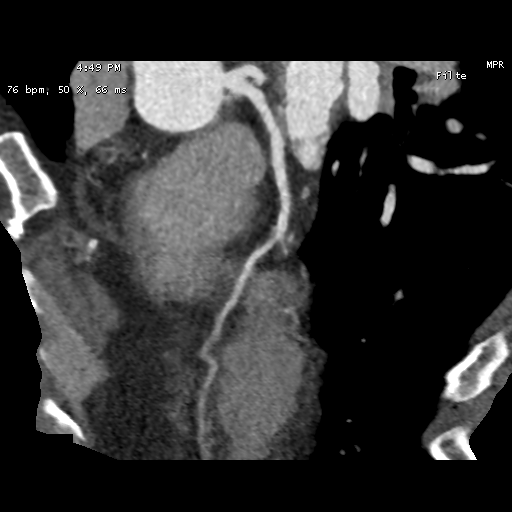
[im 7/36  vessel]
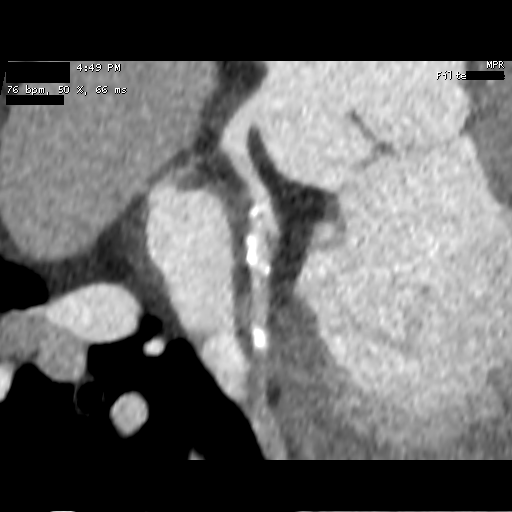
[im 33/36  vessel]
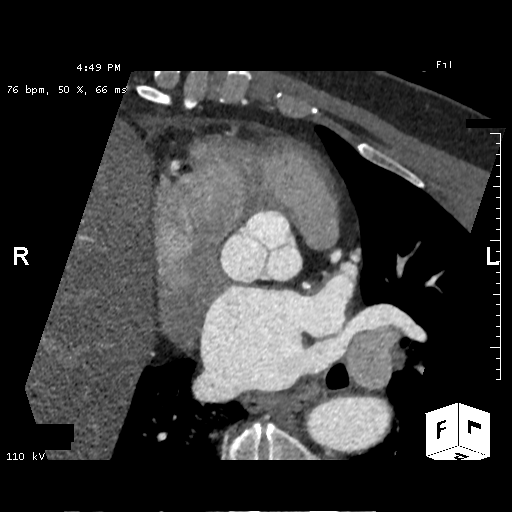

[4 of 20 positions shown; findings below may reference images not displayed]

FINDINGS: Vascular: Heart is normal size. Aorta normal caliber. Scattered
calcifications in the aortic root.

Mediastinum/Nodes: No adenopathy

Lungs/Pleura: No confluent opacities or effusions.

Upper Abdomen: Diffuse hepatic steatosis.

Musculoskeletal: Chest wall soft tissues are unremarkable. No acute
bony abnormality.
IMPRESSION: Scattered aortic calcifications in the aortic root.

No acute extra cardiac abnormality.

Suspect hepatic steatosis.
FINDINGS: Image quality: Average.

Noise artifact is: Limited.

Coronary artery calcification score:

Left main: 0

Left anterior descending artery:27

Left circumflex artery: 134

Right coronary artery: 373

Total calcium score: 534 [id number]

Coronary calcium score is 534, which places the patient in the 98th
percentile for age and sex matched control.

Coronary arteries: Normal coronary origins.  Right dominance.

Left Main Coronary Artery: The left main is a large caliber vessel
with a normal take off from the left coronary cusp that bifurcates
to form a left anterior descending artery and a left circumflex
artery. There is no plaque or stenosis.

Left Anterior Descending Coronary Artery: Normal caliber vessel that
gives rise to 2 diagonal branches and reaches apex. Minimal stenosis
(1-24%) in proximal LAD due to mixed plaque. Moderate stenosis
(50-69%) in mid to distal LAD due to noncalcified plaque. Both
diagonal branches are patent.

Left Circumflex Artery: Non-dominant vessel that travels within AV
groove and gives rise to one obtuse marginal branch. LCx is patent
without evidence of plaque or stenosis. Moderate stenosis (50-69%)
due to mixed plaque within ostial OM1.

Right Coronary Artery: The RCA is dominant with normal take off from
the right coronary cusp. The RCA terminates as a PDA and right
posterolateral branch. Moderate stenosis (50-69%) due to
noncalcified plaque and mild stenosis (25-49%) due to calcified
plaque within proximal RCA. Moderate stenosis (50-69%) distal RCA
due to mixed plaque.

Left Atrium: Left atrium is grossly normal in size with no left
atrial appendage filling defect.

Left Ventricle: The ventricular cavity size is within normal limits.
There are no stigmata of prior infarction. There is no abnormal
filling defect.

Pulmonary arteries: Normal in size without proximal filling defect.

Pulmonary veins: Normal pulmonary venous drainage.

Aorta: Normal size, 32 mm at the mid ascending aorta (level of the
PA bifurcation) measured double oblique. Aortic atherosclerosis. No
dissection.

Pericardium: Normal thickness with no significant effusion or
calcium present.

Cardiac valves: The aortic valve is trileaflet without
calcification. The mitral valve is normal structure without
calcification.

Extra-cardiac findings: See attached radiology report for
non-cardiac structures.
IMPRESSION: 1. Coronary calcium score of 534. This was 98th percentile for age
and sex matched control.

2. Normal coronary origin with right dominance.

3. CAD-RADS = 3. Moderate stenosis within mid to distal LAD,
Moderate stenosis within proximal and distal RCA, Moderate stenosis
within OM1 branch.

4.  Aortic atherosclerosis.

5. Study is sent for CT-FFR and findings will be performed and
reported separately.

RECOMMENDATIONS:

Moderate stenosis. Consider symptom-guided anti-ischemic
pharmacotherapy as well as risk factor modification per guideline
directed care. Additional analysis with CT FFR will be submitted.

*** End of Addendum ***
EXAM:
OVER-READ INTERPRETATION  CT CHEST

The following report is an over-read performed by radiologist Dr.
Vini Seide [REDACTED] on 08/09/2020. This over-read
does not include interpretation of cardiac or coronary anatomy or
pathology. The coronary CTA interpretation by the cardiologist is
attached.
FINDINGS: Vascular: Heart is normal size. Aorta normal caliber. Scattered
calcifications in the aortic root.

Mediastinum/Nodes: No adenopathy

Lungs/Pleura: No confluent opacities or effusions.

Upper Abdomen: Diffuse hepatic steatosis.

Musculoskeletal: Chest wall soft tissues are unremarkable. No acute
bony abnormality.
IMPRESSION: Scattered aortic calcifications in the aortic root.

No acute extra cardiac abnormality.

Suspect hepatic steatosis.

## 2021-12-31 ENCOUNTER — Other Ambulatory Visit: Payer: Self-pay | Admitting: Cardiology

## 2021-12-31 DIAGNOSIS — I2510 Atherosclerotic heart disease of native coronary artery without angina pectoris: Secondary | ICD-10-CM

## 2021-12-31 DIAGNOSIS — E7800 Pure hypercholesterolemia, unspecified: Secondary | ICD-10-CM

## 2022-03-07 ENCOUNTER — Ambulatory Visit: Payer: BC Managed Care – PPO | Admitting: Cardiology

## 2022-03-13 ENCOUNTER — Ambulatory Visit: Payer: BC Managed Care – PPO | Admitting: Cardiology

## 2022-03-21 ENCOUNTER — Ambulatory Visit: Payer: BC Managed Care – PPO | Admitting: Cardiology

## 2022-03-26 ENCOUNTER — Other Ambulatory Visit: Payer: Self-pay | Admitting: Cardiology

## 2022-03-26 DIAGNOSIS — E7800 Pure hypercholesterolemia, unspecified: Secondary | ICD-10-CM

## 2022-03-26 DIAGNOSIS — I2510 Atherosclerotic heart disease of native coronary artery without angina pectoris: Secondary | ICD-10-CM

## 2022-04-12 LAB — LDL CHOLESTEROL, DIRECT: LDL Direct: 108 mg/dL — ABNORMAL HIGH (ref 0–99)

## 2022-04-12 LAB — CMP14+EGFR
ALT: 24 IU/L (ref 0–44)
AST: 23 IU/L (ref 0–40)
Albumin/Globulin Ratio: 1.8 (ref 1.2–2.2)
Albumin: 4.3 g/dL (ref 3.8–4.9)
Alkaline Phosphatase: 85 IU/L (ref 44–121)
BUN/Creatinine Ratio: 11 (ref 9–20)
BUN: 10 mg/dL (ref 6–24)
Bilirubin Total: 0.5 mg/dL (ref 0.0–1.2)
CO2: 21 mmol/L (ref 20–29)
Calcium: 9 mg/dL (ref 8.7–10.2)
Chloride: 100 mmol/L (ref 96–106)
Creatinine, Ser: 0.9 mg/dL (ref 0.76–1.27)
Globulin, Total: 2.4 g/dL (ref 1.5–4.5)
Glucose: 101 mg/dL — ABNORMAL HIGH (ref 70–99)
Potassium: 4.3 mmol/L (ref 3.5–5.2)
Sodium: 137 mmol/L (ref 134–144)
Total Protein: 6.7 g/dL (ref 6.0–8.5)
eGFR: 101 mL/min/{1.73_m2} (ref >59)

## 2022-04-12 LAB — LIPID PANEL WITH LDL/HDL RATIO
Cholesterol, Total: 166 mg/dL (ref 100–199)
HDL: 36 mg/dL — ABNORMAL LOW (ref >39)
LDL Chol Calc (NIH): 110 mg/dL — ABNORMAL HIGH (ref 0–99)
LDL/HDL Ratio: 3.1 ratio (ref 0.0–3.6)
Triglycerides: 111 mg/dL (ref 0–149)
VLDL Cholesterol Cal: 20 mg/dL (ref 5–40)

## 2022-04-15 ENCOUNTER — Encounter: Payer: Self-pay | Admitting: Cardiology

## 2022-04-15 ENCOUNTER — Ambulatory Visit: Payer: BC Managed Care – PPO | Admitting: Cardiology

## 2022-04-15 VITALS — BP 129/78 | HR 69 | Temp 97.9°F | Resp 16 | Ht 72.0 in | Wt 220.0 lb

## 2022-04-15 DIAGNOSIS — Z8249 Family history of ischemic heart disease and other diseases of the circulatory system: Secondary | ICD-10-CM

## 2022-04-15 DIAGNOSIS — Z87891 Personal history of nicotine dependence: Secondary | ICD-10-CM

## 2022-04-15 DIAGNOSIS — I2510 Atherosclerotic heart disease of native coronary artery without angina pectoris: Secondary | ICD-10-CM

## 2022-04-15 DIAGNOSIS — E7800 Pure hypercholesterolemia, unspecified: Secondary | ICD-10-CM

## 2022-04-15 NOTE — Progress Notes (Signed)
Date:  04/15/2022   ID:  Phillip Clark, DOB [birth year], MRN [id number]  PCP:  Percell Belt, DO  Cardiologist: Rex Kras, DO, FACC (established care 08/03/2020)  Date: 04/15/22 Last Office Visit: 05/15/2021   Chief Complaint  Patient presents with   Coronary Artery Disease   Hyperlipidemia   Follow-up    HPI  Phillip Clark is a 56 y.o. male whose past medical history and cardiovascular risk factors include: Severe coronary artery calcification, moderate coronary artery disease per cardiac CTA, family history of premature coronary artery disease, pure hypercholesterolemia, former smoker, myalgias to statin therapy.  In the past referred to the practice for evaluation of chest pain.  Underwent ischemic work-up including a left heart catheterization which noted nonobstructive CAD and he was encouraged to focus on lifestyle changes and improve her modifiable cardiovascular risk factors.  In the past his LDL levels will be as high as 199 mg/dL.  With initiation of statin therapy his lipids have improved we will.  Most recent labs from September 2023 independently reviewed.  LDL slightly trending up.  Functional capacity remains relatively stable.  Denies anginal discomfort or heart failure symptoms.  ALLERGIES: Allergies  Allergen Reactions   Crestor [Rosuvastatin Calcium] Other (See Comments)    MYALGIAS   Latex    Rosuvastatin     Other reaction(s): Myalgias (intolerance)   Topiramate     Other reaction(s): Other (See Comments) Tingling legs Tingling legs    Varenicline     Other reaction(s): Other (See Comments) Suicidal idea Suicidal idea    Tape Rash    "takes skins off"    MEDICATION LIST PRIOR TO VISIT: Current Meds  Medication Sig   aspirin EC 81 MG tablet Take 1 tablet (81 mg total) by mouth daily. Swallow whole.   atorvastatin (LIPITOR) 40 MG tablet TAKE 1 TABLET BY MOUTH AT BEDTIME   metFORMIN (GLUCOPHAGE) 500 MG tablet Take 1 tablet (500 mg total)  by mouth 2 (two) times daily with a meal. (Patient taking differently: Take 500 mg by mouth daily.)   metoprolol succinate (TOPROL-XL) 50 MG 24 hr tablet Take 1 tablet (50 mg total) by mouth in the morning. Take with or immediately following a meal.   Multiple Vitamin (MULTIVITAMIN ADULT PO) Take 1 tablet by mouth daily.   Omega-3 Fatty Acids (FISH OIL) 1000 MG CAPS Take 1 capsule by mouth daily.     PAST MEDICAL HISTORY: Past Medical History:  Diagnosis Date   Coronary artery calcification of native artery    Essential tremor    Hyperlipidemia    Hypertension    Nonobstructive atherosclerosis of coronary artery     PAST SURGICAL HISTORY: Past Surgical History:  Procedure Laterality Date   HERNIA REPAIR     LEFT HEART CATH AND CORONARY ANGIOGRAPHY N/A 10/02/2020   Procedure: LEFT HEART CATH AND CORONARY ANGIOGRAPHY;  Surgeon: Nigel Mormon, MD;  Location: [facility];  Service: Cardiovascular;  Laterality: N/A;   VASECTOMY      FAMILY HISTORY: The patient family history includes Diabetes in his mother; Heart attack in his father; Hyperlipidemia in his brother, father, mother, sister, sister, sister, and sister; Hypertension in his brother, father, mother, and sister; Thyroid disease in his mother.  SOCIAL HISTORY:  The patient  reports that he quit smoking about 5 years ago. His smoking use included cigarettes. He has a 34.00 pack-year smoking history. He has never used smokeless tobacco. He reports that he does not currently use alcohol.  He reports that he does not use drugs.  REVIEW OF SYSTEMS: Review of Systems  Constitutional: Negative for chills and fever.  HENT:  Negative for hoarse voice and nosebleeds.   Eyes:  Negative for discharge, double vision and pain.  Cardiovascular:  Negative for chest pain, claudication, dyspnea on exertion, leg swelling, near-syncope, orthopnea, palpitations, paroxysmal nocturnal dyspnea and syncope.  Respiratory:  Negative for  hemoptysis and shortness of breath.   Musculoskeletal:  Negative for muscle cramps and myalgias.  Gastrointestinal:  Negative for abdominal pain, constipation, diarrhea, hematemesis, hematochezia, melena, nausea and vomiting.  Neurological:  Negative for dizziness and light-headedness.    PHYSICAL EXAM:    04/15/2022    1:57 PM 06/07/2021    7:20 PM 06/07/2021    7:02 PM  Vitals with BMI  Height _0  _1  _2   Weight 220 lbs 230 lbs 230 lbs  BMI 29.83 52.08 02.23  Systolic 361    Diastolic 78    Pulse 69      Physical Exam  Constitutional: No distress.  Age appropriate, hemodynamically stable.   Neck: No JVD present.  Cardiovascular: Normal rate, regular rhythm, S1 normal, S2 normal, intact distal pulses and normal pulses. Exam reveals no gallop, no S3 and no S4.  No murmur heard. Pulmonary/Chest: Effort normal and breath sounds normal. No stridor. He has no wheezes. He has no rales.  Abdominal: Soft. Bowel sounds are normal. He exhibits no distension. There is no abdominal tenderness.  Musculoskeletal:        General: No edema.     Cervical back: Neck supple.  Neurological: He is alert and oriented to person, place, and time. He has intact cranial nerves (2-12).  Skin: Skin is warm and moist.   CARDIAC DATABASE: EKG: 04/15/2022: Normal sinus rhythm, 68 bpm, LAE, without underlying ischemia or injury pattern.  Echocardiogram: 09/30/2020:  LVEF 60-65%, no regional wall motion abnormalities, normal diastolic function, mild dilatation aortic root at 40 mm.   Stress Testing: No results found for this or any previous visit from the past 1095 days.  Heart Catheterization: 10/02/2020:  LM: Normal  LAD: Diffuse luminal irregularities with prox 20%, mid 30% stenoses  LCx: Prox 30%, OM1 30% stenoses   RCA: Diffuse luminal irregularities with prox, mid, and  RPDA 20% stenoses  LVEDP 10 mmHg Mild CAD, normal LVEDP Symptoms of dyspnea and chest pain at rest out of  proportion to angiography findings Consider alternate etiology  CCTA:  08/09/2020: 1. Coronary calcium score of 534. This was 98th percentile for age and sex matched control. 2. Normal coronary origin with right dominance. 3. CAD-RADS = 3. Moderate stenosis within mid to distal LAD, Moderate stenosis within proximal and distal RCA, Moderate stenosis within OM1 branch. 4.  Aortic atherosclerosis. 1.  CT FFR analysis showed no significant stenosis. RECOMMENDATIONS: Goal directed medical therapy and aggressive risk factor modification for secondary prevention of coronary artery disease.  LABORATORY DATA:    Latest Ref Rng & Units 02/09/2021    9:16 PM 10/02/2020    2:35 AM 10/01/2020    4:28 AM  CBC  WBC 4.0 - 10.5 K/uL 6.5  5.4  5.3   Hemoglobin 13.0 - 17.0 g/dL 12.7  13.7  13.6   Hematocrit 39.0 - 52.0 % 37.1  38.7  38.6   Platelets 150 - 400 K/uL 305  272  282        Latest Ref Rng & Units 04/11/2022    1:37 PM 05/03/2021  8:42 AM 02/09/2021    9:16 PM  CMP  Glucose 70 - 99 mg/dL 101  123  370   BUN 6 - 24 mg/dL _0 Creatinine 0.76 - 1.27 mg/dL 0.90  1.07  1.12   Sodium 134 - 144 mmol/L 137  138  135   Potassium 3.5 - 5.2 mmol/L 4.3  4.9  3.6   Chloride 96 - 106 mmol/L 100  100  101   CO2 20 - 29 mmol/L _1 Calcium 8.7 - 10.2 mg/dL 9.0  9.4  8.9   Total Protein 6.0 - 8.5 g/dL 6.7  7.4  6.6   Total Bilirubin 0.0 - 1.2 mg/dL 0.5  0.6  0.6   Alkaline Phos 44 - 121 IU/L 85  79  63   AST 0 - 40 IU/L _2 ALT 0 - 44 IU/L _3 Lipid Panel  Lab Results  Component Value Date   CHOL 166 04/11/2022   HDL 36 (L) 04/11/2022   LDLCALC 110 (H) 04/11/2022   LDLDIRECT 108 (H) 04/11/2022   TRIG 111 04/11/2022   CHOLHDL 5.0 10/02/2020    Hepatic Function Panel Recent Labs    05/03/21 0842 04/11/22 1337  PROT 7.4 6.7  ALBUMIN 4.8 4.3  AST 30 23  ALT 28 24  ALKPHOS 79 85  BILITOT 0.6 0.5    Lab Results  Component Value Date   CHOL 166  04/11/2022   HDL 36 (L) 04/11/2022   LDLCALC 110 (H) 04/11/2022   LDLDIRECT 108 (H) 04/11/2022   TRIG 111 04/11/2022   CHOLHDL 5.0 10/02/2020    External Labs: Collected: 06/29/2020, Care Everywhere [facility] Hemoglobin 14.1 g/dL, hematocrit 40.9% Creatinine 0.94 mg/dL. eGFR: >90 mL/min per 1.73 m Lipid profile: Total cholesterol 239, triglycerides 130, HDL 45, LDL 199, non-HDL 194 AST 18, ALT 23, alkaline phosphatase 60   IMPRESSION:    ICD-10-CM   1. Coronary atherosclerosis due to calcified coronary lesion  I25.10 EKG 12-Lead   I25.84     2. Pure hypercholesterolemia  E78.00     3. Family history of premature CAD  Z82.49     4. Former smoker  Z87.891        RECOMMENDATIONS: Phillip Clark is a 56 y.o. male whose past medical history and cardiac risk factors include: Severe coronary artery calcification, moderate coronary artery disease per cardiac CTA, family history of premature coronary artery disease, pure hypercholesterolemia, former smoker, myalgias to statin therapy.  Coronary atherosclerosis due to calcified coronary lesion/nonobstructive CAD No angina pectoris last 1 year. No use of sublingual nitroglycerin tablets. EKG: Nonischemic. Labs from 03/2022 independently reviewed and noted above for further reference. Reemphasized the importance of improving his modifiable cardiovascular risk factors. No additional cardiovascular testing warranted at this time.  Pure hypercholesterolemia Indexed LDL levels were as high as 199 mg/dL. Has done well with statin therapy. Most recent lipids noted uptrend in his LDL levels.  Per patient, likely due to poor compliance with diet. He is not completely sure of the dose of atorvastatin he is currently taking.  But is willing to uptitrate.  We will await his phone call for this information.  Former smoker Educated on the importance of continued smoking cessation.  FINAL MEDICATION LIST END OF  ENCOUNTER: No orders of the defined types were placed in this encounter.  There are no discontinued medications.    Current Outpatient Medications:    aspirin EC 81 MG tablet, Take 1 tablet (81 mg total) by mouth daily. Swallow whole., Disp: 90 tablet, Rfl: 3   atorvastatin (LIPITOR) 40 MG tablet, TAKE 1 TABLET BY MOUTH AT BEDTIME, Disp: 90 tablet, Rfl: 0   metFORMIN (GLUCOPHAGE) 500 MG tablet, Take 1 tablet (500 mg total) by mouth 2 (two) times daily with a meal. (Patient taking differently: Take 500 mg by mouth daily.), Disp: 60 tablet, Rfl: 0   metoprolol succinate (TOPROL-XL) 50 MG 24 hr tablet, Take 1 tablet (50 mg total) by mouth in the morning. Take with or immediately following a meal., Disp: 90 tablet, Rfl: 1   Multiple Vitamin (MULTIVITAMIN ADULT PO), Take 1 tablet by mouth daily., Disp: , Rfl:    Omega-3 Fatty Acids (FISH OIL) 1000 MG CAPS, Take 1 capsule by mouth daily., Disp: , Rfl:   Orders Placed This Encounter  Procedures   EKG 12-Lead   There are no Patient Instructions on file for this visit.  --Continue cardiac medications as reconciled in final medication list. --Return in about 1 year (around 04/16/2023) for Follow up, Coronary artery calcification, Lipid. Or sooner if needed. --Continue follow-up with your primary care physician regarding the management of your other chronic comorbid conditions.  Patient's questions and concerns were addressed to his satisfaction. He voices understanding of the instructions provided during this encounter.   This note was created using a voice recognition software as a result there may be grammatical errors inadvertently enclosed that do not reflect the nature of this encounter. Every attempt is made to correct such errors.  Rex Kras, [place], [facility]  Pager: [phone] Office: [phone]

## 2022-04-16 ENCOUNTER — Telehealth: Payer: Self-pay

## 2022-04-16 DIAGNOSIS — I2510 Atherosclerotic heart disease of native coronary artery without angina pectoris: Secondary | ICD-10-CM

## 2022-04-16 DIAGNOSIS — E7800 Pure hypercholesterolemia, unspecified: Secondary | ICD-10-CM

## 2022-04-18 ENCOUNTER — Other Ambulatory Visit: Payer: Self-pay | Admitting: Cardiology

## 2022-04-18 DIAGNOSIS — I2510 Atherosclerotic heart disease of native coronary artery without angina pectoris: Secondary | ICD-10-CM

## 2022-04-18 DIAGNOSIS — E7800 Pure hypercholesterolemia, unspecified: Secondary | ICD-10-CM

## 2022-04-18 MED ORDER — EZETIMIBE 10 MG PO TABS: 10.0000 mg | ORAL_TABLET | Freq: Every day | ORAL | 0 refills | Status: DC

## 2022-04-18 NOTE — Telephone Encounter (Signed)
Will order Zetia 10mg  po qday.  Labs in 6 week to reevaluate lipids and LFTs. Please encourage him to eat less outside and reduce his dietary intake of cholesterol rich foods as well.   Orders have been placed.  We will call him and conveyed the recommendations.  Rex Kras, [place], [facility]  Pager: [phone] Office: [phone]

## 2022-04-22 ENCOUNTER — Other Ambulatory Visit: Payer: Self-pay

## 2022-04-22 DIAGNOSIS — I2510 Atherosclerotic heart disease of native coronary artery without angina pectoris: Secondary | ICD-10-CM

## 2022-04-22 MED ORDER — METOPROLOL SUCCINATE ER 50 MG PO TB24: 50.0000 mg | ORAL_TABLET | Freq: Every morning | ORAL | 1 refills | Status: DC

## 2022-04-22 NOTE — Telephone Encounter (Signed)
Called and spoke to patient he voiced understanding

## 2022-07-13 ENCOUNTER — Other Ambulatory Visit: Payer: Self-pay | Admitting: Cardiology

## 2022-07-13 DIAGNOSIS — E7800 Pure hypercholesterolemia, unspecified: Secondary | ICD-10-CM

## 2022-07-13 DIAGNOSIS — I2510 Atherosclerotic heart disease of native coronary artery without angina pectoris: Secondary | ICD-10-CM

## 2022-07-23 ENCOUNTER — Encounter: Payer: Self-pay | Admitting: Cardiology

## 2022-07-23 ENCOUNTER — Ambulatory Visit: Payer: BC Managed Care – PPO | Admitting: Cardiology

## 2022-07-23 VITALS — BP 113/75 | HR 93 | Temp 100.1°F | Ht 72.0 in | Wt 218.0 lb

## 2022-07-23 DIAGNOSIS — R072 Precordial pain: Secondary | ICD-10-CM

## 2022-07-23 DIAGNOSIS — I2510 Atherosclerotic heart disease of native coronary artery without angina pectoris: Secondary | ICD-10-CM

## 2022-07-23 DIAGNOSIS — Z8249 Family history of ischemic heart disease and other diseases of the circulatory system: Secondary | ICD-10-CM

## 2022-07-23 DIAGNOSIS — Z87891 Personal history of nicotine dependence: Secondary | ICD-10-CM

## 2022-07-23 DIAGNOSIS — E7800 Pure hypercholesterolemia, unspecified: Secondary | ICD-10-CM

## 2022-07-23 MED ORDER — ATORVASTATIN CALCIUM 40 MG PO TABS: 40.0000 mg | ORAL_TABLET | Freq: Every day | ORAL | 0 refills | Status: DC

## 2022-07-23 NOTE — Progress Notes (Signed)
ID:  Phillip Clark, DOB October 31, 1965, MRN [id number]  PCP:  Mattie Marlin, DO  Cardiologist: Tessa Lerner, DO, FACC (established care 08/03/2020)  Date: 07/23/22 Last Office Visit: 04/15/2022   Chief Complaint  Patient presents with   Coronary Artery Disease   Follow-up    Fatigue, headache    HPI  Per Phillip Clark is a 57 y.o. male whose past medical history and cardiovascular risk factors include: Severe coronary artery calcification, moderate coronary artery disease per cardiac CTA, family history of premature coronary artery disease, pure hypercholesterolemia, former smoker, myalgias to statin therapy.  Initially referred to the practice for chest pain.  He underwent workup and during his hospitalization heart catheterization was performed which noted nonobstructive CAD.  Since then we have been working on improving his modifiable cardiovascular risk factors specifically weight loss, lipid management.  In the past his lipids were as high as 199 mg/dL with up titration of medical therapy his LDL levels have improved.  Since last office visit, for reasons unknown he stopped taking his atorvastatin is only taking Zetia.  He presents today for sick visit given his symptoms of shortness of breath, fatigue, headache.  Patient states about 6 to 8 weeks ago he had a cold along with a productive cough and initial labs were negative for infection.  He was not prescribed any antibiotics.  However he has been having productive cough for weeks after that.  At times he notices precordial pain around his left nipple, dull like sensation, at times brought on by effort related activities, and usually self-limited.  No associated symptoms include headache, lightheadedness at times, throat pain esophagus pain.  ALLERGIES: Allergies  Allergen Reactions   Crestor [Rosuvastatin Calcium] Other (See Comments)    MYALGIAS   Latex    Rosuvastatin     Other reaction(s): Myalgias (intolerance)   Topiramate      Other reaction(s): Other (See Comments) Tingling legs Tingling legs    Varenicline     Other reaction(s): Other (See Comments) Suicidal idea Suicidal idea    Tape Rash    "takes skins off"    MEDICATION LIST PRIOR TO VISIT: Current Meds  Medication Sig   aspirin EC 81 MG tablet Take 1 tablet (81 mg total) by mouth daily. Swallow whole.   atorvastatin (LIPITOR) 40 MG tablet Take 1 tablet (40 mg total) by mouth at bedtime.   ezetimibe (ZETIA) 10 MG tablet Take 1 tablet by mouth once daily   magnesium oxide (MAG-OX) 400 MG tablet Take 400 mg by mouth daily.   metoprolol succinate (TOPROL-XL) 50 MG 24 hr tablet Take 1 tablet (50 mg total) by mouth in the morning. Take with or immediately following a meal.   Multiple Vitamin (MULTIVITAMIN ADULT PO) Take 1 tablet by mouth daily.   Omega-3 Fatty Acids (FISH OIL) 1000 MG CAPS Take 1 capsule by mouth daily.     PAST MEDICAL HISTORY: Past Medical History:  Diagnosis Date   Coronary artery calcification of native artery    Essential tremor    Hyperlipidemia    Hypertension    Nonobstructive atherosclerosis of coronary artery     PAST SURGICAL HISTORY: Past Surgical History:  Procedure Laterality Date   HERNIA REPAIR     LEFT HEART CATH AND CORONARY ANGIOGRAPHY N/A 10/02/2020   Procedure: LEFT HEART CATH AND CORONARY ANGIOGRAPHY;  Surgeon: Elder Negus, MD;  Location: [facility];  Service: Cardiovascular;  Laterality: N/A;   VASECTOMY  FAMILY HISTORY: The patient family history includes Diabetes in his mother; Heart attack in his father; Hyperlipidemia in his brother, father, mother, sister, sister, sister, and sister; Hypertension in his brother, father, mother, and sister; Thyroid disease in his mother.  SOCIAL HISTORY:  The patient  reports that he quit smoking about 6 years ago. His smoking use included cigarettes. He has a 34.00 pack-year smoking history. He has never used smokeless tobacco. He reports  that he does not currently use alcohol. He reports that he does not use drugs.  REVIEW OF SYSTEMS: Review of Systems  Constitutional: Positive for malaise/fatigue. Negative for chills and fever.  HENT:  Negative for hoarse voice and nosebleeds.   Eyes:  Negative for discharge, double vision and pain.  Cardiovascular:  Positive for chest pain. Negative for claudication, dyspnea on exertion, leg swelling, near-syncope, orthopnea, palpitations, paroxysmal nocturnal dyspnea and syncope.  Respiratory:  Positive for cough, shortness of breath and sputum production. Negative for hemoptysis.   Musculoskeletal:  Negative for muscle cramps and myalgias.  Gastrointestinal:  Negative for abdominal pain, constipation, diarrhea, hematemesis, hematochezia, melena, nausea and vomiting.  Neurological:  Positive for dizziness, headaches and light-headedness.    PHYSICAL EXAM:    07/23/2022    1:09 PM 04/15/2022    1:57 PM 06/07/2021    7:20 PM  Vitals with BMI  Height 6\' 0"  6\' 0"  6\' 0"   Weight 218 lbs 220 lbs 230 lbs  BMI 29.56 29.83 31.19  Systolic 113 129   Diastolic 75 78   Pulse 93 69     Physical Exam  Constitutional: No distress.  Age appropriate, hemodynamically stable.   Neck: No JVD present.  Cardiovascular: Normal rate, regular rhythm, S1 normal, S2 normal, intact distal pulses and normal pulses. Exam reveals no gallop, no S3 and no S4.  No murmur heard. Pulmonary/Chest: Effort normal and breath sounds normal. No stridor. He has no wheezes. He has no rales.  Abdominal: Soft. Bowel sounds are normal. He exhibits no distension. There is no abdominal tenderness.  Musculoskeletal:        General: No edema.     Cervical back: Neck supple.  Neurological: He is alert and oriented to person, place, and time. He has intact cranial nerves (2-12).  Skin: Skin is warm and moist.   CARDIAC DATABASE: EKG: 04/15/2022: Normal sinus rhythm, 68 bpm, LAE, without underlying ischemia or injury  pattern.  Echocardiogram: 09/30/2020:  LVEF 60-65%, no regional wall motion abnormalities, normal diastolic function, mild dilatation aortic root at 40 mm.   Stress Testing: No results found for this or any previous visit from the past 1095 days.  Heart Catheterization: 10/02/2020:  LM: Normal  LAD: Diffuse luminal irregularities with prox 20%, mid 30% stenoses  LCx: Prox 30%, OM1 30% stenoses   RCA: Diffuse luminal irregularities with prox, mid, and  RPDA 20% stenoses  LVEDP 10 mmHg Mild CAD, normal LVEDP Symptoms of dyspnea and chest pain at rest out of proportion to angiography findings Consider alternate etiology  CCTA:  08/09/2020: 1. Coronary calcium score of 534. This was 98th percentile for age and sex matched control. 2. Normal coronary origin with right dominance. 3. CAD-RADS = 3. Moderate stenosis within mid to distal LAD, Moderate stenosis within proximal and distal RCA, Moderate stenosis within OM1 branch. 4.  Aortic atherosclerosis. 1.  CT FFR analysis showed no significant stenosis. RECOMMENDATIONS: Goal directed medical therapy and aggressive risk factor modification for secondary prevention of coronary artery disease.  LABORATORY DATA:  Latest Ref Rng & Units 02/09/2021    9:16 PM 10/02/2020    2:35 AM 10/01/2020    4:28 AM  CBC  WBC 4.0 - 10.5 K/uL 6.5  5.4  5.3   Hemoglobin 13.0 - 17.0 g/dL 12.7  13.7  13.6   Hematocrit 39.0 - 52.0 % 37.1  38.7  38.6   Platelets 150 - 400 K/uL 305  272  282        Latest Ref Rng & Units 04/11/2022    1:37 PM 05/03/2021    8:42 AM 02/09/2021    9:16 PM  CMP  Glucose 70 - 99 mg/dL 101  123  370   BUN 6 - 24 mg/dL 10  20  16    Creatinine 0.76 - 1.27 mg/dL 0.90  1.07  1.12   Sodium 134 - 144 mmol/L 137  138  135   Potassium 3.5 - 5.2 mmol/L 4.3  4.9  3.6   Chloride 96 - 106 mmol/L 100  100  101   CO2 20 - 29 mmol/L 21  23  25    Calcium 8.7 - 10.2 mg/dL 9.0  9.4  8.9   Total Protein 6.0 - 8.5 g/dL 6.7  7.4  6.6    Total Bilirubin 0.0 - 1.2 mg/dL 0.5  0.6  0.6   Alkaline Phos 44 - 121 IU/L 85  79  63   AST 0 - 40 IU/L 23  30  22    ALT 0 - 44 IU/L 24  28  27      Lipid Panel  Lab Results  Component Value Date   CHOL 166 04/11/2022   HDL 36 (L) 04/11/2022   LDLCALC 110 (H) 04/11/2022   LDLDIRECT 108 (H) 04/11/2022   TRIG 111 04/11/2022   CHOLHDL 5.0 10/02/2020    Hepatic Function Panel Recent Labs    04/11/22 1337  PROT 6.7  ALBUMIN 4.3  AST 23  ALT 24  ALKPHOS 85  BILITOT 0.5    Lab Results  Component Value Date   CHOL 166 04/11/2022   HDL 36 (L) 04/11/2022   LDLCALC 110 (H) 04/11/2022   LDLDIRECT 108 (H) 04/11/2022   TRIG 111 04/11/2022   CHOLHDL 5.0 10/02/2020    External Labs: Collected: 06/29/2020, Care Everywhere [facility] Hemoglobin 14.1 g/dL, hematocrit 40.9% Creatinine 0.94 mg/dL. eGFR: >90 mL/min per 1.73 m Lipid profile: Total cholesterol 239, triglycerides 130, HDL 45, LDL 199, non-HDL 194 AST 18, ALT 23, alkaline phosphatase 60   IMPRESSION:    ICD-10-CM   1. Precordial pain  R07.2 EKG 12-Lead    PCV ECHOCARDIOGRAM COMPLETE    PCV CARDIAC STRESS TEST    2. Chest pain of uncertain etiology  U72.5     3. Coronary atherosclerosis due to calcified coronary lesion  I25.10 PCV ECHOCARDIOGRAM COMPLETE   I25.84 Lipid Panel With LDL/HDL Ratio    LDL cholesterol, direct    Comp. Metabolic Panel (12)    4. Pure hypercholesterolemia  E78.00 Lipid Panel With LDL/HDL Ratio    LDL cholesterol, direct    Comp. Metabolic Panel (12)    5. Family history of premature CAD  Z82.49     6. Former smoker  Z87.891        RECOMMENDATIONS: Phillip Clark is a 57 y.o. male whose past medical history and cardiac risk factors include: Severe coronary artery calcification, moderate coronary artery disease per cardiac CTA, family history of premature coronary artery disease, pure hypercholesterolemia, former smoker,  myalgias to statin  therapy.  Precordial pain Coronary atherosclerosis due to calcified coronary lesion Is precordial pain appears to be noncardiac based on symptoms. No use of sublingual nitroglycerin tablets EKG: Nonischemic. Given the prolonged duration of symptoms he is concerned of possible progression of CAD.  Prior echo and heart catheterization results reviewed.  Shared decision was to repeat an echocardiogram and a GXT to evaluate for further functional capacity and exercise-induced ischemia/arrhythmia  Pure hypercholesterolemia Indexed LDL levels were as high as 199 mg/dL. Currently on Zetia 10 mg p.o. daily. I have asked him to restart atorvastatin 40 mg p.o. nightly. Labs in 6 weeks to reevaluate therapy.   Monitor for now.  Former smoker Reemphasized the importance of continued smoking cessation.  FINAL MEDICATION LIST END OF ENCOUNTER: Meds ordered this encounter  Medications   atorvastatin (LIPITOR) 40 MG tablet    Sig: Take 1 tablet (40 mg total) by mouth at bedtime.    Dispense:  90 tablet    Refill:  0   Medications Discontinued During This Encounter  Medication Reason   atorvastatin (LIPITOR) 40 MG tablet Discontinued by provider   metFORMIN (GLUCOPHAGE) 500 MG tablet Discontinued by provider    Current Outpatient Medications:    aspirin EC 81 MG tablet, Take 1 tablet (81 mg total) by mouth daily. Swallow whole., Disp: 90 tablet, Rfl: 3   atorvastatin (LIPITOR) 40 MG tablet, Take 1 tablet (40 mg total) by mouth at bedtime., Disp: 90 tablet, Rfl: 0   ezetimibe (ZETIA) 10 MG tablet, Take 1 tablet by mouth once daily, Disp: 90 tablet, Rfl: 0   magnesium oxide (MAG-OX) 400 MG tablet, Take 400 mg by mouth daily., Disp: , Rfl:    metoprolol succinate (TOPROL-XL) 50 MG 24 hr tablet, Take 1 tablet (50 mg total) by mouth in the morning. Take with or immediately following a meal., Disp: 90 tablet, Rfl: 1   Multiple Vitamin (MULTIVITAMIN ADULT PO), Take 1 tablet by mouth daily., Disp: ,  Rfl:    Omega-3 Fatty Acids (FISH OIL) 1000 MG CAPS, Take 1 capsule by mouth daily., Disp: , Rfl:   Orders Placed This Encounter  Procedures   Lipid Panel With LDL/HDL Ratio   LDL cholesterol, direct   Comp. Metabolic Panel (12)   PCV CARDIAC STRESS TEST   EKG 12-Lead   PCV ECHOCARDIOGRAM COMPLETE   There are no Patient Instructions on file for this visit.  --Continue cardiac medications as reconciled in final medication list. --Return in about 7 weeks (around 09/10/2022) for Follow up, CAD. Or sooner if needed. --Continue follow-up with your primary care physician regarding the management of your other chronic comorbid conditions.  Patient's questions and concerns were addressed to his satisfaction. He voices understanding of the instructions provided during this encounter.   This note was created using a voice recognition software as a result there may be grammatical errors inadvertently enclosed that do not reflect the nature of this encounter. Every attempt is made to correct such errors.  Tessa Lerner, [place], [facility]  Pager: [phone] Office: [phone]

## 2022-07-24 ENCOUNTER — Other Ambulatory Visit: Payer: Self-pay

## 2022-07-24 DIAGNOSIS — E7800 Pure hypercholesterolemia, unspecified: Secondary | ICD-10-CM

## 2022-07-24 DIAGNOSIS — I2510 Atherosclerotic heart disease of native coronary artery without angina pectoris: Secondary | ICD-10-CM

## 2022-07-24 LAB — CMP14+EGFR
ALT: 20 IU/L (ref 0–44)
AST: 23 IU/L (ref 0–40)
Albumin/Globulin Ratio: 1.8 (ref 1.2–2.2)
Albumin: 4.5 g/dL (ref 3.8–4.9)
Alkaline Phosphatase: 84 IU/L (ref 44–121)
BUN/Creatinine Ratio: 12 (ref 9–20)
BUN: 11 mg/dL (ref 6–24)
Bilirubin Total: 0.3 mg/dL (ref 0.0–1.2)
CO2: 23 mmol/L (ref 20–29)
Calcium: 9 mg/dL (ref 8.7–10.2)
Chloride: 98 mmol/L (ref 96–106)
Creatinine, Ser: 0.89 mg/dL (ref 0.76–1.27)
Globulin, Total: 2.5 g/dL (ref 1.5–4.5)
Glucose: 100 mg/dL — ABNORMAL HIGH (ref 70–99)
Potassium: 4.4 mmol/L (ref 3.5–5.2)
Sodium: 135 mmol/L (ref 134–144)
Total Protein: 7 g/dL (ref 6.0–8.5)
eGFR: 102 mL/min/{1.73_m2} (ref >59)

## 2022-07-24 LAB — LIPID PANEL WITH LDL/HDL RATIO
Cholesterol, Total: 202 mg/dL — ABNORMAL HIGH (ref 100–199)
HDL: 35 mg/dL — ABNORMAL LOW (ref >39)
LDL Chol Calc (NIH): 151 mg/dL — ABNORMAL HIGH (ref 0–99)
LDL/HDL Ratio: 4.3 ratio — ABNORMAL HIGH (ref 0.0–3.6)
Triglycerides: 86 mg/dL (ref 0–149)
VLDL Cholesterol Cal: 16 mg/dL (ref 5–40)

## 2022-07-24 LAB — LDL CHOLESTEROL, DIRECT: LDL Direct: 152 mg/dL — ABNORMAL HIGH (ref 0–99)

## 2022-07-24 NOTE — Progress Notes (Signed)
Spoke with patient, he acknowledged all the information given. I answered all of his questions, ordered labs, and released them.

## 2022-07-29 ENCOUNTER — Ambulatory Visit: Payer: BC Managed Care – PPO

## 2022-07-29 DIAGNOSIS — R072 Precordial pain: Secondary | ICD-10-CM

## 2022-07-30 ENCOUNTER — Other Ambulatory Visit: Payer: Self-pay | Admitting: Cardiology

## 2022-07-30 DIAGNOSIS — I493 Ventricular premature depolarization: Secondary | ICD-10-CM

## 2022-07-30 NOTE — Progress Notes (Signed)
Called patient to inform him about his stress test results. Patient understood. Came you schedule him for a 3 day monitor the same day as his echo please. Patient already knows.

## 2022-08-01 ENCOUNTER — Ambulatory Visit: Payer: BC Managed Care – PPO

## 2022-08-01 DIAGNOSIS — R072 Precordial pain: Secondary | ICD-10-CM

## 2022-08-01 DIAGNOSIS — I493 Ventricular premature depolarization: Secondary | ICD-10-CM

## 2022-08-01 DIAGNOSIS — I2510 Atherosclerotic heart disease of native coronary artery without angina pectoris: Secondary | ICD-10-CM

## 2022-08-30 ENCOUNTER — Other Ambulatory Visit: Payer: Self-pay

## 2022-08-30 MED ORDER — METOPROLOL SUCCINATE ER 100 MG PO TB24: 100.0000 mg | ORAL_TABLET | Freq: Every day | ORAL | 3 refills | Status: DC

## 2022-08-30 NOTE — Progress Notes (Signed)
Gave patient results, he acknowledged understanding and had no further questions. Called in metoprolol 100 mg and he knows to discontinue the 50 mg

## 2022-09-06 LAB — COMP. METABOLIC PANEL (12)
AST: 23 IU/L (ref 0–40)
Albumin/Globulin Ratio: 2 (ref 1.2–2.2)
Albumin: 4.5 g/dL (ref 3.8–4.9)
Alkaline Phosphatase: 90 IU/L (ref 44–121)
BUN/Creatinine Ratio: 9 (ref 9–20)
BUN: 9 mg/dL (ref 6–24)
Bilirubin Total: 0.5 mg/dL (ref 0.0–1.2)
Calcium: 9.3 mg/dL (ref 8.7–10.2)
Chloride: 100 mmol/L (ref 96–106)
Creatinine, Ser: 0.95 mg/dL (ref 0.76–1.27)
Globulin, Total: 2.2 g/dL (ref 1.5–4.5)
Glucose: 120 mg/dL — ABNORMAL HIGH (ref 70–99)
Potassium: 5 mmol/L (ref 3.5–5.2)
Sodium: 139 mmol/L (ref 134–144)
Total Protein: 6.7 g/dL (ref 6.0–8.5)
eGFR: 95 mL/min/{1.73_m2} (ref >59)

## 2022-09-06 LAB — LIPID PANEL WITH LDL/HDL RATIO
Cholesterol, Total: 131 mg/dL (ref 100–199)
HDL: 37 mg/dL — ABNORMAL LOW (ref >39)
LDL Chol Calc (NIH): 74 mg/dL (ref 0–99)
LDL/HDL Ratio: 2 ratio (ref 0.0–3.6)
Triglycerides: 108 mg/dL (ref 0–149)
VLDL Cholesterol Cal: 20 mg/dL (ref 5–40)

## 2022-09-06 LAB — LDL CHOLESTEROL, DIRECT: LDL Direct: 72 mg/dL (ref 0–99)

## 2022-09-10 ENCOUNTER — Ambulatory Visit: Payer: BC Managed Care – PPO | Admitting: Cardiology

## 2022-09-10 ENCOUNTER — Encounter: Payer: Self-pay | Admitting: Cardiology

## 2022-09-10 VITALS — BP 128/80 | HR 84 | Ht 72.0 in | Wt 220.0 lb

## 2022-09-10 DIAGNOSIS — I2510 Atherosclerotic heart disease of native coronary artery without angina pectoris: Secondary | ICD-10-CM

## 2022-09-10 DIAGNOSIS — I493 Ventricular premature depolarization: Secondary | ICD-10-CM

## 2022-09-10 DIAGNOSIS — R072 Precordial pain: Secondary | ICD-10-CM

## 2022-09-10 DIAGNOSIS — Z87891 Personal history of nicotine dependence: Secondary | ICD-10-CM

## 2022-09-10 DIAGNOSIS — E7800 Pure hypercholesterolemia, unspecified: Secondary | ICD-10-CM

## 2022-09-10 NOTE — Progress Notes (Signed)
ID:  Phillip Clark, DOB December 21, 1965, MRN [id number]  PCP:  Percell Belt, DO  Cardiologist: Rex Kras, DO, [facility] (established care 08/03/2020)  Date: 09/10/22 Last Office Visit: 07/23/2022   Chief Complaint  Patient presents with   Follow-up    Precordial pain, PVCs.    HPI  Phillip Clark is a 57 y.o. male whose past medical history and cardiovascular risk factors include: Severe coronary artery calcification, moderate coronary artery disease per cardiac CTA, family history of premature coronary artery disease, pure hypercholesterolemia, former smoker, myalgias to statin therapy.  Patient is being followed by the practice given his underlying heart disease.  During his prior hospitalizations he underwent angiography and was noted to have nonobstructive CAD.  Since then he been working on improving his modifiable cardiovascular risk factors such as weight loss, lipid management.  At the last office visit he had symptoms of precordial pain which appeared to be noncardiac.  However given his history and concern shared decision was to proceed with echo and GXT.  Results reviewed with him and noted below for further reference.  Both on the echo and GXT he was noted to have frequent PVCs.  He followed up having a Zio patch and his PVC burden was noted to be 12.1% and tachycardia burden of 12% with an average heart rate of 88 bpm.  He now presents for follow-up.  His overall symptoms have improved significantly since last office visit.  He still has noncardiac chest pain.  His palpitations have improved with up titration of Toprol-XL from 50 mg to 100 mg p.o. daily.  He is accompanied by his wife at today's visit.  ALLERGIES: Allergies  Allergen Reactions   Crestor [Rosuvastatin Calcium] Other (See Comments)    MYALGIAS   Rosuvastatin Other (See Comments)    Other reaction(s): Myalgias (intolerance)  Other Reaction(s): Myalgias (intolerance)  MYALGIAS  Other reaction(s): Myalgias  (intolerance)  MYALGIAS, Other reaction(s): Myalgias (intolerance)   Latex    Topiramate     Other reaction(s): Other (See Comments) Tingling legs Tingling legs    Varenicline     Other reaction(s): Other (See Comments) Suicidal idea Suicidal idea    Tape Rash    "takes skins off"    MEDICATION LIST PRIOR TO VISIT: Current Meds  Medication Sig   aspirin EC 81 MG tablet Take 1 tablet (81 mg total) by mouth daily. Swallow whole.   atorvastatin (LIPITOR) 40 MG tablet Take 1 tablet (40 mg total) by mouth at bedtime.   ezetimibe (ZETIA) 10 MG tablet Take 1 tablet by mouth once daily   metoprolol succinate (TOPROL-XL) 100 MG 24 hr tablet Take 1 tablet (100 mg total) by mouth daily. Take with or immediately following a meal.     PAST MEDICAL HISTORY: Past Medical History:  Diagnosis Date   Coronary artery calcification of native artery    Essential tremor    Hyperlipidemia    Hypertension    Nonobstructive atherosclerosis of coronary artery     PAST SURGICAL HISTORY: Past Surgical History:  Procedure Laterality Date   HERNIA REPAIR     LEFT HEART CATH AND CORONARY ANGIOGRAPHY N/A 10/02/2020   Procedure: LEFT HEART CATH AND CORONARY ANGIOGRAPHY;  Surgeon: Nigel Mormon, MD;  Location: [facility];  Service: Cardiovascular;  Laterality: N/A;   VASECTOMY      FAMILY HISTORY: The patient family history includes Diabetes in his mother; Heart attack in his father; Hyperlipidemia in his brother, father, mother, sister, sister,  sister, and sister; Hypertension in his brother, father, mother, and sister; Thyroid disease in his mother.  SOCIAL HISTORY:  The patient  reports that he quit smoking about 6 years ago. His smoking use included cigarettes. He has a 34.00 pack-year smoking history. He has never used smokeless tobacco. He reports that he does not currently use alcohol. He reports that he does not use drugs.  REVIEW OF SYSTEMS: Review of Systems   Constitutional: Negative for chills, fever and malaise/fatigue.  HENT:  Negative for hoarse voice and nosebleeds.   Eyes:  Negative for discharge, double vision and pain.  Cardiovascular:  Positive for chest pain. Negative for claudication, dyspnea on exertion, leg swelling, near-syncope, orthopnea, palpitations, paroxysmal nocturnal dyspnea and syncope.  Respiratory:  Negative for cough, hemoptysis, shortness of breath and sputum production.   Musculoskeletal:  Negative for muscle cramps and myalgias.  Gastrointestinal:  Negative for abdominal pain, constipation, diarrhea, hematemesis, hematochezia, melena, nausea and vomiting.  Neurological:  Negative for dizziness, headaches and light-headedness.    PHYSICAL EXAM:    09/10/2022   11:01 AM 07/23/2022    1:09 PM 04/15/2022    1:57 PM  Vitals with BMI  Height '6\' 0"'$  '6\' 0"'$  '6\' 0"'$   Weight 220 lbs 218 lbs 220 lbs  BMI 29.83 [id number]  Systolic 0000000 [id number] [id number]  Diastolic 80 75 78  Pulse 84 93 69    Physical Exam  Constitutional: No distress.  Age appropriate, hemodynamically stable.   Neck: No JVD present.  Cardiovascular: Normal rate, regular rhythm, S1 normal, S2 normal, intact distal pulses and normal pulses. Exam reveals no gallop, no S3 and no S4.  No murmur heard. Pulmonary/Chest: Effort normal and breath sounds normal. No stridor. He has no wheezes. He has no rales.  Abdominal: Soft. Bowel sounds are normal. He exhibits no distension. There is no abdominal tenderness.  Musculoskeletal:        General: No edema.     Cervical back: Neck supple.  Neurological: He is alert and oriented to person, place, and time. He has intact cranial nerves (2-12).  Skin: Skin is warm and moist.   CARDIAC DATABASE: EKG: 04/15/2022: Normal sinus rhythm, 68 bpm, LAE, without underlying ischemia or injury pattern.  Echocardiogram: 09/30/2020:  LVEF 60-65%, no regional wall motion abnormalities, normal diastolic function, mild dilatation aortic root  at 40 mm.  08/01/2022: Normal LV systolic function with visual EF 65-70%. Left ventricle cavity is normal in size. Normal left ventricular wall thickness. Normal global wall motion. Normal diastolic filling pattern, normal LAP.  No significant valvular heart disease. Compared to 09/30/2020 otherwise no significant change.    Stress Testing: Exercise treadmill stress test 07/29/2022: Exercise treadmill stress test performed using Bruce protocol. Patient reached 10.1 METS, and 85% of age predicted maximum heart rate. Exercise capacity was good. No chest pain reported. Normal heart rate and hemodynamic response. Stress EKG revealed no ischemic changes. Frequent PVCs during stress and recovery. Low risk study.   Heart Catheterization: 10/02/2020:  LM: Normal  LAD: Diffuse luminal irregularities with prox 20%, mid 30% stenoses  LCx: Prox 30%, OM1 30% stenoses   RCA: Diffuse luminal irregularities with prox, mid, and  RPDA 20% stenoses  LVEDP 10 mmHg Mild CAD, normal LVEDP Symptoms of dyspnea and chest pain at rest out of proportion to angiography findings Consider alternate etiology  CCTA:  08/09/2020: 1. Coronary calcium score of 534. This was 98th percentile for age and sex matched control. 2. Normal coronary origin with right  dominance. 3. CAD-RADS = 3. Moderate stenosis within mid to distal LAD, Moderate stenosis within proximal and distal RCA, Moderate stenosis within OM1 branch. 4.  Aortic atherosclerosis. 1.  CT FFR analysis showed no significant stenosis. RECOMMENDATIONS: Goal directed medical therapy and aggressive risk factor modification for secondary prevention of coronary artery disease.  Cardiac monitor (Zio Patch): 08/01/2022 -08/07/2022 Dominant rhythm sinus, followed by tachycardia (12% burden). Heart rate 57-126 bpm. Avg HR 88 bpm. No atrial fibrillation, supraventricular or ventricular tachycardia, high grade AV block, pauses (3 seconds or longer). Total ventricular  ectopic burden 12.1% (predominantly as isolated beats). Total supraventricular ectopic burden <1%. Patient triggered events: 8. Underlying rhythm sinus with PVCs either an isolated beats or in bigeminy/trigeminy pattern.   LABORATORY DATA:    Latest Ref Rng & Units 02/09/2021    9:16 PM 10/02/2020    2:35 AM 10/01/2020    4:28 AM  CBC  WBC 4.0 - 10.5 K/uL 6.5  5.4  5.3   Hemoglobin 13.0 - 17.0 g/dL 12.7  13.7  13.6   Hematocrit 39.0 - 52.0 % 37.1  38.7  38.6   Platelets 150 - 400 K/uL 305  272  282        Latest Ref Rng & Units 09/05/2022   12:44 PM 07/23/2022    2:15 PM 04/11/2022    1:37 PM  CMP  Glucose 70 - 99 mg/dL 120  100  101   BUN 6 - 24 mg/dL '9  11  10   '$ Creatinine 0.76 - 1.27 mg/dL 0.95  0.89  0.90   Sodium 134 - 144 mmol/L 139  135  137   Potassium 3.5 - 5.2 mmol/L 5.0  4.4  4.3   Chloride 96 - 106 mmol/L 100  98  100   CO2 20 - 29 mmol/L  23  21   Calcium 8.7 - 10.2 mg/dL 9.3  9.0  9.0   Total Protein 6.0 - 8.5 g/dL 6.7  7.0  6.7   Total Bilirubin 0.0 - 1.2 mg/dL 0.5  0.3  0.5   Alkaline Phos 44 - 121 IU/L 90  84  85   AST 0 - 40 IU/L '23  23  23   '$ ALT 0 - 44 IU/L  20  24     Lipid Panel  Lab Results  Component Value Date   CHOL 131 09/05/2022   HDL 37 (L) 09/05/2022   LDLCALC 74 09/05/2022   LDLDIRECT 72 09/05/2022   TRIG 108 09/05/2022   CHOLHDL 5.0 10/02/2020    Hepatic Function Panel Recent Labs    04/11/22 1337 07/23/22 1415 09/05/22 1244  PROT 6.7 7.0 6.7  ALBUMIN 4.3 4.5 4.5  AST '23 23 23  '$ ALT 24 20  --   ALKPHOS 85 84 90  BILITOT 0.5 0.3 0.5    Lab Results  Component Value Date   CHOL 131 09/05/2022   HDL 37 (L) 09/05/2022   LDLCALC 74 09/05/2022   LDLDIRECT 72 09/05/2022   TRIG 108 09/05/2022   CHOLHDL 5.0 10/02/2020    External Labs: Collected: 06/29/2020, Care Everywhere [facility] Hemoglobin 14.1 g/dL, hematocrit 40.9% Creatinine 0.94 mg/dL. eGFR: >90 mL/min per 1.73 m Lipid profile: Total  cholesterol 239, triglycerides 130, HDL 45, LDL 199, non-HDL 194 AST 18, ALT 23, alkaline phosphatase 60   IMPRESSION:    ICD-10-CM   1. Precordial pain  R07.2     2. Coronary atherosclerosis due to calcified coronary lesion  I25.10    I25.84     3. PVC (premature ventricular contraction)  I49.3     4. Pure hypercholesterolemia  E78.00     5. Former smoker  Z87.891        RECOMMENDATIONS: Phillip Clark is a 57 y.o. male whose past medical history and cardiac risk factors include: Severe coronary artery calcification, moderate coronary artery disease per cardiac CTA, family history of premature coronary artery disease, pure hypercholesterolemia, former smoker, myalgias to statin therapy.  Precordial pain Coronary atherosclerosis due to calcified coronary lesion Precordial pain appears to be noncardiac. Since last office visit symptoms are improving but not resolved. GXT low risk, echocardiogram no significant change in LVEF or regional wall motion abnormalities. Educated on seeking medical attention sooner by going to the closest ER via EMS if the symptoms increase in intensity, frequency, duration, or has typical chest pain as discussed in the office.  Patient verbalized understanding.  PVC (premature ventricular contraction) Improved Toprol-XL increased from 50 mg to 100 mg p.o. daily. Will reconsider 3-day Zio patch in 6 months to reevaluate PVC burden  Pure hypercholesterolemia His indexed LDL was as high as 199 mg/dL. However, in the interim he stopped taking pharmacological therapy and his lipids had worsened in January 2024. After reinitiation of pharmacological therapy his lipids are better controlled. Continue current therapy. Monitor for now  FINAL MEDICATION LIST END OF ENCOUNTER: No orders of the defined types were placed in this encounter.  There are no discontinued medications.   Current Outpatient Medications:    aspirin EC 81 MG tablet, Take 1 tablet  (81 mg total) by mouth daily. Swallow whole., Disp: 90 tablet, Rfl: 3   atorvastatin (LIPITOR) 40 MG tablet, Take 1 tablet (40 mg total) by mouth at bedtime., Disp: 90 tablet, Rfl: 0   ezetimibe (ZETIA) 10 MG tablet, Take 1 tablet by mouth once daily, Disp: 90 tablet, Rfl: 0   metoprolol succinate (TOPROL-XL) 100 MG 24 hr tablet, Take 1 tablet (100 mg total) by mouth daily. Take with or immediately following a meal., Disp: 90 tablet, Rfl: 3   magnesium oxide (MAG-OX) 400 MG tablet, Take 400 mg by mouth daily. (Patient not taking: Reported on 09/10/2022), Disp: , Rfl:    Multiple Vitamin (MULTIVITAMIN ADULT PO), Take 1 tablet by mouth daily. (Patient not taking: Reported on 09/10/2022), Disp: , Rfl:    Omega-3 Fatty Acids (FISH OIL) 1000 MG CAPS, Take 1 capsule by mouth daily. (Patient not taking: Reported on 09/10/2022), Disp: , Rfl:   No orders of the defined types were placed in this encounter.  There are no Patient Instructions on file for this visit.  --Continue cardiac medications as reconciled in final medication list. --Return in about 6 months (around 03/11/2023) for Follow up PVC. Or sooner if needed. --Continue follow-up with your primary care physician regarding the management of your other chronic comorbid conditions.  Patient's questions and concerns were addressed to his satisfaction. He voices understanding of the instructions provided during this encounter.   This note was created using a voice recognition software as a result there may be grammatical errors inadvertently enclosed that do not reflect the nature of this encounter. Every attempt is made to correct such errors.  Rex Kras, [place], [facility]  Pager: [phone] Office: [phone]

## 2022-10-28 ENCOUNTER — Other Ambulatory Visit: Payer: Self-pay | Admitting: Cardiology

## 2022-10-28 DIAGNOSIS — I2510 Atherosclerotic heart disease of native coronary artery without angina pectoris: Secondary | ICD-10-CM

## 2022-11-23 ENCOUNTER — Other Ambulatory Visit: Payer: Self-pay | Admitting: Cardiology

## 2022-11-23 DIAGNOSIS — E7800 Pure hypercholesterolemia, unspecified: Secondary | ICD-10-CM

## 2022-11-23 DIAGNOSIS — I2510 Atherosclerotic heart disease of native coronary artery without angina pectoris: Secondary | ICD-10-CM

## 2023-01-20 ENCOUNTER — Encounter (HOSPITAL_BASED_OUTPATIENT_CLINIC_OR_DEPARTMENT_OTHER): Payer: Self-pay

## 2023-01-20 ENCOUNTER — Other Ambulatory Visit: Payer: Self-pay

## 2023-01-20 ENCOUNTER — Emergency Department (HOSPITAL_BASED_OUTPATIENT_CLINIC_OR_DEPARTMENT_OTHER): Payer: BC Managed Care – PPO

## 2023-01-20 ENCOUNTER — Emergency Department (HOSPITAL_BASED_OUTPATIENT_CLINIC_OR_DEPARTMENT_OTHER): Payer: BC Managed Care – PPO | Admitting: Radiology

## 2023-01-20 ENCOUNTER — Emergency Department (HOSPITAL_BASED_OUTPATIENT_CLINIC_OR_DEPARTMENT_OTHER)
Admission: EM | Admit: 2023-01-20 | Discharge: 2023-01-20 | Disposition: A | Discharge status: Home / Self Care | Payer: BC Managed Care – PPO | Attending: Emergency Medicine | Admitting: Emergency Medicine

## 2023-01-20 DIAGNOSIS — I493 Ventricular premature depolarization: Secondary | ICD-10-CM

## 2023-01-20 DIAGNOSIS — Z91040 Latex allergy status: Secondary | ICD-10-CM | POA: Diagnosis not present

## 2023-01-20 DIAGNOSIS — Z7982 Long term (current) use of aspirin: Secondary | ICD-10-CM | POA: Insufficient documentation

## 2023-01-20 DIAGNOSIS — R079 Chest pain, unspecified: Secondary | ICD-10-CM | POA: Diagnosis present

## 2023-01-20 DIAGNOSIS — Z87891 Personal history of nicotine dependence: Secondary | ICD-10-CM | POA: Insufficient documentation

## 2023-01-20 DIAGNOSIS — I2510 Atherosclerotic heart disease of native coronary artery without angina pectoris: Secondary | ICD-10-CM | POA: Diagnosis not present

## 2023-01-20 LAB — CBC
HCT: 39.4 % (ref 39.0–52.0)
Hemoglobin: 13.8 g/dL (ref 13.0–17.0)
MCH: 30.7 pg (ref 26.0–34.0)
MCHC: 35 g/dL (ref 30.0–36.0)
MCV: 87.8 fL (ref 80.0–100.0)
Platelets: 283 10*3/uL (ref 150–400)
RBC: 4.49 MIL/uL (ref 4.22–5.81)
RDW: 12.1 % (ref 11.5–15.5)
WBC: 7.6 10*3/uL (ref 4.0–10.5)
nRBC: 0 % (ref 0.0–0.2)

## 2023-01-20 LAB — BASIC METABOLIC PANEL
Anion gap: 8 (ref 5–15)
BUN: 19 mg/dL (ref 6–20)
CO2: 22 mmol/L (ref 22–32)
Calcium: 9.2 mg/dL (ref 8.9–10.3)
Chloride: 102 mmol/L (ref 98–111)
Creatinine, Ser: 0.76 mg/dL (ref 0.61–1.24)
GFR, Estimated: >60 mL/min (ref >60)
Glucose, Bld: 281 mg/dL — ABNORMAL HIGH (ref 70–99)
Potassium: 3.9 mmol/L (ref 3.5–5.1)
Sodium: 132 mmol/L — ABNORMAL LOW (ref 135–145)

## 2023-01-20 LAB — TROPONIN I (HIGH SENSITIVITY)
Troponin I (High Sensitivity): 3 ng/L (ref <18)
Troponin I (High Sensitivity): 4 ng/L (ref <18)

## 2023-01-20 LAB — D-DIMER, QUANTITATIVE: D-Dimer, Quant: <0.27 ug/mL-FEU (ref 0.00–0.50)

## 2023-01-20 MED ORDER — IOHEXOL 350 MG/ML SOLN
100.0000 mL | Freq: Once | INTRAVENOUS | Status: AC | PRN
  Administered 2023-01-20: 85 mL via INTRAVENOUS

## 2023-01-20 NOTE — ED Triage Notes (Signed)
Patient here POV from Home.  Endorses CP for 6 Weeks. More Constant today. Associated with fatigue. Some SOB at Times. Mostly always Left Sided.   NAD Noted during Triage. A&Ox4. Gcs 15. Ambulatory.

## 2023-01-20 NOTE — ED Notes (Signed)
Spoke with lab to add on d-dimer 

## 2023-01-20 NOTE — Discharge Instructions (Signed)
Please read and follow all provided instructions.  Your diagnoses today include:  1. Left-sided chest pain   2. PVC's (premature ventricular contractions)     Tests performed today include: An EKG of your heart: Shows frequent PVCs A chest x-ray Cardiac enzymes - a blood test for heart muscle damage Blood counts and electrolytes D-dimer: Screening test for blood clot was negative CT scan of the chest and aorta did not show any abnormalities such as blood clot in the lungs, aortic dissection or other problems with the blood vessels Vital signs. See below for your results today.   Medications prescribed:  None  Take any prescribed medications only as directed.  Follow-up instructions: Please follow-up with your cardiologist as soon as you can for further evaluation of your symptoms.   Return instructions:  SEEK IMMEDIATE MEDICAL ATTENTION IF: You have severe chest pain, especially if the pain is crushing or pressure-like and spreads to the arms, back, neck, or jaw, or if you have sweating, nausea or vomiting, or trouble with breathing. THIS IS AN EMERGENCY. Do not wait to see if the pain will go away. Get medical help at once. Call 911. DO NOT drive yourself to the hospital.  Your chest pain gets worse and does not go away after a few minutes of rest.  You have an attack of chest pain lasting longer than what you usually experience.  You have significant dizziness, if you pass out, or have trouble walking.  You have chest pain not typical of your usual pain for which you originally saw your caregiver.  You have any other emergent concerns regarding your health.  Additional Information: Chest pain comes from many different causes. Your caregiver has diagnosed you as having chest pain that is not specific for one problem, but does not require admission.  You are at low risk for an acute heart condition or other serious illness.   Your vital signs today were: BP (!) 129/93   Pulse 68    Temp (!) 97.5 F (36.4 C)   Resp 20   Ht 6' (1.829 m)   Wt 97.5 kg   SpO2 96%   BMI 29.16 kg/m  If your blood pressure (BP) was elevated above 135/85 this visit, please have this repeated by your doctor within one month. --------------

## 2023-01-20 NOTE — ED Notes (Signed)
Discharge paperwork given and verbally understood. 

## 2023-01-20 NOTE — ED Provider Notes (Signed)
[place] EMERGENCY DEPARTMENT AT [facility] Provider Note   CSN: [id number] Arrival date & time: 01/20/23  1335     History  Chief Complaint  Patient presents with   Chest Pain    Phillip Clark is a 57 y.o. male.  Patient with past medical history including severe coronary artery calcification, moderate coronary artery disease per cardiac CTA, family history of premature coronary artery disease, pure hypercholesterolemia, former smoker, myalgias to statin therapy, followed by Dr. Odis Hollingshead --presents to the emergency department today for evaluation of left-sided chest pain.  Patient describes episodes of very intense chest pains which last several minutes.  It started about 6 weeks ago.  He went to an emergency clinic and was given a medicine which may have helped, but cannot member the name.  Pain does not radiate to the back or neck but he has felt it in his bilateral elbows when it occurs.  It occurs when he is active or when he bends over.  No lightheadedness or syncope.  Sometimes it has woken him up from sleep.  No abdominal pain.  He gets pale and sweats when the pain occurs.  He has not yet seen his cardiologist for this. Patient denies risk factors for pulmonary embolism including: unilateral leg swelling, history of DVT/PE/other blood clots, use of exogenous hormones, recent immobilizations, recent surgery, recent travel (>4hr segment), malignancy, hemoptysis.          Home Medications Prior to Admission medications   Medication Sig Start Date End Date Taking? Authorizing Provider  aspirin EC 81 MG tablet Take 1 tablet (81 mg total) by mouth daily. Swallow whole. 08/22/20   Tolia, Sunit, DO  atorvastatin (LIPITOR) 40 MG tablet Take 1 tablet (40 mg total) by mouth at bedtime. 07/23/22 10/21/22  Tolia, Sunit, DO  ezetimibe (ZETIA) 10 MG tablet Take 1 tablet by mouth once daily 11/25/22   Tolia, Sunit, DO  magnesium oxide (MAG-OX) 400 MG tablet Take 400 mg by mouth  daily. Patient not taking: Reported on 09/10/2022    [provider]  metoprolol succinate (TOPROL-XL) 100 MG 24 hr tablet Take 1 tablet (100 mg total) by mouth daily. Take with or immediately following a meal. 08/30/22   Tolia, Sunit, DO  metoprolol succinate (TOPROL-XL) 50 MG 24 hr tablet TAKE 1 TABLET BY MOUTH IN THE MORNING TAKE  WITH  OR  IMMEDIATELY FOLLOWING A  MEAL 10/28/22   Tolia, Sunit, DO  Multiple Vitamin (MULTIVITAMIN ADULT PO) Take 1 tablet by mouth daily. Patient not taking: Reported on 09/10/2022    [provider]  Omega-3 Fatty Acids (FISH OIL) 1000 MG CAPS Take 1 capsule by mouth daily. Patient not taking: Reported on 09/10/2022    [provider]      Allergies    Crestor [rosuvastatin calcium], Rosuvastatin, Latex, Topiramate, Varenicline, and Tape    Review of Systems   Review of Systems  Physical Exam Updated Vital Signs BP 130/84 (BP Location: Left Arm)   Pulse 76   Temp (!) 97.5 F (36.4 C)   Resp 20   Ht 6' (1.829 m)   Wt 97.5 kg   SpO2 99%   BMI 29.16 kg/m   Physical Exam Vitals and nursing note reviewed.  Constitutional:      Appearance: He is well-developed. He is not diaphoretic.  HENT:     Head: Normocephalic and atraumatic.     Mouth/Throat:     Mouth: Mucous membranes are not dry.  Eyes:  Conjunctiva/sclera: Conjunctivae normal.  Neck:     Vascular: Normal carotid pulses. No carotid bruit or JVD.     Trachea: Trachea normal. No tracheal deviation.  Cardiovascular:     Rate and Rhythm: Normal rate and regular rhythm. Extrasystoles are present.    Pulses: No decreased pulses.          Radial pulses are 2+ on the right side and 2+ on the left side.     Heart sounds: Normal heart sounds, S1 normal and S2 normal. Heart sounds not distant. No murmur heard. Pulmonary:     Effort: Pulmonary effort is normal. No respiratory distress.     Breath sounds: Normal breath sounds. No wheezing.  Chest:     Chest wall: No  tenderness.  Abdominal:     General: Bowel sounds are normal.     Palpations: Abdomen is soft.     Tenderness: There is no abdominal tenderness. There is no guarding or rebound.  Musculoskeletal:     Cervical back: Normal range of motion and neck supple. No muscular tenderness.     Right lower leg: No tenderness. No edema.     Left lower leg: No tenderness. No edema.  Skin:    General: Skin is warm and dry.     Coloration: Skin is not pale.  Neurological:     Mental Status: He is alert. Mental status is at baseline.  Psychiatric:        Mood and Affect: Mood normal.     ED Results / Procedures / Treatments   Labs (all labs ordered are listed, but only abnormal results are displayed) Labs Reviewed  BASIC METABOLIC PANEL - Abnormal; Notable for the following components:      Result Value   Sodium 132 (*)    Glucose, Bld 281 (*)    All other components within normal limits  CBC  D-DIMER, QUANTITATIVE  TROPONIN I (HIGH SENSITIVITY)  TROPONIN I (HIGH SENSITIVITY)    ED ECG REPORT   Date: 01/20/2023  Rate: 77  Rhythm: normal sinus rhythm  QRS Axis: normal  Intervals: normal  ST/T Wave abnormalities: nonspecific T wave changes  Conduction Disutrbances:none  Narrative Interpretation:   Old EKG Reviewed: changes noted  I have personally reviewed the EKG tracing and agree with the computerized printout as noted.   Radiology CT Angio Chest/Abd/Pel for Dissection W and/or Wo Contrast  Result Date: 01/20/2023 CLINICAL DATA:  Acute aortic syndrome suspected. EXAM: CT ANGIOGRAPHY CHEST, ABDOMEN AND PELVIS TECHNIQUE: Non-contrast CT of the chest was initially obtained. Multidetector CT imaging through the chest, abdomen and pelvis was performed using the standard protocol during bolus administration of intravenous contrast. Multiplanar reconstructed images and MIPs were obtained and reviewed to evaluate the vascular anatomy. RADIATION DOSE REDUCTION: This exam was performed  according to the departmental dose-optimization program which includes automated exposure control, adjustment of the mA and/or kV according to patient size and/or use of iterative reconstruction technique. CONTRAST:  85mL OMNIPAQUE IOHEXOL 350 MG/ML SOLN COMPARISON:  CT abdomen pelvis dated 02/10/2021. FINDINGS: CTA CHEST FINDINGS Cardiovascular: There is no cardiomegaly or pericardial effusion. The thoracic aorta is unremarkable. The origins of the great vessels of the aortic arch appear patent. No pulmonary artery embolus identified. Mediastinum/Nodes: No hilar or mediastinal adenopathy. The esophagus and the thyroid gland are grossly unremarkable. No mediastinal fluid collection. Lungs/Pleura: Lungs are clear. There is no pleural effusion pneumothorax. The central airways are patent. Musculoskeletal: No acute osseous pathology. Review of the MIP images  confirms the above findings. CTA ABDOMEN AND PELVIS FINDINGS VASCULAR Aorta: Mild atherosclerotic calcification. No aneurysmal dilatation or dissection. No periaortic fluid collection. Celiac: Patent without evidence of aneurysm, dissection, vasculitis or significant stenosis. SMA: Patent without evidence of aneurysm, dissection, vasculitis or significant stenosis. Renals: Both renal arteries are patent without evidence of aneurysm, dissection, vasculitis, fibromuscular dysplasia or significant stenosis. IMA: Patent without evidence of aneurysm, dissection, vasculitis or significant stenosis. Inflow: Mild atherosclerotic calcification. No aneurysmal dilatation or dissection. The iliac arteries are patent. Veins: No obvious venous abnormality within the limitations of this arterial phase study. Review of the MIP images confirms the above findings. NON-VASCULAR No intra-abdominal free air or free fluid. Hepatobiliary: The liver is unremarkable. No biliary dilatation. The gallbladder is unremarkable. Pancreas: Unremarkable. No pancreatic ductal dilatation or  surrounding inflammatory changes. Spleen: Normal in size without focal abnormality. Adrenals/Urinary Tract: The adrenal glands unremarkable. The kidneys, visualized ureters, and urinary bladder appear unremarkable. Stomach/Bowel: Sigmoid diverticulosis without active inflammatory changes. Moderate stool throughout the colon. There is no bowel obstruction or active inflammation. The appendix is normal. Lymphatic: No adenopathy. Reproductive: The prostate and seminal vesicles are grossly unremarkable. No pelvic mass. Other: Small fat containing umbilical hernia. Musculoskeletal: Total right hip arthroplasty. No acute osseous pathology. Review of the MIP images confirms the above findings. IMPRESSION: 1. No acute intrathoracic, abdominal, or pelvic pathology. No aortic dissection or aneurysm. 2. Sigmoid diverticulosis. No bowel obstruction. Normal appendix. Electronically Signed   By: Elgie Collard M.D.   On: 01/20/2023 19:01   DG Chest 2 View  Result Date: 01/20/2023 CLINICAL DATA:  Chest pain. EXAM: CHEST - 2 VIEW COMPARISON:  September 29, 2020. FINDINGS: The heart size and mediastinal contours are within normal limits. Both lungs are clear. The visualized skeletal structures are unremarkable. IMPRESSION: No active cardiopulmonary disease. Electronically Signed   By: Lupita Raider M.D.   On: 01/20/2023 14:05    Procedures Procedures    Medications Ordered in ED Medications - No data to display  ED Course/ Medical Decision Making/ A&P    Patient seen and examined. History obtained directly from patient. Work-up including labs, imaging, EKG ordered in triage, if performed, were reviewed.    Labs/EKG: Independently reviewed and interpreted.  This included: CBC unremarkable; BMP with elevated glucose of 281, sodium 132, normal kidney function; troponin, normal at 3.  EKG does have some T wave changes when compared to previous.  Ectopy noted.  Imaging: Independently visualized and interpreted.  This  included: Chest x-ray was negative.  Medications/Fluids: None ordered.  Most recent vital signs reviewed and are as follows: BP 130/84 (BP Location: Left Arm)   Pulse 76   Temp (!) 97.5 F (36.4 C)   Resp 20   Ht 6' (1.829 m)   Wt 97.5 kg   SpO2 99%   BMI 29.16 kg/m   Initial impression: 6 weeks of intermittent left-sided chest pain.  6:17 PM Reassessment performed. Patient appears stable.  He has frequent ectopy on the monitor.  Several episodes have registered as V. tach, however upon review these are all 3 back-to-back PVCs. No longer episodes noted.   Labs personally reviewed and interpreted including: CBC unremarkable; BMP sodium low at 132, glucose is elevated at 281 with normal anion gap; D-dimer less than 0.27, troponin 3 >> 4.   I discussed results with patient at bedside.  He voices that he is extremely concerned about his situation and symptoms.  He states that he knows that there is  something wrong in the left side of his chest.  He feels that if he does not get an answer he does not think he will be alive at the end of the year.  He is not currently feel comfortable waiting until he can get in with cardiology for further workup.  We discussed dangerous and emergent differentials that been addressed thus far.  We have discussed that symptoms would be atypical for something like dissection or PE.  We discussed risks and benefits of imaging in this particular scenario.  He would like more to be done and would like imaging to be performed.  Will order dissection study to rule out other potential causes.  Reviewed pertinent lab work and imaging with patient at bedside. Questions answered.   Most current vital signs reviewed and are as follows: BP 114/88   Pulse 71   Temp (!) 97.5 F (36.4 C)   Resp (!) 23   Ht 6' (1.829 m)   Wt 97.5 kg   SpO2 94%   BMI 29.16 kg/m   Plan: CT dissection study.  7:42 PM Reassessment performed. Patient appears stable, comfortable.  No  recurrent symptoms.  Repeat EKG unchanged.  Imaging personally visualized and interpreted including: CT dissection study of the chest, abdomen, pelvis did not show any acute findings.  Reviewed pertinent lab work and imaging with patient at bedside. Questions answered.   I reviewed and discussed patient case and findings with Dr. Deretha Emory who agrees with plan for close outpatient follow-up.  Most current vital signs reviewed and are as follows: BP (!) 129/93   Pulse 68   Temp (!) 97.5 F (36.4 C)   Resp 20   Ht 6' (1.829 m)   Wt 97.5 kg   SpO2 96%   BMI 29.16 kg/m   Plan: Discharge to home.   Prescriptions written for: None  Return and follow-up instructions: I encouraged patient to return to ED with severe chest pain, especially if the pain is crushing or pressure-like and spreads to the arms, back, neck, or jaw, or if they have associated vomiting, or shortness of breath with the pain, or significant pain with activity, syncope. We discussed that the evaluation here today indicates a low-risk of serious cause of chest pain, including heart trouble or a blood clot, but no evaluation is perfect and chest pain can evolve with time. The patient verbalized understanding and agreed.  I encouraged patient to follow-up with their provider in the next 48 hours for recheck.     Click here for ABCD2, HEART and other calculatorsREFRESH Note before signing :1}                          Medical Decision Making  For this patient's complaint of chest pain, the following emergent conditions were considered on the differential diagnosis: acute coronary syndrome, pulmonary embolism, pneumothorax, myocarditis, pericardial tamponade, aortic dissection, thoracic aortic aneurysm complication, esophageal perforation.   Other causes were also considered including: gastroesophageal reflux disease, musculoskeletal pain including costochondritis, pneumonia/pleurisy, herpes zoster, pericarditis.  In  regards to possibility of ACS, patient has atypical features of pain, non-specific EKG findings and negative troponin(s).   CT dissection study was reassuring.  In regards to possibility of PE, D-dimer negative and CT did not suggest PE.   The patient's vital signs, pertinent lab work and imaging were reviewed and interpreted as discussed in the ED course. Hospitalization was considered for further testing, treatments, or serial exams/observation.  However as patient is well-appearing, has a stable exam, and reassuring studies today, I do not feel that they warrant admission at this time. This plan was discussed with the patient who verbalizes agreement and comfort with this plan and seems reliable and able to return to the Emergency Department with worsening or changing symptoms.           Final Clinical Impression(s) / ED Diagnoses Final diagnoses:  Left-sided chest pain  PVC's (premature ventricular contractions)    Rx / DC Orders ED Discharge Orders     None         Renne Crigler, PA-C 01/20/23 1944    Vanetta Mulders, MD 01/23/23 2248

## 2023-02-26 NOTE — Telephone Encounter (Signed)
Not actin done

## 2023-03-06 ENCOUNTER — Other Ambulatory Visit: Payer: Self-pay | Admitting: Cardiology

## 2023-03-06 DIAGNOSIS — I2510 Atherosclerotic heart disease of native coronary artery without angina pectoris: Secondary | ICD-10-CM

## 2023-03-06 DIAGNOSIS — E7800 Pure hypercholesterolemia, unspecified: Secondary | ICD-10-CM

## 2023-03-14 ENCOUNTER — Ambulatory Visit: Payer: BC Managed Care – PPO | Admitting: Cardiology

## 2023-03-24 ENCOUNTER — Other Ambulatory Visit: Payer: Self-pay | Admitting: Cardiology

## 2023-03-24 DIAGNOSIS — I2510 Atherosclerotic heart disease of native coronary artery without angina pectoris: Secondary | ICD-10-CM

## 2023-03-24 DIAGNOSIS — E7800 Pure hypercholesterolemia, unspecified: Secondary | ICD-10-CM

## 2023-04-16 ENCOUNTER — Ambulatory Visit: Payer: BC Managed Care – PPO | Admitting: Cardiology

## 2023-05-31 ENCOUNTER — Other Ambulatory Visit: Payer: Self-pay | Admitting: Cardiology

## 2023-08-29 ENCOUNTER — Other Ambulatory Visit: Payer: Self-pay | Admitting: Cardiology

## 2023-08-30 ENCOUNTER — Other Ambulatory Visit: Payer: Self-pay | Admitting: Cardiology

## 2023-08-30 DIAGNOSIS — E7800 Pure hypercholesterolemia, unspecified: Secondary | ICD-10-CM

## 2023-08-30 DIAGNOSIS — I2510 Atherosclerotic heart disease of native coronary artery without angina pectoris: Secondary | ICD-10-CM

## 2023-09-11 ENCOUNTER — Ambulatory Visit: Payer: Self-pay | Admitting: Cardiology

## 2023-09-18 ENCOUNTER — Ambulatory Visit: Payer: BC Managed Care – PPO | Attending: Cardiology | Admitting: Cardiology

## 2023-09-18 ENCOUNTER — Encounter: Payer: Self-pay | Admitting: Cardiology

## 2023-09-18 ENCOUNTER — Ambulatory Visit: Attending: Cardiology

## 2023-09-18 VITALS — BP 122/68 | HR 71 | Resp 17 | Ht 72.0 in | Wt 200.0 lb

## 2023-09-18 DIAGNOSIS — I493 Ventricular premature depolarization: Secondary | ICD-10-CM | POA: Diagnosis not present

## 2023-09-18 DIAGNOSIS — E7800 Pure hypercholesterolemia, unspecified: Secondary | ICD-10-CM

## 2023-09-18 DIAGNOSIS — I2510 Atherosclerotic heart disease of native coronary artery without angina pectoris: Secondary | ICD-10-CM | POA: Diagnosis not present

## 2023-09-18 DIAGNOSIS — F17210 Nicotine dependence, cigarettes, uncomplicated: Secondary | ICD-10-CM

## 2023-09-18 DIAGNOSIS — I2584 Coronary atherosclerosis due to calcified coronary lesion: Secondary | ICD-10-CM

## 2023-09-18 DIAGNOSIS — R072 Precordial pain: Secondary | ICD-10-CM

## 2023-09-18 DIAGNOSIS — E1165 Type 2 diabetes mellitus with hyperglycemia: Secondary | ICD-10-CM

## 2023-09-18 MED ORDER — ISOSORBIDE MONONITRATE ER 30 MG PO TB24
15.0000 mg | ORAL_TABLET | Freq: Every day | ORAL | 3 refills | Status: DC
Start: 2023-09-18 — End: 2023-10-17

## 2023-09-18 NOTE — Progress Notes (Signed)
 Cardiology Office Note:  .   Date:  09/20/2023  ID:  Phillip Clark, DOB 15-Apr-1966, MRN [id number] PCP:  Orpha Bur, MD  Former Cardiology Providers: NA [facility] Providers Cardiologist:  Tessa Lerner, DO , [facility] (established care 08/03/2020 ) Electrophysiologist:  None  Click to update primary MD,subspecialty MD or APP then REFRESH:1}    Chief Complaint  Patient presents with   Precordial pain   Coronary atherosclerosis due to calcified coronary lesion   Follow-up    6 month     History of Present Illness: .   Phillip Clark is a 58 y.o.  male whose past medical history and cardiovascular risk factors includes: Severe coronary artery calcification, moderate coronary artery disease per cardiac CTA, family history of premature coronary artery disease, pure hypercholesterolemia, former smoker, myalgias to statin therapy.   Patient being followed by the practice given his nonobstructive CAD and premature ventricular contractions.  In the recent past he had precordial discomfort and the shared decision was to proceed with GXT and echocardiogram.  His GXT and noted frequent PVCs and therefore he underwent cardiac monitor which noted a PVC burden of approximately 12.1% and tachycardia burden of 12% with an average heart rate of 88 bpm.  Patient's Toprol-XL has increased from 50 to 100 mg p.o. daily and symptoms have improved.  He presents today for 1 year follow-up visit.  Precordial pain: Endorses episodes 2-3 times per week. Located left-sided, predominantly below the left nipple. More noticeable after exertion. Better with resting and relaxing. When compared to his prior episodes patient states that the more frequent. At times experiences shortness of breath predominantly with exertion.  His PVC burden has improved symptomatically with Toprol-XL at the current dose.  He was recently diagnosed with diabetes about 6 weeks ago with a hemoglobin of 11.5%.  He has been  managing his blood sugars to diet and is currently on Jardiance.  He is planning to follow-up with PCP to repeat his hemoglobin A1c.  He is also started to smoke since last office visit-approximate 0.5 packs/day   Review of Systems: .   Review of Systems  Cardiovascular:  Positive for chest pain. Negative for claudication, irregular heartbeat, leg swelling, near-syncope, orthopnea, palpitations, paroxysmal nocturnal dyspnea and syncope.  Respiratory:  Positive for shortness of breath.   Hematologic/Lymphatic: Negative for bleeding problem.    Studies Reviewed:   EKG: EKG Interpretation Date/Time:  Thursday September 18 2023 13:06:38 EST Ventricular Rate:  71 PR Interval:  172 QRS Duration:  96 QT Interval:  386 QTC Calculation: 419 R Axis:   24  Text Interpretation: Sinus rhythm with occasional Premature ventricular complexes Possible Left atrial enlargement Incomplete right bundle branch block When compared with ECG of 20-Jan-2023 17:09, No significant change since last tracing Confirmed by Tessa Lerner [phone]) on 09/18/2023 1:19:10 PM  Echocardiogram: 08/01/2022: Normal LV systolic function with visual EF 65-70%. Left ventricle cavity is normal in size. Normal left ventricular wall thickness. Normal global wall motion. Normal diastolic filling pattern, normal LAP.  No significant valvular heart disease. Compared to 09/30/2020 otherwise no significant change.   Stress Testing: Exercise treadmill stress test 07/29/2022: Exercise treadmill stress test performed using Bruce protocol. Patient reached 10.1 METS, and 85% of age predicted maximum heart rate. Exercise capacity was good. No chest pain reported. Normal heart rate and hemodynamic response. Stress EKG revealed no ischemic changes. Frequent PVCs during stress and recovery. Low risk study.   Heart Catheterization: 10/02/2020:  LM: Normal  LAD:  Diffuse luminal irregularities with prox 20%, mid 30% stenoses  LCx: Prox 30%, OM1 30%  stenoses   RCA: Diffuse luminal irregularities with prox, mid, and  RPDA 20% stenoses  LVEDP 10 mmHg Mild CAD, normal LVEDP Symptoms of dyspnea and chest pain at rest out of proportion to angiography findings Consider alternate etiology   CCTA:  08/09/2020: 1. Coronary calcium score of 534. This was 98th percentile for age and sex matched control. 2. Normal coronary origin with right dominance. 3. CAD-RADS = 3. Moderate stenosis within mid to distal LAD, Moderate stenosis within proximal and distal RCA, Moderate stenosis within OM1 branch. 4.  Aortic atherosclerosis. 1.  CT FFR analysis showed no significant stenosis. RECOMMENDATIONS: Goal directed medical therapy and aggressive risk factor modification for secondary prevention of coronary artery disease.   Cardiac monitor (Zio Patch): 08/01/2022 -08/07/2022 Dominant rhythm sinus, followed by tachycardia (12% burden). Heart rate 57-126 bpm. Avg HR 88 bpm. No atrial fibrillation, supraventricular or ventricular tachycardia, high grade AV block, pauses (3 seconds or longer). Total ventricular ectopic burden 12.1% (predominantly as isolated beats). Total supraventricular ectopic burden <1%. Patient triggered events: 8. Underlying rhythm sinus with PVCs either an isolated beats or in bigeminy/trigeminy pattern.   RADIOLOGY: NA  Risk Assessment/Calculations:   NA   Labs:       Latest Ref Rng & Units 01/20/2023    1:46 PM 02/09/2021    9:16 PM 10/02/2020    2:35 AM  CBC  WBC 4.0 - 10.5 K/uL 7.6  6.5  5.4   Hemoglobin 13.0 - 17.0 g/dL 16.1  09.6  04.5   Hematocrit 39.0 - 52.0 % 39.4  37.1  38.7   Platelets 150 - 400 K/uL 283  305  272        Latest Ref Rng & Units 01/20/2023    1:46 PM 09/05/2022   12:44 PM 07/23/2022    2:15 PM  BMP  Glucose 70 - 99 mg/dL 409  811  914   BUN 6 - 20 mg/dL 19  9  11    Creatinine 0.61 - 1.24 mg/dL 7.82  9.56  2.13   BUN/Creat Ratio 9 - 20  9  12    Sodium 135 - 145 mmol/L 132  139  135   Potassium  3.5 - 5.1 mmol/L 3.9  5.0  4.4   Chloride 98 - 111 mmol/L 102  100  98   CO2 22 - 32 mmol/L 22   23   Calcium 8.9 - 10.3 mg/dL 9.2  9.3  9.0       Latest Ref Rng & Units 01/20/2023    1:46 PM 09/05/2022   12:44 PM 07/23/2022    2:15 PM  CMP  Glucose 70 - 99 mg/dL 086  578  469   BUN 6 - 20 mg/dL 19  9  11    Creatinine 0.61 - 1.24 mg/dL 6.29  5.28  4.13   Sodium 135 - 145 mmol/L 132  139  135   Potassium 3.5 - 5.1 mmol/L 3.9  5.0  4.4   Chloride 98 - 111 mmol/L 102  100  98   CO2 22 - 32 mmol/L 22   23   Calcium 8.9 - 10.3 mg/dL 9.2  9.3  9.0   Total Protein 6.0 - 8.5 g/dL  6.7  7.0   Total Bilirubin 0.0 - 1.2 mg/dL  0.5  0.3   Alkaline Phos 44 - 121 IU/L  90  84   AST 0 -  40 IU/L  23  23   ALT 0 - 44 IU/L   20     Lab Results  Component Value Date   CHOL 131 09/05/2022   HDL 37 (L) 09/05/2022   LDLCALC 74 09/05/2022   LDLDIRECT 72 09/05/2022   TRIG 108 09/05/2022   CHOLHDL 5.0 10/02/2020   No results for input(s): "LIPOA" in the last 8760 hours. No components found for: "NTPROBNP" No results for input(s): "PROBNP" in the last 8760 hours. No results for input(s): "TSH" in the last 8760 hours.  Physical Exam:    Today's Vitals   09/18/23 1302  BP: 122/68  Pulse: 71  Resp: 17  SpO2: 97%  Weight: 200 lb (90.7 kg)  Height: 6' (1.829 m)   Body mass index is 27.12 kg/m. Wt Readings from Last 3 Encounters:  09/18/23 200 lb (90.7 kg)  01/20/23 215 lb (97.5 kg)  09/10/22 220 lb (99.8 kg)    Physical Exam  Constitutional: No distress.  hemodynamically stable  Neck: No JVD present.  Cardiovascular: Normal rate, regular rhythm, S1 normal and S2 normal. Exam reveals no gallop, no S3 and no S4.  No murmur heard. Pulmonary/Chest: Effort normal and breath sounds normal. No stridor. He has no wheezes. He has no rales.  Musculoskeletal:        General: No edema.     Cervical back: Neck supple.  Skin: Skin is warm.   Impression & Recommendation(s):  Impression:    ICD-10-CM   1. Precordial pain  R07.2 NM PET CT CARDIAC PERFUSION MULTI W/ABSOLUTE BLOODFLOW    Cardiac Stress Test: Informed Consent Details: Physician/Practitioner Attestation; Transcribe to consent form and obtain patient signature    2. Nonobstructive atherosclerosis of coronary artery  I25.10 isosorbide mononitrate (IMDUR) 30 MG 24 hr tablet    Cardiac Stress Test: Informed Consent Details: Physician/Practitioner Attestation; Transcribe to consent form and obtain patient signature    3. Coronary atherosclerosis due to calcified coronary lesion  I25.10 Cardiac Stress Test: Informed Consent Details: Physician/Practitioner Attestation; Transcribe to consent form and obtain patient signature   I25.84     4. Premature ventricular contractions  I49.3 EKG 12-Lead    LONG TERM MONITOR (3-14 DAYS)    5. Pure hypercholesterolemia  E78.00     6. Type 2 diabetes mellitus with hyperglycemia, without long-term current use of insulin (HCC)  E11.65     7. Cigarette smoker  F17.210        Recommendation(s):  Precordial pain Nonobstructive atherosclerosis of coronary artery Coronary atherosclerosis due to calcified coronary lesion Coronary CTA January 2022: Total CAC 534, 94th percentile, moderate CAD, CT FFR did not show any hemodynamically significant stenosis. Since then we have been focusing on improving his modifiable cardiovascular risk factors. Since last office visit he has developed new onset of diabetes with a hemoglobin A1c of 11.5 and he is also started to smoke at least 0.5 packs/day With precordial pain suggestive of cardiac discomfort. Given the PVCs not an ideal candidate for a repeat coronary CTA.  Alternatives discussed including heart catheterization and cardiac PET/CT.  Shared decision was to proceed with the latter. Educated him on seeking medical attention sooner by going to the closest ER via EMS if the symptoms increase in intensity, frequency, duration, or has typical chest  pain as discussed in the office.  Patient verbalized understanding. Continue aspirin 81 mg p.o. daily. Continue Lipitor 40 mg p.o. nightly. Continue Zetia 10 mg p.o. daily. Antianginal therapy: Toprol-XL, also start Imdur 15  mg p.o. every afternoon. Patient is aware of the drug to drug interactions between Imdur and erectile dysfunction medication/BPH medications that fall into the category of PDE 5 inhibitors.  Patient states that he is not on Viagra, Cialis, Levitra, sildenafil, tadalafil, vardenafil, etc.  Premature ventricular contractions Zio patch in 2024 noted a PVC burden of approximately 12.1%. Currently on Toprol-XL 100 mg p.o. daily symptoms have improved. EKG today illustrates sinus rhythm with rare PVCs. Will repeat 3-day Zio patch to reevaluate PVC burden and/or dysrhythmias  Pure hypercholesterolemia Currently on Lipitor 40 mg p.o. nightly, Zetia 10 mg p.o. daily.   He denies myalgia or other side effects. Most recent lipids dated February 2024, independently reviewed as noted above.  LDL is 72 mg/dL. Patient has an appointment to see PCP to have his hemoglobin A1c rechecked, recommended rechecking fasting lipids-recommend a goal LDL <55 mg/dL Cardiology is following peripherally.   Type 2 diabetes mellitus with hyperglycemia, without long-term current use of insulin (HCC) Diagnosed approximately 6 weeks ago-hemoglobin A1c 11.5 per patient. Currently on Jardiance 25 mg p.o. daily. Currently on statin therapy. Consider the addition of ACE inhibitor or ARB for renal protection-will defer to PCP  Cigarette smoker Tobacco cessation counseling: Currently smoking 0.5 packs/day   He is informed of the dangers of tobacco abuse including stroke, cancer, and MI, as well as benefits of tobacco cessation. He is willing to quit at this time. 5 mins were spent counseling patient cessation techniques. We discussed various methods to help quit smoking, including deciding on a date to  quit, joining a support group, pharmacological agents- nicotine gum/patch/lozenges.  I will reassess his progress at the next follow-up visit  Orders Placed:  Orders Placed This Encounter  Procedures   NM PET CT CARDIAC PERFUSION MULTI W/ABSOLUTE BLOODFLOW    Standing Status:   Future    Expiration Date:   09/17/2024    If indicated for the ordered procedure, I authorize the administration of a radiopharmaceutical per Radiology protocol:   Yes   Cardiac Stress Test: Informed Consent Details: Physician/Practitioner Attestation; Transcribe to consent form and obtain patient signature    Physician/Practitioner attestation of informed consent for procedure/surgical case:   I, the physician/practitioner, attest that I have discussed with the patient the benefits, risks, side effects, alternatives, likelihood of achieving goals and potential problems during recovery for the procedure that I have provided informed consent.    Procedure:   Cardiac PET/CT stress test    Indication/Reason:   precordial pain, severe CAC, PVCs   LONG TERM MONITOR (3-14 DAYS)    Standing Status:   Future    Number of Occurrences:   1    Expected Date:   09/25/2023    Expiration Date:   09/17/2024    Where should this test be performed?:   [facility]    Does the patient have an implanted cardiac device?:   No    Prescribed days of wear:   3    Type of enrollment:   Home Enrollment    Vendor::   Zio   EKG 12-Lead    Final Medication List:    Meds ordered this encounter  Medications   isosorbide mononitrate (IMDUR) 30 MG 24 hr tablet    Sig: Take 0.5 tablets (15 mg total) by mouth daily.    Dispense:  30 tablet    Refill:  3    Medications Discontinued During This Encounter  Medication Reason   metoprolol succinate (TOPROL-XL) 50 MG 24  hr tablet Change in therapy     Current Outpatient Medications:    aspirin EC 81 MG tablet, Take 1 tablet (81 mg total) by mouth daily. Swallow whole., Disp: 90 tablet, Rfl:  3   atorvastatin (LIPITOR) 40 MG tablet, TAKE 1 TABLET BY MOUTH AT BEDTIME, Disp: 90 tablet, Rfl: 0   ezetimibe (ZETIA) 10 MG tablet, Take 1 tablet by mouth once daily, Disp: 90 tablet, Rfl: 3   isosorbide mononitrate (IMDUR) 30 MG 24 hr tablet, Take 0.5 tablets (15 mg total) by mouth daily., Disp: 30 tablet, Rfl: 3   JARDIANCE 25 MG TABS tablet, Take 25 mg by mouth daily., Disp: , Rfl:    magnesium oxide (MAG-OX) 400 MG tablet, Take 400 mg by mouth daily., Disp: , Rfl:    metoprolol succinate (TOPROL-XL) 100 MG 24 hr tablet, Take 1 tablet (100 mg total) by mouth daily. Take with or immediately following a meal., Disp: 90 tablet, Rfl: 3   Multiple Vitamin (MULTIVITAMIN ADULT PO), Take 1 tablet by mouth daily., Disp: , Rfl:    Omega-3 Fatty Acids (FISH OIL) 1000 MG CAPS, Take 1 capsule by mouth daily., Disp: , Rfl:   Consent:   Informed Consent   Shared Decision Making/Informed Consent The risks [chest pain, shortness of breath, cardiac arrhythmias, dizziness, blood pressure fluctuations, myocardial infarction, stroke/transient ischemic attack, nausea, vomiting, allergic reaction, radiation exposure, metallic taste sensation and life-threatening complications (estimated to be 1 in 10,000)], benefits (risk stratification, diagnosing coronary artery disease, treatment guidance) and alternatives of a cardiac PET stress test were discussed in detail with Mr. Meleski and he agrees to proceed.     Disposition:   Return in about 3 months (around 12/19/2023) for Follow up, CAD, PVCs, review test results.  Patient may be asked to follow-up sooner based on the results of the above-mentioned testing.  His questions and concerns were addressed to his satisfaction. He voices understanding of the recommendations provided during this encounter.    Signed, Tessa Lerner, DO, [facility]  [facility]  [address] [place], [place] [address] 09/20/2023 11:18 PM

## 2023-09-18 NOTE — Progress Notes (Unsigned)
 Enrolled patient for a 3 day Zio XT monitor to be mailed to patients home

## 2023-09-18 NOTE — Patient Instructions (Addendum)
 Medication Instructions:  Your physician has recommended you make the following change in your medication:   START Isosorbide Mononitrate (Imdur) 15 mg once daily in the evening    *If you need a refill on your cardiac medications before your next appointment, please call your pharmacy*  Lab Work: None ordered today. If you have labs (blood work) drawn today and your tests are completely normal, you will receive your results only by: MyChart Message (if you have MyChart) OR A paper copy in the mail If you have any lab test that is abnormal or we need to change your treatment, we will call you to review the results.  Testing/Procedures: Your physician has requested that you wear a Zio heart monitor for 3 days. This will be mailed to your home with instructions on how to apply the monitor and how to return it when finished. Please allow 2 weeks after returning the heart monitor before our office calls you with the results.   Your physician has requested for you to have a cardiac PET/CT stress test. Someone will reach out to you to schedule this test.  Follow-Up: At [facility], you and your health needs are our priority.  As part of our continuing mission to provide you with exceptional heart care, we have created designated Provider Care Teams.  These Care Teams include your primary Cardiologist (physician) and Advanced Practice Providers (APPs -  Physician Assistants and Nurse Practitioners) who all work together to provide you with the care you need, when you need it.   Your next appointment:   3 month(s)  The format for your next appointment:   In Person  Provider:   Jari Favre, PA-C, Ronie Spies, PA-C, Robin Searing, NP, Jacolyn Reedy, PA-C, Eligha Bridegroom, NP, Tereso Newcomer, PA-C, or Perlie Gold, PA-C   Other Instructions How to Prepare for Your Cardiac PET/CT Stress Test:  1. Please do not take these medications before your test:  ~Medications that may interfere with the  cardiac pharmacological stress agent (ex. nitrates - including erectile dysfunction medications, isosorbide mononitrate, tamulosin or beta-blockers) the day of the exam. (Erectile dysfunction medication should be held for at least 72 hrs prior to test) ~Your remaining medications may be taken with water.  **HOLD Metoprolol Succinate (Toprol-XL) and Isosorbide Mononitrate (Imdur) on the morning of the PET/CT scan**  2. Nothing to eat or drink, except water, 3 hours prior to arrival time.   ~ NO caffeine/decaffeinated products, or chocolate 12 hours prior to arrival.  3. NO perfume, cologne or lotion on chest or abdomen area.  4. Total time is 1 to 2 hours; you may want to bring reading material for the waiting time.  Please report to Radiology at the [facility] Main Entrance 30 minutes early for your test. [address] [place], [place] [address]  In preparation for your appointment, medication and supplies will be purchased.  Appointment availability is limited, so if you need to cancel or reschedule, please call the Radiology Department at [phone] [facility]) OR [phone] [facility])  24 hours in advance to avoid a cancellation fee of $100.00  What to Expect After you Arrive:  Once you arrive and check in for your appointment, you will be taken to a preparation room within the Radiology Department.  A technologist or Nurse will obtain your medical history, verify that you are correctly prepped for the exam, and explain the procedure.  Afterwards,  an IV will be started in your arm and electrodes will  be placed on your skin for EKG monitoring during the stress portion of the exam. Then you will be escorted to the PET/CT scanner.  There, staff will get you positioned on the scanner and obtain a blood pressure and EKG.  During the exam, you will continue to be connected to the EKG and blood pressure machines.  A small, safe amount of a radioactive tracer will be injected in your  IV to obtain a series of pictures of your heart along with an injection of a stress agent.    After your Exam:  It is recommended that you eat a meal and drink a caffeinated beverage to counter act any effects of the stress agent.  Drink plenty of fluids for the remainder of the day and urinate frequently for the first couple of hours after the exam.  Your doctor will inform you of your test results within 7-10 business days.  For more information and frequently asked questions, please visit our website : [email or web address]  For questions about your test or how to prepare for your test, please call: Cardiac Imaging Nurse Navigators Office: [phone]     --------------------------------------------------- Christena Deem- Long Term Monitor Instructions     Your physician has requested you wear a ZIO patch monitor for 3 days.  This is a single patch monitor. [employer] supplies one patch monitor per enrollment. Additional  stickers are not available. Please do not apply patch if you will be having a Nuclear Stress Test,  Echocardiogram, Cardiac CT, MRI, or Chest Xray during the period you would be wearing the  monitor. The patch cannot be worn during these tests. You cannot remove and re-apply the  ZIO XT patch monitor.  Your ZIO patch monitor will be mailed 3 day USPS to your address on file. It may take 3-5 days  to receive your monitor after you have been enrolled.  Once you have received your monitor, please review the enclosed instructions. Your monitor  has already been registered assigning a specific monitor serial # to you.     Billing and Patient Assistance Program Information     We have supplied [employer] with any of your insurance information on file for billing purposes.  [employer] offers a sliding scale Patient Assistance Program for patients that do not have  insurance, or whose insurance does not completely cover the cost of the ZIO monitor.  You must apply for the  Patient Assistance Program to qualify for this discounted rate.  To apply, please call [employer] at [phone], select option 4, select option 2, ask to apply for  Patient Assistance Program. Meredeth Ide will ask your household income, and how many people  are in your household. They will quote your out-of-pocket cost based on that information.  [employer] will also be able to set up a 63-month, interest-free payment plan if needed.     Applying the monitor     Shave hair from upper left chest.  Hold abrader disc by orange tab. Rub abrader in 40 strokes over the upper left chest as  indicated in your monitor instructions.  Clean area with 4 enclosed alcohol pads. Let dry.  Apply patch as indicated in monitor instructions. Patch will be placed under collarbone on left  side of chest with arrow pointing upward.  Rub patch adhesive wings for 2 minutes. Remove white label marked "1". Remove the white  label marked "2". Rub patch adhesive wings for 2 additional minutes.  While looking in a mirror, press and release button  in center of patch. A small green light will  flash 3-4 times. This will be your only indicator that the monitor has been turned on.  Do not shower for the first 24 hours. You may shower after the first 24 hours.  Press the button if you feel a symptom. You will hear a small click. Record Date, Time and  Symptom in the Patient Logbook.  When you are ready to remove the patch, follow instructions on the last 2 pages of Patient  Logbook. Stick patch monitor onto the last page of Patient Logbook.  Place Patient Logbook in the blue and white box. Use locking tab on box and tape box closed  securely. The blue and white box has prepaid postage on it. Please place it in the mailbox as  soon as possible. Your physician should have your test results approximately 7 days after the  monitor has been mailed back to [facility].  Call [facility] Customer Care at [phone] if you have  questions regarding  your ZIO XT patch monitor. Call them immediately if you see an orange light blinking on your  monitor.  If your monitor falls off in less than 4 days, contact our Monitor department at [phone].  If your monitor becomes loose or falls off after 4 days call [employer] at [phone] for  suggestions on securing your monitor.

## 2023-09-20 ENCOUNTER — Encounter: Payer: Self-pay | Admitting: Cardiology

## 2023-10-04 ENCOUNTER — Other Ambulatory Visit: Payer: Self-pay | Admitting: Cardiology

## 2023-10-17 ENCOUNTER — Emergency Department (HOSPITAL_COMMUNITY)
Admission: EM | Admit: 2023-10-17 | Discharge: 2023-10-17 | Disposition: A | Discharge status: Home / Self Care | Attending: Emergency Medicine | Admitting: Emergency Medicine

## 2023-10-17 ENCOUNTER — Emergency Department (HOSPITAL_COMMUNITY)

## 2023-10-17 ENCOUNTER — Encounter (HOSPITAL_COMMUNITY): Payer: Self-pay

## 2023-10-17 ENCOUNTER — Other Ambulatory Visit: Payer: Self-pay

## 2023-10-17 DIAGNOSIS — Z91040 Latex allergy status: Secondary | ICD-10-CM | POA: Insufficient documentation

## 2023-10-17 DIAGNOSIS — E785 Hyperlipidemia, unspecified: Secondary | ICD-10-CM | POA: Diagnosis not present

## 2023-10-17 DIAGNOSIS — I493 Ventricular premature depolarization: Secondary | ICD-10-CM

## 2023-10-17 DIAGNOSIS — F17210 Nicotine dependence, cigarettes, uncomplicated: Secondary | ICD-10-CM | POA: Diagnosis not present

## 2023-10-17 DIAGNOSIS — R42 Dizziness and giddiness: Secondary | ICD-10-CM | POA: Insufficient documentation

## 2023-10-17 DIAGNOSIS — Z79899 Other long term (current) drug therapy: Secondary | ICD-10-CM | POA: Insufficient documentation

## 2023-10-17 DIAGNOSIS — R079 Chest pain, unspecified: Secondary | ICD-10-CM

## 2023-10-17 DIAGNOSIS — I2510 Atherosclerotic heart disease of native coronary artery without angina pectoris: Secondary | ICD-10-CM | POA: Insufficient documentation

## 2023-10-17 DIAGNOSIS — R002 Palpitations: Secondary | ICD-10-CM | POA: Diagnosis not present

## 2023-10-17 DIAGNOSIS — Z7982 Long term (current) use of aspirin: Secondary | ICD-10-CM | POA: Insufficient documentation

## 2023-10-17 DIAGNOSIS — R0789 Other chest pain: Secondary | ICD-10-CM | POA: Insufficient documentation

## 2023-10-17 DIAGNOSIS — I10 Essential (primary) hypertension: Secondary | ICD-10-CM | POA: Diagnosis not present

## 2023-10-17 LAB — CBC WITH DIFFERENTIAL/PLATELET
Abs Immature Granulocytes: 0.01 10*3/uL (ref 0.00–0.07)
Basophils Absolute: 0.1 10*3/uL (ref 0.0–0.1)
Basophils Relative: 1 %
Eosinophils Absolute: 0.2 10*3/uL (ref 0.0–0.5)
Eosinophils Relative: 3 %
HCT: 45.1 % (ref 39.0–52.0)
Hemoglobin: 15.6 g/dL (ref 13.0–17.0)
Immature Granulocytes: 0 %
Lymphocytes Relative: 27 %
Lymphs Abs: 1.9 10*3/uL (ref 0.7–4.0)
MCH: 31 pg (ref 26.0–34.0)
MCHC: 34.6 g/dL (ref 30.0–36.0)
MCV: 89.5 fL (ref 80.0–100.0)
Monocytes Absolute: 0.5 10*3/uL (ref 0.1–1.0)
Monocytes Relative: 7 %
Neutro Abs: 4.6 10*3/uL (ref 1.7–7.7)
Neutrophils Relative %: 62 %
Platelets: 275 10*3/uL (ref 150–400)
RBC: 5.04 MIL/uL (ref 4.22–5.81)
RDW: 12 % (ref 11.5–15.5)
WBC: 7.2 10*3/uL (ref 4.0–10.5)
nRBC: 0 % (ref 0.0–0.2)

## 2023-10-17 LAB — COMPREHENSIVE METABOLIC PANEL WITH GFR
ALT: 28 U/L (ref 0–44)
AST: 28 U/L (ref 15–41)
Albumin: 4.3 g/dL (ref 3.5–5.0)
Alkaline Phosphatase: 55 U/L (ref 38–126)
Anion gap: 7 (ref 5–15)
BUN: 20 mg/dL (ref 6–20)
CO2: 24 mmol/L (ref 22–32)
Calcium: 9.2 mg/dL (ref 8.9–10.3)
Chloride: 108 mmol/L (ref 98–111)
Creatinine, Ser: 1.01 mg/dL (ref 0.61–1.24)
GFR, Estimated: >60 mL/min (ref >60)
Glucose, Bld: 106 mg/dL — ABNORMAL HIGH (ref 70–99)
Potassium: 4.1 mmol/L (ref 3.5–5.1)
Sodium: 139 mmol/L (ref 135–145)
Total Bilirubin: 0.7 mg/dL (ref 0.0–1.2)
Total Protein: 7 g/dL (ref 6.5–8.1)

## 2023-10-17 LAB — TROPONIN I (HIGH SENSITIVITY)
Troponin I (High Sensitivity): 3 ng/L (ref <18)
Troponin I (High Sensitivity): 2 ng/L (ref <18)

## 2023-10-17 LAB — MAGNESIUM: Magnesium: 2.2 mg/dL (ref 1.7–2.4)

## 2023-10-17 MED ORDER — ASPIRIN 81 MG PO CHEW
324.0000 mg | CHEWABLE_TABLET | Freq: Once | ORAL | Status: AC
  Administered 2023-10-17: 324 mg via ORAL
  Filled 2023-10-17: qty 4

## 2023-10-17 MED ORDER — ISOSORBIDE MONONITRATE ER 30 MG PO TB24
30.0000 mg | ORAL_TABLET | Freq: Every day | ORAL | 0 refills | Status: DC
Start: 2023-10-17 — End: 2024-01-02

## 2023-10-17 MED ORDER — NITROGLYCERIN 0.4 MG SL SUBL: 0.4000 mg | SUBLINGUAL_TABLET | SUBLINGUAL | 0 refills | Status: DC | PRN

## 2023-10-17 NOTE — Consult Note (Addendum)
 Cardiology Consultation   Patient ID: Phillip Clark MRN: [id number]; DOB: [birth year]  Admit date: 10/17/2023 Date of Consult: 10/17/2023  PCP:  Orpha Bur, MD    [facility] Providers Cardiologist:  Tessa Lerner, DO      Patient Profile:   Phillip Clark is a 58 y.o. 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 Criss Alvine.  History of Present Illness:   [name] is a 58 year old male with above medical history. He is followed by Dr. Odis Hollingshead.   Patient previously underwent coronary CTA in 07/2020 that showed a coronary calcium score of 534 (98th percentile). There was moderate stenosis in the mid-distal LAD and proximal and distal RCA. Study was sent for [facility] which showed no significant stenosis. Echocardiogram from 09/30/20 showed EF 60-65%, no regional wall motion abnormalities, normal RV systolic function, mild dilatation of the aortic root measuring 40 mm. He underwent cardiac catheterization on 10/02/20 that showed diffuse luminal irregularities (20-30%) in the LAD, LCX, RCA. Overall, CAD was mild and it was felt that patient's symptoms of dyspnea and chest pain at rest were out of proportion to angiography findings. Recommended evaluation for non-cardiac causes of chest pain.   Patient was later seen by Dr. Odis Hollingshead in 07/2022 for evaluation of shortness of breath and fatigue. He also noted chest pain, felt to be noncardiac. He underwent an exercise stress test on 07/2022 that was a low risk study. PVCs were noted during stress and recovery Wore a cardiac monitor that showed predominantly sinus rhythm, PVCs with 12.1% burden. Managed with BB. Echocardiogram from 08/01/22 showed EF 65-70%, no regional wall motion abnormalities, no significant valvular disease.   Patient was most recently seen by Dr.  Odis Hollingshead on 09/18/23. At that time, patient reported episodes of chest pain, shortness of breath. Symptoms seemed to be more noticeable after exertion, improved with rest. A cardiac PET test was ordered. He wore a cardia monitor that showed PVC burden had decreased to 6.1%.   Patient presented to the ED on 4/4 complaining of chest pain that started 2 weeks ago, worsened when bending over. Initial vital signs showed BP 131/71, HR 67 Bpm, oxygen 99% on room air. Labs significant for hsTn 3. CBC, CMP normal aside from glucose 106. CXR showed normal cardio-mediastinal silhouette, no active cardiopulmonary disease.   On interview, patient reports that for the past 2 weeks, he has been having near constant chest pain. Pain is located at one spot in his chest. Seems to worsen if he leans forward to pick something up. He denies chest pain when exerting himself in other ways, such as when walking up the stairs. He denies shortness of breath on exertion. Has occasional palpitations, feels like his heart is beating irregularly. Also gets a bit dizzy if he bends over and stands up. He is currently scheduled for a cardiac PET in a month. He is the primary caregiver for his wife, who had brain surgery in 2017 and has some residual deficits. His symptoms are very similar to what he felt prior to his cath in 2022.   Past Medical History:  Diagnosis Date   Coronary artery calcification of native artery    Essential tremor    Hyperlipidemia    Hypertension    Nonobstructive atherosclerosis of coronary artery  Past Surgical History:  Procedure Laterality Date   HERNIA REPAIR     LEFT HEART CATH AND CORONARY ANGIOGRAPHY N/A 10/02/2020   Procedure: LEFT HEART CATH AND CORONARY ANGIOGRAPHY;  Surgeon: Elder Negus, MD;  Location: [facility];  Service: Cardiovascular;  Laterality: N/A;   VASECTOMY       Home Medications:  Prior to Admission medications   Medication Sig Start Date End Date Taking?  Authorizing Provider  aspirin EC 81 MG tablet Take 1 tablet (81 mg total) by mouth daily. Swallow whole. 08/22/20   Tolia, Sunit, DO  atorvastatin (LIPITOR) 40 MG tablet TAKE 1 TABLET BY MOUTH AT BEDTIME 08/29/23   Tolia, Sunit, DO  ezetimibe (ZETIA) 10 MG tablet Take 1 tablet by mouth once daily 09/01/23   Tolia, Sunit, DO  isosorbide mononitrate (IMDUR) 30 MG 24 hr tablet Take 0.5 tablets (15 mg total) by mouth daily. 09/18/23   Tolia, Sunit, DO  JARDIANCE 25 MG TABS tablet Take 25 mg by mouth daily. 08/01/23   [provider]  magnesium oxide (MAG-OX) 400 MG tablet Take 400 mg by mouth daily.    [provider]  metoprolol succinate (TOPROL-XL) 100 MG 24 hr tablet Take 1 tablet (100 mg total) by mouth daily. WITH OR IMMEDIATELY FOLLOWING A MEAL 10/06/23   Tolia, Sunit, DO  Multiple Vitamin (MULTIVITAMIN ADULT PO) Take 1 tablet by mouth daily.    [provider]  Omega-3 Fatty Acids (FISH OIL) 1000 MG CAPS Take 1 capsule by mouth daily.    [provider]    Inpatient Medications: Scheduled Meds:  Continuous Infusions:  PRN Meds:   Allergies:    Allergies  Allergen Reactions   Crestor [Rosuvastatin Calcium] Other (See Comments)    MYALGIAS   Rosuvastatin Other (See Comments)    Other reaction(s): Myalgias (intolerance)  Other Reaction(s): Myalgias (intolerance)  MYALGIAS  Other reaction(s): Myalgias (intolerance)  MYALGIAS, Other reaction(s): Myalgias (intolerance)   Latex    Topiramate     Other reaction(s): Other (See Comments) Tingling legs Tingling legs    Varenicline     Other reaction(s): Other (See Comments) Suicidal idea Suicidal idea    Tape Rash    "takes skins off"    Social History:   Social History   Socioeconomic History   Marital status: Married    Spouse name: Not on file   Number of children: 0   Years of education: Not on file   Highest education level: Not on file  Occupational History   Not on file  Tobacco  Use   Smoking status: Every Day    Current packs/day: 0.00    Average packs/day: 0.5 packs/day for 34.0 years (17.0 ttl pk-yrs)    Types: Cigarettes    Start date: 73    Last attempt to quit: 2018    Years since quitting: 7.2   Smokeless tobacco: Never  Vaping Use   Vaping status: Never Used  Substance and Sexual Activity   Alcohol use: Not Currently   Drug use: Never   Sexual activity: Yes  Other Topics Concern   Not on file  Social History Narrative   Not on file   Social Drivers of Health   Financial Resource Strain: Not on file  Food Insecurity: Not on file  Transportation Needs: Not on file  Physical Activity: Not on file  Stress: Not on file  Social Connections: Unknown (11/23/2021)   Received from [facility], [facility]   Social  Network    Social Network: Not on file  Intimate Partner Violence: Unknown (10/16/2021)   Received from [employer], [facility]   HITS    Physically Hurt: Not on file    Insult or Talk Down To: Not on file    Threaten Physical Harm: Not on file    Scream or Curse: Not on file    Family History:    Family History  Problem Relation Age of Onset   Hypertension Mother    Hyperlipidemia Mother    Diabetes Mother    Thyroid disease Mother    Heart attack Father    Hypertension Father    Hyperlipidemia Father    Hypertension Sister    Hyperlipidemia Sister    Hyperlipidemia Sister    Hyperlipidemia Sister    Hyperlipidemia Sister    Hypertension Brother    Hyperlipidemia Brother      ROS:  Please see the history of present illness.   All other ROS reviewed and negative.     Physical Exam/Data:   Vitals:   10/17/23 0952 10/17/23 0957  BP: 131/71   Pulse: 67   Resp: (!) 21   Temp: 98.2 F (36.8 C)   TempSrc: Oral   SpO2: 99%   Weight:  86.2 kg  Height:  6' (1.829 m)   No intake or output data in the 24 hours ending 10/17/23 1146    10/17/2023    9:57 AM 09/18/2023    1:02 PM 01/20/2023    1:43 PM  Last 3  Weights  Weight (lbs) 190 lb 200 lb 215 lb  Weight (kg) 86.183 kg 90.719 kg 97.523 kg     Body mass index is 25.77 kg/m.  General:  Well nourished, well developed, in no acute distress HEENT: normal Neck: no JVD Vascular: Radial pulses 2+ bilaterally Cardiac:  normal S1, S2; RRR; no murmur  Lungs:  clear to auscultation bilaterally, no wheezing, rhonchi or rales. Normal WOB on room air  Abd: soft, nontender, no hepatomegaly  Ext: no edema Musculoskeletal:  No deformities, BUE and BLE strength normal and equal Skin: warm and dry  Neuro:  CNs 2-12 intact, no focal abnormalities noted Psych:  Normal affect   EKG:  The EKG was personally reviewed and demonstrates:  NSR with 2 PVCs present Telemetry:  Telemetry was personally reviewed and demonstrates:  NSR with occasional PVCs   Relevant CV Studies: Cardiac Studies & Procedures   ______________________________________________________________________________________________ CARDIAC CATHETERIZATION  CARDIAC CATHETERIZATION 10/02/2020  Narrative Images from the original result were not included. LM: Normal LAD: Diffuse luminal irregularities with prox 20%, mid 30% stenoses LCx: Prox 30%, OM1 30% stenoses RCA: Diffuse luminal irregularities with prox, mid, and RPDA 20% stenoses  LVEDP 10 mmHg  Mild CAD, normal LVEDP Symptoms of dyspnea and chest pain at rest out of proportion to angiography findings Consider alternate etiology   Elder Negus, MD Pager: [phone] Office: [phone]  Findings Coronary Findings Diagnostic  Dominance: Right  Left Anterior Descending Prox LAD lesion is 20% stenosed. Mid LAD lesion is 30% stenosed.  Left Circumflex Prox Cx lesion is 30% stenosed.  First Obtuse Marginal Branch 1st Mrg lesion is 30% stenosed.  Right Coronary Artery Prox RCA lesion is 20% stenosed. Prox RCA to Mid RCA lesion is 20% stenosed. Dist RCA lesion is 20% stenosed.  Right Posterior Descending  Artery RPDA lesion is 20% stenosed.  Intervention  No interventions have been documented.     ECHOCARDIOGRAM  PCV ECHOCARDIOGRAM COMPLETE 08/01/2022  Narrative Echocardiogram 08/01/2022: Normal LV systolic function with visual EF 65-70%. Left ventricle cavity is normal in size. Normal left ventricular wall thickness. Normal global wall motion. Normal diastolic filling pattern, normal LAP. No significant valvular heart disease. Compared to 09/30/2020 otherwise no significant change.    MONITORS  LONG TERM MONITOR (3-14 DAYS) 08/09/2022  Narrative Cardiac monitor (Zio Patch): 08/01/2022 -08/07/2022 Dominant rhythm sinus, followed by tachycardia (12% burden). Heart rate 57-126 bpm.  Avg HR 88 bpm. No atrial fibrillation, supraventricular tachycardia, ventricular tachycardia, high grade AV block, pauses (3 seconds or longer). Total ventricular ectopic burden 12.1% (predominantly as isolated beats). Total supraventricular ectopic burden <1%. Patient triggered events: 8.  Underlying rhythm sinus with PVCs either an isolated beats or in bigeminy/trigeminy pattern.   CT SCANS  CT CORONARY MORPH W/CTA COR W/SCORE 08/09/2020  Addendum 08/13/2020  8:50 PM ADDENDUM REPORT: 08/13/2020 20:48  HISTORY: Chest pain/anginal equiv, 80yr CHD risk < 10%, not treadmill candidate Dyspnea on exertion (DOE)  EXAM: Cardiac/Coronary  CT  TECHNIQUE: The patient was scanned on a [employer].  PROTOCOL: A 120 kV prospective scan was triggered in the descending thoracic aorta at 111 HU's. Axial non-contrast 3 mm slices were carried out through the heart. The data set was analyzed on a dedicated work station and scored using the Agatson method. Gantry rotation speed was 250 msecs and collimation was .6 mm. IV beta blockade and 0.8 mg of sl NTG was given. The 3D data set was reconstructed in 5% intervals of the 67-82 % of the R-R cycle. Diastolic phases were analyzed on a dedicated work  station using MPR, MIP and VRT modes. The patient received 80mL OMNIPAQUE IOHEXOL 350 MG/ML SOLN of contrast.  FINDINGS: Image quality: Average.  Noise artifact is: Limited.  Coronary artery calcification score:  Left main: 0  Left anterior descending artery:27  Left circumflex artery: 134  Right coronary artery: 373  Total calcium score: 534 AU  Coronary calcium score is 534, which places the patient in the 98th percentile for age and sex matched control.  Coronary arteries: Normal coronary origins.  Right dominance.  Left Main Coronary Artery: The left main is a large caliber vessel with a normal take off from the left coronary cusp that bifurcates to form a left anterior descending artery and a left circumflex artery. There is no plaque or stenosis.  Left Anterior Descending Coronary Artery: Normal caliber vessel that gives rise to 2 diagonal branches and reaches apex. Minimal stenosis (1-24%) in proximal LAD due to mixed plaque. Moderate stenosis (50-69%) in mid to distal LAD due to noncalcified plaque. Both diagonal branches are patent.  Left Circumflex Artery: Non-dominant vessel that travels within AV groove and gives rise to one obtuse marginal branch. LCx is patent without evidence of plaque or stenosis. Moderate stenosis (50-69%) due to mixed plaque within ostial OM1.  Right Coronary Artery: The RCA is dominant with normal take off from the right coronary cusp. The RCA terminates as a PDA and right posterolateral branch. Moderate stenosis (50-69%) due to noncalcified plaque and mild stenosis (25-49%) due to calcified plaque within proximal RCA. Moderate stenosis (50-69%) distal RCA due to mixed plaque.  Left Atrium: Left atrium is grossly normal in size with no left atrial appendage filling defect.  Left Ventricle: The ventricular cavity size is within normal limits. There are no stigmata of prior infarction. There is no abnormal filling  defect.  Pulmonary arteries: Normal in size without proximal filling defect.  Pulmonary veins: Normal  pulmonary venous drainage.  Aorta: Normal size, 32 mm at the mid ascending aorta (level of the PA bifurcation) measured double oblique. Aortic atherosclerosis. No dissection.  Pericardium: Normal thickness with no significant effusion or calcium present.  Cardiac valves: The aortic valve is trileaflet without calcification. The mitral valve is normal structure without calcification.  Extra-cardiac findings: See attached radiology report for non-cardiac structures.  IMPRESSION: 1. Coronary calcium score of 534. This was 98th percentile for age and sex matched control.  2. Normal coronary origin with right dominance.  3. CAD-RADS = 3. Moderate stenosis within mid to distal LAD, Moderate stenosis within proximal and distal RCA, Moderate stenosis within OM1 branch.  4.  Aortic atherosclerosis.  5. Study is sent for CT-FFR and findings will be performed and reported separately.  RECOMMENDATIONS:  Moderate stenosis. Consider symptom-guided anti-ischemic pharmacotherapy as well as risk factor modification per guideline directed care. Additional analysis with CT FFR will be submitted.   Electronically Signed By: Tessa Lerner On: 08/13/2020 20:48  Narrative EXAM: OVER-READ INTERPRETATION  CT CHEST  The following report is an over-read performed by radiologist Dr. Charlett Nose of [facility] on 08/09/2020. This over-read does not include interpretation of cardiac or coronary anatomy or pathology. The coronary CTA interpretation by the cardiologist is attached.  COMPARISON:  None.  FINDINGS: Vascular: Heart is normal size. Aorta normal caliber. Scattered calcifications in the aortic root.  Mediastinum/Nodes: No adenopathy  Lungs/Pleura: No confluent opacities or effusions.  Upper Abdomen: Diffuse hepatic steatosis.  Musculoskeletal: Chest wall soft  tissues are unremarkable. No acute bony abnormality.  IMPRESSION: Scattered aortic calcifications in the aortic root.  No acute extra cardiac abnormality.  Suspect hepatic steatosis.  Electronically Signed: By: Charlett Nose M.D. On: 08/09/2020 18:39     ______________________________________________________________________________________________       Laboratory Data:  High Sensitivity Troponin:   Recent Labs  Lab 10/17/23 0955  TROPONINIHS 3     Chemistry Recent Labs  Lab 10/17/23 0955  NA 139  K 4.1  CL 108  CO2 24  GLUCOSE 106*  BUN 20  CREATININE 1.01  CALCIUM 9.2  MG 2.2  GFRNONAA >60  ANIONGAP 7    Recent Labs  Lab 10/17/23 0955  PROT 7.0  ALBUMIN 4.3  AST 28  ALT 28  ALKPHOS 55  BILITOT 0.7   Lipids No results for input(s): "CHOL", "TRIG", "HDL", "LABVLDL", "LDLCALC", "CHOLHDL" in the last 168 hours.  Hematology Recent Labs  Lab 10/17/23 0955  WBC 7.2  RBC 5.04  HGB 15.6  HCT 45.1  MCV 89.5  MCH 31.0  MCHC 34.6  RDW 12.0  PLT 275   Thyroid No results for input(s): "TSH", "FREET4" in the last 168 hours.  BNPNo results for input(s): "BNP", "PROBNP" in the last 168 hours.  DDimer No results for input(s): "DDIMER" in the last 168 hours.   Radiology/Studies:  DG Chest 2 View Result Date: 10/17/2023 CLINICAL DATA:  Chest pain. EXAM: CHEST - 2 VIEW COMPARISON:  01/20/2023. FINDINGS: Bilateral lung fields are clear. Bilateral costophrenic angles are clear. Normal cardio-mediastinal silhouette. No acute osseous abnormalities. The soft tissues are within normal limits. IMPRESSION: No active cardiopulmonary disease. Electronically Signed   By: Jules Schick M.D.   On: 10/17/2023 10:59     Assessment and Plan:   Chest Pain  Nonobstructive CAD  - Patient previously underwent cardiac catheterization in 09/2020 that showed mild, nonobstructive CAD (at most, 30% stenosis present). More recently, cardiac stress test in 07/2022  was a low risk  study. Stress EKG showed no ischemic changes  - Patient was seen by Dr. Odis Hollingshead on 09/18/23 for evaluation of occasional chest pain. Scheduled for cardiac PET  - Today, patient presented to the ED with 2 weeks of near constant chest pain. Pain seems to worsen when he leans forward to pick something up. Not associated with other forms of exertion. No associated shortness of breath. Pain is located in one specific spot in his chest and is always located that spot  - hsTn negative x2 in the setting of near constant, ongoing chest pain. This is very reassuring that he does not have high risk coronary artery disease  - EKG today nonischemic  - Overall, patient has atypical chest pain and workup has been reassuring. No high-risk features present  - OK to proceed with outpatient cardiac PET as scheduled  - Continue ASA 81 mg daily  - Continue lipitor 40 mg daily and zetia 10 mg daily  - Continue metoprolol succinate 100 mg daily  - Continue imdur 15 mg daily  - Discharge from the ED with PRN nitroglycerin. MD to see   PVCs  - Previously had 12% PVC burden in 07/2022. More recent, cardiac monitor from 09/2022 showed PVC burden on 6% - Continue metoprolol succinate 100 mg daily  - Echocardiogram from 07/2022 showed EF 65-70%, normal wall motion, no significant valvular disease   HLD  - Continue lipitor 40 mg daily and zetia 10 mg daily  - Lipid panel from 08/2022 showed LDL 74  HTN  - Continue metoprolol, imdur as above     Risk Assessment/Risk Scores:    For questions or updates, please contact [facility] Please consult [email or web address] for contact info under    Signed, Jonita Albee, PA-C  10/17/2023 11:46 AM   I have personally seen and examined the patient.  My HPI, Exam, and assessment and plan are below, independent of the NPP above.  Ms. Calender is a 58 year old with nonobstructive coronary artery disease who presents with persistent chest pain.  He has been experiencing  near constant chest pain for the past two weeks, localized to one specific spot in his chest. The pain worsens when bending forward or with physical exertion, such as loading heavy bins. He describes the pain as similar to what he experienced in 2022, which was attributed to mild nonobstructive coronary artery disease after a heart catheterization. No exertional angina has been noted, but the pain can intensify with exertion.  He has a history of nonobstructive coronary artery disease, frequent PVCs, and hyperlipidemia. He is statin intolerant and has been managing his PVCs with beta blockers, which reduced his PVC burden from 12% to 6%. An echocardiogram in January 2024 showed an ejection fraction of 65-70%, and his troponin levels have been normal. He is currently in sinus rhythm and has a myocardial perfusion imaging test scheduled for Nov 26, 2023 (PET MPI)  He is concerned about his health due to his role as his wife's primary caregiver, as she has a tendency to have brain seizures. He is anxious about the possibility of a serious cardiac event, stating, 'I don't want to drop dead.'  Exam notable for  Gen: no distress  Neck: No JVD Ears: Bilateral Homero Fellers Sign Cardiac: No Rubs or Gallops, no murmur, RRR +2 Respiratory: Clear to auscultation bilaterally, normal effort, normal  respiratory rate GI: Soft, nontender, non-distended  MS: No  edema;  moves all extremities Integument: Skin feels  warm Neuro:  At time of evaluation, alert and oriented to person/place/time/situation  Psych: Normal affect, patient feels ok  Labs notable for normal hsTroponin EKG SR with occaisonal PVCs Tele: SR to sinus bradycardia with rare PVC  In assessment and plan:   Atypical Chest Pain Experiencing near constant chest pain for two weeks, atypical in nature, localized to one spot, worsens with bending forward, not characteristic of cardiac pain. Similar to 2022 pain attributed to mild nonobstructive coronary  artery disease. Troponin levels normal, indicating no myocardial infarction. Differential includes non-cardiac causes such as musculoskeletal pain, given association with physical exertion and bending. Presentation not consistent with high-risk coronary disease. - Continue outpatient follow-up with PET MPI study scheduled for Nov 26, 2023 we have asked to expedite it - Increase Isosorbide Mononitrate to 30 mg daily. - Prescribe nitroglycerin for use as needed for chest pain. - reviewed that his lack of NSTEMI is a positive prognostic factor.  We have seen no high risk features (NSTEMI, NSVT).  Discuss observation   Coronary Artery Disease (CAD) Mild nonobstructive coronary artery disease with previous catheterization in 2022 showing no need for stenting or bypass. Current symptoms atypical for high-risk coronary disease, recent troponin levels normal. Anxious about condition due to role as primary caregiver for wife. No evidence of myocardial infarction or high-risk features. Discussed options including medication management and potential procedures if symptoms worsen. - Reassure regarding the absence of high-risk features and normal heart function. - Discussed potential for expedited PET MPI study and alternative testing options if needed (CCTA)  We are actively discussing this with ED and our advanced imaging team.  Frequent Premature Ventricular Contractions (PVCs) - Frequent PVCs, improved with beta-blocker therapy. Current PVC burden approximately 6%, down from 12% after starting beta-blockers. PVCs isolated and polymorphic, not associated with sustained ventricular tachycardia. - continue current therapy; no evidence of coronary vasospasm.  Hyperlipidemia with Statin Intolerance - Hyperlipidemia relevant to cardiovascular risk profile, complicated by statin intolerance.   Riley Lam, MD FASE [facility] Cardiologist [facility]  [address] [place], [address] [place], [place]  [address] [phone]  1:32 PM

## 2023-10-17 NOTE — ED Notes (Signed)
 Called CCMD to add to monitor

## 2023-10-17 NOTE — ED Notes (Signed)
 Patient transported to X-ray

## 2023-10-17 NOTE — ED Notes (Signed)
 Called lab to inquire about troponin not running. Was collected at 64. Lab said they didn't have it, but to wait for 15 min to see if they find it

## 2023-10-17 NOTE — ED Provider Notes (Signed)
 [facility] [facility] AT [facility] Provider Note   CSN: [id number] Arrival date & time: 10/17/23  4098     History  Chief Complaint  Patient presents with   Chest Pain    Jovian Cavenaugh is a 58 y.o. male.  HPI 58 year old male presents with chest pain. Has had pain on and off for about 12 days. Frequently can be positional, such as bending over, but can also be with working. He gets lightheaded and has frequent palpitations as well. 2 days ago he was working on a tent and noticed his arms (forearms down) turned blue for a couple minutes. He also was diffusely pale afterwards and felt palpitations and lightheadedness. He has some mild chest pain currently. No leg swelling or abdominal pain.  Home Medications Prior to Admission medications   Medication Sig Start Date End Date Taking? Authorizing Provider  nitroGLYCERIN (NITROSTAT) 0.4 MG SL tablet Place 1 tablet (0.4 mg total) under the tongue every 5 (five) minutes as needed for chest pain. 10/17/23  Yes Pricilla Loveless, MD  aspirin EC 81 MG tablet Take 1 tablet (81 mg total) by mouth daily. Swallow whole. 08/22/20   Tolia, Sunit, DO  atorvastatin (LIPITOR) 40 MG tablet TAKE 1 TABLET BY MOUTH AT BEDTIME 08/29/23   Tolia, Sunit, DO  ezetimibe (ZETIA) 10 MG tablet Take 1 tablet by mouth once daily 09/01/23   Tolia, Sunit, DO  isosorbide mononitrate (IMDUR) 30 MG 24 hr tablet Take 1 tablet (30 mg total) by mouth daily. 10/17/23   Pricilla Loveless, MD  JARDIANCE 25 MG TABS tablet Take 25 mg by mouth daily. 08/01/23   [provider]  magnesium oxide (MAG-OX) 400 MG tablet Take 400 mg by mouth daily.    [provider]  metoprolol succinate (TOPROL-XL) 100 MG 24 hr tablet Take 1 tablet (100 mg total) by mouth daily. WITH OR IMMEDIATELY FOLLOWING A MEAL 10/06/23   Tolia, Sunit, DO  Multiple Vitamin (MULTIVITAMIN ADULT PO) Take 1 tablet by mouth daily.    [provider]  Omega-3 Fatty Acids (FISH OIL)  1000 MG CAPS Take 1 capsule by mouth daily.    [provider]      Allergies    Crestor [rosuvastatin calcium], Rosuvastatin, Latex, Topiramate, Varenicline, and Tape    Review of Systems   Review of Systems  Respiratory:  Positive for shortness of breath.   Cardiovascular:  Positive for chest pain and palpitations. Negative for leg swelling.  Gastrointestinal:  Negative for abdominal pain.  Neurological:  Positive for light-headedness.    Physical Exam Updated Vital Signs BP 125/77   Pulse (!) 55   Temp 98.2 F (36.8 C) (Oral)   Resp 18   Ht 6' (1.829 m)   Wt 86.2 kg   SpO2 98%   BMI 25.77 kg/m  Physical Exam Vitals and nursing note reviewed.  Constitutional:      General: He is not in acute distress.    Appearance: He is well-developed. He is not ill-appearing or diaphoretic.  HENT:     Head: Normocephalic and atraumatic.  Cardiovascular:     Rate and Rhythm: Normal rate and regular rhythm.     Pulses:          Radial pulses are 2+ on the right side and 2+ on the left side.     Heart sounds: Normal heart sounds.  Pulmonary:     Effort: Pulmonary effort is normal.     Breath sounds:  Normal breath sounds.  Chest:     Chest wall: No tenderness.  Abdominal:     Palpations: Abdomen is soft.     Tenderness: There is no abdominal tenderness.  Musculoskeletal:     Right lower leg: No edema.     Left lower leg: No edema.  Skin:    General: Skin is warm and dry.  Neurological:     Mental Status: He is alert.     ED Results / Procedures / Treatments   Labs (all labs ordered are listed, but only abnormal results are displayed) Labs Reviewed  COMPREHENSIVE METABOLIC PANEL WITH GFR - Abnormal; Notable for the following components:      Result Value   Glucose, Bld 106 (*)    All other components within normal limits  CBC WITH DIFFERENTIAL/PLATELET  MAGNESIUM  TROPONIN I (HIGH SENSITIVITY)  TROPONIN I (HIGH SENSITIVITY)    EKG EKG  Interpretation Date/Time:  Friday October 17 2023 09:50:10 EDT Ventricular Rate:  70 PR Interval:  163 QRS Duration:  101 QT Interval:  396 QTC Calculation: 428 R Axis:   75  Text Interpretation: Sinus rhythm Multiple ventricular premature complexes RSR' in V1 or V2, probably normal variant Confirmed by Pricilla Loveless [phone]) on 10/17/2023 10:03:25 AM  Radiology DG Chest 2 View Result Date: 10/17/2023 CLINICAL DATA:  Chest pain. EXAM: CHEST - 2 VIEW COMPARISON:  01/20/2023. FINDINGS: Bilateral lung fields are clear. Bilateral costophrenic angles are clear. Normal cardio-mediastinal silhouette. No acute osseous abnormalities. The soft tissues are within normal limits. IMPRESSION: No active cardiopulmonary disease. Electronically Signed   By: Jules Schick M.D.   On: 10/17/2023 10:59    Procedures Procedures    Medications Ordered in ED Medications  aspirin chewable tablet 324 mg (324 mg Oral Given 10/17/23 1005)    ED Course/ Medical Decision Making/ A&P                                 Medical Decision Making Amount and/or Complexity of Data Reviewed External Data Reviewed: notes. Labs: ordered.    Details: Normal troponin x 2 Radiology: ordered and independent interpretation performed.    Details: No CHF ECG/medicine tests: ordered and independent interpretation performed.    Details: No ischemia  Risk OTC drugs. Prescription drug management.   Presents with atypical chest pain.  Has been coming and going though seems more positional than anything else.  He did have this unclear episode where he reports that his arms were blue and he was very gray.  He has strong radial pulses bilaterally.  His chest pain is minimal at this time with a benign ECG and 2 negative troponins.  Cardiology was consulted and they have seen patient and we discussed the case.  They are comfortable with him being discharged with as needed nitroglycerin as well as increasing his Imdur and will help adjust  his outpatient testing.  Discussed that with a negative workup this does not rule out cardiac disease or cardiac cause of his symptoms and he should return if he develops new or worsening or concerning symptoms.  Doubt ACS, PE, dissection.        Final Clinical Impression(s) / ED Diagnoses Final diagnoses:  Nonspecific chest pain    Rx / DC Orders ED Discharge Orders          Ordered    isosorbide mononitrate (IMDUR) 30 MG 24 hr tablet  Daily  10/17/23 1406    nitroGLYCERIN (NITROSTAT) 0.4 MG SL tablet  Every 5 min PRN        10/17/23 1406    Ambulatory referral to Cardiology       Comments: If you have not heard from the Cardiology office within the next 72 hours please call [phone].   10/17/23 1406              Pricilla Loveless, MD 10/17/23 1411

## 2023-10-17 NOTE — ED Triage Notes (Signed)
 Pt here for substernal CP that started 2 weeks ago. Pt states bending over makes CP worse. C/O palpitations.

## 2023-10-17 NOTE — Discharge Instructions (Signed)
 The cardiology team will notify you of updated appointments and testing.  We are adjusting your medications and increasing your Imdur from half tablet to a full 30 mg tablet.  We are also giving you a medicine called nitroglycerin that you may use under the tongue if you develop chest pain.  However if you take 1 of these, it is important to seek medical care whether via EMS or coming into the hospital.  If you develop recurrent, continued, or worsening chest pain, shortness of breath, fever, vomiting, abdominal or back pain, or any other new/concerning symptoms then return to the ER for evaluation.

## 2023-10-19 DIAGNOSIS — I493 Ventricular premature depolarization: Secondary | ICD-10-CM

## 2023-11-10 ENCOUNTER — Telehealth (HOSPITAL_COMMUNITY): Payer: Self-pay | Admitting: *Deleted

## 2023-11-10 ENCOUNTER — Encounter (HOSPITAL_COMMUNITY): Payer: Self-pay

## 2023-11-10 NOTE — Telephone Encounter (Signed)
 Reaching out to patient to offer assistance regarding upcoming cardiac imaging study; pt verbalizes understanding of appt date/time, parking situation and where to check in, pre-test NPO status and medications ordered, and verified current allergies; name and call back number provided for further questions should they arise Johney Frame RN Navigator Cardiac Imaging [facility] [phone] office [phone] cell  Patient aware to avoid caffeine for 12 hours prior to test and hold imdur.

## 2023-11-11 ENCOUNTER — Encounter (HOSPITAL_COMMUNITY)
Admission: RE | Admit: 2023-11-11 | Discharge: 2023-11-11 | Disposition: A | Discharge status: Home / Self Care | Source: Ambulatory Visit | Attending: Cardiology | Admitting: Cardiology

## 2023-11-11 DIAGNOSIS — R072 Precordial pain: Secondary | ICD-10-CM | POA: Diagnosis present

## 2023-11-11 LAB — NM PET CT CARDIAC PERFUSION MULTI W/ABSOLUTE BLOODFLOW
MBFR: 1.47
Nuc Rest EF: 55 %
Nuc Stress EF: 50 %
Rest MBF: 0.9 ml/g/min
Rest Nuclear Isotope Dose: 21.4 mCi
ST Depression (mm): 0 mm
Stress MBF: 1.32 ml/g/min
Stress Nuclear Isotope Dose: 21.5 mCi
TID: 1.16

## 2023-11-11 MED ORDER — REGADENOSON 0.4 MG/5ML IV SOLN
INTRAVENOUS | Status: AC
  Filled 2023-11-11: qty 5

## 2023-11-11 MED ORDER — REGADENOSON 0.4 MG/5ML IV SOLN
0.4000 mg | Freq: Once | INTRAVENOUS | Status: AC
  Administered 2023-11-11: 0.4 mg via INTRAVENOUS

## 2023-11-11 MED ORDER — RUBIDIUM RB82 GENERATOR (RUBYFILL)
21.4200 | PACK | Freq: Once | INTRAVENOUS | Status: AC
  Administered 2023-11-11: 21.42 via INTRAVENOUS

## 2023-11-11 MED ORDER — RUBIDIUM RB82 GENERATOR (RUBYFILL)
21.0000 | PACK | Freq: Once | INTRAVENOUS | Status: AC
  Administered 2023-11-11: 21.47 via INTRAVENOUS

## 2023-11-11 NOTE — Progress Notes (Signed)
 Pt. Tolerated lexi scan well.

## 2023-11-18 NOTE — Progress Notes (Unsigned)
 Cardiology Office Note:    Date:  11/19/2023  ID:  Phillip Clark, DOB 02/02/1966, MRN [id number] PCP: Arlys Berke, MD  [facility] Providers Cardiologist:  Olinda Bertrand, DO       Patient Profile:      Coronary artery disease  CCTA 08/09/20: CAC score 534 (98th percentile); mod stenosis mid to dist LAD, mod stenosis in prox and dist RCA, mod stenosis OM1; aortic atherosclerosis; FFR normal  LHC 10/02/20: pLAD 20, mLAD 30, pLCx 30, OM1 30 ,RPDA 20 PET CT MPI 11/11/23: Large area of ischemia in apical to basal inf/inf-sept/inf-lat walls; global MBFR is reduced in each coronary distribution; +TID; EF 55>>50; high risk PVCs Improved on beta-blocker (burden ? from 12% to 6%) TTE 07/22/22: EF 65-70, no RWMA, no valvular heart disease  Monitor 09/2023: No AFib, high grade HB, pauses; PVCs 6.1% FHx CAD Hyperlipidemia  Statin myalgias Diabetes mellitus  Tobacco use         Discussed the use of AI scribe software for clinical note transcription with the patient, who gave verbal consent to proceed.  History of Present Illness Phillip Clark is a 58 y.o. male who returns for follow up of CAD, PVCs. He was seen by Dr. Albert Huff in 09/2023 for chest pain. Follow up monitor showed improved PVC burden to 6%. He was seen in the ED with chest pain. His hsT were neg x 2. He was DC home for OP follow up. PET MPI was obtained and demonstrated high risk findings with large areas of inferior ischemia, TID and reduced global MBFR.  He is here with his wife. He has been experiencing chest pain over the past year, describing it as a burning sensation and severe pain that sometimes feels like his chest is 'on fire.' The pain occurs both at rest and with exertion, such as loading a pickup truck or walking up stairs. It radiates from the back of his ear down his throat and is associated with shortness of breath. No nausea, sweating, or syncope during these episodes. He mentions that the pain has been improving  recently. He is a smoker but is attempting to quit. No recent fever, cough, vomiting, or diarrhea. He does not experience difficulty breathing when lying flat.     ROS-See HPI    Studies Reviewed:   EKG Interpretation Date/Time:  Wednesday Nov 19 2023 10:45:52 EDT Ventricular Rate:  63 PR Interval:  148 QRS Duration:  90 QT Interval:  398 QTC Calculation: 407 R Axis:   30  Text Interpretation: Normal sinus rhythm Possible Left atrial enlargement Confirmed by Marlyse Single [phone]) on 11/19/2023 5:44:12 PM    Results    Risk Assessment/Calculations:             Physical Exam:   VS:  BP 110/60   Pulse 64   Ht 6' (1.829 m)   Wt 189 lb 6.4 oz (85.9 kg)   SpO2 98%   BMI 25.69 kg/m    Wt Readings from Last 3 Encounters:  11/19/23 189 lb 6.4 oz (85.9 kg)  10/17/23 190 lb (86.2 kg)  09/18/23 200 lb (90.7 kg)    Constitutional:      Appearance: Healthy appearance. Not in distress.  Neck:     Vascular: JVD normal.  Pulmonary:     Breath sounds: Normal breath sounds. No wheezing. No rales.  Cardiovascular:     Normal rate. Regular rhythm.     Murmurs: There is no murmur.  Edema:  Peripheral edema absent.  Abdominal:     Palpations: Abdomen is soft.        Assessment and Plan:   Assessment & Plan Coronary artery disease involving native coronary artery of native heart with angina pectoris [facility]) He has had fairly classic exertional anginal symptoms over the past year.  He describes CCS class II-III symptoms.  Recent PET myocardial perfusion imaging revealed a large ischemic area in the apical to basal inferior, infraseptal, and lateral walls, with reduced global myocardial blood flow reserve and transient ischemic dilatation, indicating high risk.  He is already on 3 antianginals with isosorbide , metoprolol  succinate, and as needed nitroglycerin .  Cardiac catheterization is recommended.   I reviewed with Dr. Daneil Dunker (attending MD) who agreed. - Schedule cardiac  catheterization  - Continue aspirin  81 mg daily. - Continue Lipitor 40 mg daily. - Continue Imdur  30 mg daily. - Continue Toprol  XL 100 mg daily. - Use nitroglycerin  as needed for angina. Pure hypercholesterolemia Statin-induced myalgias previously noted. Currently tolerating Lipitor and Zetia .  Target LDL <70 mg/dL. - Arrange follow-up lipid panel at next visit. - Continue Lipitor 40 mg daily. - Continue Zetia  10 mg daily. Cigarette smoker He is attempting to quit. Premature ventricular contractions Recent monitor demonstrated improved PVCs on beta-blocker therapy.  His blood pressure may tolerate further increases.  However, I decided to hold off on adjusting medications further given the need to proceed with cardiac catheterization.  Continue metoprolol  succinate 100 mg daily.      Informed Consent   Shared Decision Making/Informed Consent The risks [stroke (1 in 1000), death (1 in 1000), kidney failure [usually temporary] (1 in 500), bleeding (1 in 200), allergic reaction [possibly serious] (1 in 200)], benefits (diagnostic support and management of coronary artery disease) and alternatives of a cardiac catheterization were discussed in detail with [name] and he is willing to proceed.      Dispo:  Return for Post Procedure Follow Up.  Signed, Marlyse Single, PA-C

## 2023-11-19 ENCOUNTER — Ambulatory Visit: Attending: Physician Assistant | Admitting: Physician Assistant

## 2023-11-19 ENCOUNTER — Encounter: Payer: Self-pay | Admitting: Physician Assistant

## 2023-11-19 VITALS — BP 110/60 | HR 64 | Ht 72.0 in | Wt 189.4 lb

## 2023-11-19 DIAGNOSIS — E7800 Pure hypercholesterolemia, unspecified: Secondary | ICD-10-CM | POA: Diagnosis not present

## 2023-11-19 DIAGNOSIS — I493 Ventricular premature depolarization: Secondary | ICD-10-CM

## 2023-11-19 DIAGNOSIS — F17210 Nicotine dependence, cigarettes, uncomplicated: Secondary | ICD-10-CM | POA: Diagnosis not present

## 2023-11-19 DIAGNOSIS — I25119 Atherosclerotic heart disease of native coronary artery with unspecified angina pectoris: Secondary | ICD-10-CM | POA: Diagnosis not present

## 2023-11-19 NOTE — Patient Instructions (Signed)
 Medication Instructions:  Your physician recommends that you continue on your current medications as directed. Please refer to the Current Medication list given to you today.  *If you need a refill on your cardiac medications before your next appointment, please call your pharmacy*  Lab Work: TODAY:  CMET & CBC  COME FASTING TO YOUR NEXT APPOINTMENT SO WE CAN CHECK YOUR CHOLESTEROL AT THAT TIME.  If you have labs (blood work) drawn today and your tests are completely normal, you will receive your results only by: MyChart Message (if you have MyChart) OR A paper copy in the mail If you have any lab test that is abnormal or we need to change your treatment, we will call you to review the results.  Testing/Procedures: Your physician has requested that you have a cardiac catheterization. Cardiac catheterization is used to diagnose and/or treat various heart conditions. Doctors may recommend this procedure for a number of different reasons. The most common reason is to evaluate chest pain. Chest pain can be a symptom of coronary artery disease (CAD), and cardiac catheterization can show whether plaque is narrowing or blocking your heart's arteries. This procedure is also used to evaluate the valves, as well as measure the blood flow and oxygen levels in different parts of your heart. For further information please visit [email or web address] Please follow instruction sheet, BELOW:        Cardiac/Peripheral Catheterization   You are scheduled for a Cardiac Catheterization on Wednesday, May 14 with Dr.  Filiberto Hug .  1. Please arrive at the [facility] (Main Entrance A) at [facility]: [address] [place], [place] [address] at 8:00 AM (This time is 2 hour(s) before your procedure to ensure your preparation).   Free valet parking service is available. You will check in at ADMITTING. The support person will be asked to wait in the waiting room.  It is OK to have someone drop you off and  come back when you are ready to be discharged.        Special note: Every effort is made to have your procedure done on time. Please understand that emergencies sometimes delay scheduled procedures.  2. Diet: Do not eat solid foods after midnight.  You may have clear liquids until 5 AM the day of the procedure.  3. Labs: You will need to have blood drawn on TODAY   4. Medication instructions in preparation for your procedure:   Contrast Allergy: No  On the morning of your procedure, take Aspirin  81 mg and any morning medicines NOT listed above.  You may use sips of water.  5. Plan to go home the same day, you will only stay overnight if medically necessary. 6. You MUST have a responsible adult to drive you home. 7. An adult MUST be with you the first 24 hours after you arrive home. 8. Bring a current list of your medications, and the last time and date medication taken. 9. Bring ID and current insurance cards. 10.Please wear clothes that are easy to get on and off and wear slip-on shoes.  Thank you for allowing us  to care for you!   -- [facility]  Follow-Up: At [facility], you and your health needs are our priority.  As part of our continuing mission to provide you with exceptional heart care, our providers are all part of one team.  This team includes your primary Cardiologist (physician) and Advanced Practice Providers or APPs (Physician Assistants and Nurse Practitioners)  who all work together to provide you with the care you need, when you need it.  Your next appointment:   2 week(s)  Provider:   Olinda Bertrand, DO or Marlyse Single, PA-C          We recommend signing up for the patient portal called "MyChart".  Sign up information is provided on this After Visit Summary.  MyChart is used to connect with patients for Virtual Visits (Telemedicine).  Patients are able to view lab/test results, encounter notes, upcoming appointments, etc.   Non-urgent messages can be sent to your provider as well.   To learn more about what you can do with MyChart, go to [email or web address].   Other Instructions

## 2023-11-19 NOTE — Assessment & Plan Note (Signed)
 Recent monitor demonstrated improved PVCs on beta-blocker therapy.  His blood pressure may tolerate further increases.  However, I decided to hold off on adjusting medications further given the need to proceed with cardiac catheterization.  Continue metoprolol  succinate 100 mg daily.

## 2023-11-19 NOTE — Assessment & Plan Note (Signed)
 Statin-induced myalgias previously noted. Currently tolerating Lipitor and Zetia .  Target LDL <70 mg/dL. - Arrange follow-up lipid panel at next visit. - Continue Lipitor 40 mg daily. - Continue Zetia  10 mg daily.

## 2023-11-19 NOTE — Assessment & Plan Note (Addendum)
 He has had fairly classic exertional anginal symptoms over the past year.  He describes CCS class II-III symptoms.  Recent PET myocardial perfusion imaging revealed a large ischemic area in the apical to basal inferior, infraseptal, and lateral walls, with reduced global myocardial blood flow reserve and transient ischemic dilatation, indicating high risk.  He is already on 3 antianginals with isosorbide , metoprolol  succinate, and as needed nitroglycerin .  Cardiac catheterization is recommended.   I reviewed with Dr. Daneil Dunker (attending MD) who agreed. - Schedule cardiac catheterization  - Continue aspirin  81 mg daily. - Continue Lipitor 40 mg daily. - Continue Imdur  30 mg daily. - Continue Toprol  XL 100 mg daily. - Use nitroglycerin  as needed for angina.

## 2023-11-20 ENCOUNTER — Encounter: Payer: Self-pay | Admitting: Cardiology

## 2023-11-20 LAB — COMPREHENSIVE METABOLIC PANEL WITH GFR
ALT: 24 IU/L (ref 0–44)
AST: 30 IU/L (ref 0–40)
Albumin: 4.5 g/dL (ref 3.8–4.9)
Alkaline Phosphatase: 75 IU/L (ref 44–121)
BUN/Creatinine Ratio: 32 — ABNORMAL HIGH (ref 9–20)
BUN: 31 mg/dL — ABNORMAL HIGH (ref 6–24)
Bilirubin Total: 0.4 mg/dL (ref 0.0–1.2)
CO2: 22 mmol/L (ref 20–29)
Calcium: 9.1 mg/dL (ref 8.7–10.2)
Chloride: 103 mmol/L (ref 96–106)
Creatinine, Ser: 0.97 mg/dL (ref 0.76–1.27)
Globulin, Total: 1.8 g/dL (ref 1.5–4.5)
Glucose: 114 mg/dL — ABNORMAL HIGH (ref 70–99)
Potassium: 4.9 mmol/L (ref 3.5–5.2)
Sodium: 137 mmol/L (ref 134–144)
Total Protein: 6.3 g/dL (ref 6.0–8.5)
eGFR: 92 mL/min/{1.73_m2} (ref >59)

## 2023-11-20 LAB — CBC
Hematocrit: 44.7 % (ref 37.5–51.0)
Hemoglobin: 14.8 g/dL (ref 13.0–17.7)
MCH: 30.7 pg (ref 26.6–33.0)
MCHC: 33.1 g/dL (ref 31.5–35.7)
MCV: 93 fL (ref 79–97)
Platelets: 266 10*3/uL (ref 150–450)
RBC: 4.82 x10E6/uL (ref 4.14–5.80)
RDW: 11.9 % (ref 11.6–15.4)
WBC: 6.2 10*3/uL (ref 3.4–10.8)

## 2023-11-25 ENCOUNTER — Telehealth: Payer: Self-pay | Admitting: *Deleted

## 2023-11-25 ENCOUNTER — Ambulatory Visit (HOSPITAL_BASED_OUTPATIENT_CLINIC_OR_DEPARTMENT_OTHER): Payer: Self-pay | Admitting: *Deleted

## 2023-11-25 NOTE — Telephone Encounter (Signed)
 Cardiac Catheterization scheduled at [facility] for: Wednesday Nov 26, 2023 10 AM Arrival time [facility] Main Entrance A at: 8 AM  Nothing to eat after midnight prior to procedure, clear liquids until 5 AM day of procedure.  Medication instructions: -Hold:  Jardiance-AM of procedure  -Other usual morning medications can be taken with sips of water including aspirin  81 mg.  Plan to go home the same day, you will only stay overnight if medically necessary.  You must have responsible adult to drive you home.  Someone must be with you the first 24 hours after you arrive home.  Reviewed procedure instructions with patient.

## 2023-11-26 ENCOUNTER — Other Ambulatory Visit (HOSPITAL_COMMUNITY): Payer: Self-pay

## 2023-11-26 ENCOUNTER — Other Ambulatory Visit (HOSPITAL_COMMUNITY)

## 2023-11-26 ENCOUNTER — Other Ambulatory Visit: Payer: Self-pay | Admitting: Cardiology

## 2023-11-26 ENCOUNTER — Ambulatory Visit (HOSPITAL_COMMUNITY)
Admission: RE | Admit: 2023-11-26 | Discharge: 2023-11-26 | Disposition: A | Discharge status: Home / Self Care | Attending: Cardiology | Admitting: Cardiology

## 2023-11-26 ENCOUNTER — Telehealth: Payer: Self-pay

## 2023-11-26 ENCOUNTER — Encounter (HOSPITAL_COMMUNITY): Payer: Self-pay | Admitting: Cardiology

## 2023-11-26 ENCOUNTER — Encounter (HOSPITAL_COMMUNITY)
Admission: RE | Disposition: A | Discharge status: Home / Self Care | Payer: Self-pay | Source: Home / Self Care | Attending: Cardiology

## 2023-11-26 ENCOUNTER — Other Ambulatory Visit: Payer: Self-pay

## 2023-11-26 DIAGNOSIS — Z8249 Family history of ischemic heart disease and other diseases of the circulatory system: Secondary | ICD-10-CM | POA: Diagnosis not present

## 2023-11-26 DIAGNOSIS — I25118 Atherosclerotic heart disease of native coronary artery with other forms of angina pectoris: Secondary | ICD-10-CM | POA: Insufficient documentation

## 2023-11-26 DIAGNOSIS — E119 Type 2 diabetes mellitus without complications: Secondary | ICD-10-CM | POA: Diagnosis not present

## 2023-11-26 DIAGNOSIS — I25119 Atherosclerotic heart disease of native coronary artery with unspecified angina pectoris: Secondary | ICD-10-CM

## 2023-11-26 DIAGNOSIS — E785 Hyperlipidemia, unspecified: Secondary | ICD-10-CM | POA: Diagnosis not present

## 2023-11-26 DIAGNOSIS — F17200 Nicotine dependence, unspecified, uncomplicated: Secondary | ICD-10-CM | POA: Diagnosis not present

## 2023-11-26 DIAGNOSIS — I493 Ventricular premature depolarization: Secondary | ICD-10-CM | POA: Insufficient documentation

## 2023-11-26 HISTORY — PX: LEFT HEART CATH AND CORONARY ANGIOGRAPHY: CATH118249

## 2023-11-26 LAB — GLUCOSE, CAPILLARY
Glucose-Capillary: 83 mg/dL (ref 70–99)
Glucose-Capillary: 72 mg/dL (ref 70–99)

## 2023-11-26 SURGERY — LEFT HEART CATH AND CORONARY ANGIOGRAPHY
Anesthesia: LOCAL

## 2023-11-26 MED ORDER — MIDAZOLAM HCL 2 MG/2ML IJ SOLN
INTRAMUSCULAR | Status: DC | PRN
  Administered 2023-11-26: 1 mg via INTRAVENOUS

## 2023-11-26 MED ORDER — FENTANYL CITRATE (PF) 100 MCG/2ML IJ SOLN
INTRAMUSCULAR | Status: AC
  Filled 2023-11-26: qty 2

## 2023-11-26 MED ORDER — SODIUM CHLORIDE 0.9 % IV SOLN: INTRAVENOUS | Status: DC

## 2023-11-26 MED ORDER — LIDOCAINE HCL (PF) 1 % IJ SOLN
INTRAMUSCULAR | Status: AC
  Filled 2023-11-26: qty 30

## 2023-11-26 MED ORDER — SODIUM CHLORIDE 0.9 % IV SOLN: 250.0000 mL | INTRAVENOUS | Status: DC | PRN

## 2023-11-26 MED ORDER — MIDAZOLAM HCL 2 MG/2ML IJ SOLN
INTRAMUSCULAR | Status: AC
  Filled 2023-11-26: qty 2

## 2023-11-26 MED ORDER — HEPARIN SODIUM (PORCINE) 1000 UNIT/ML IJ SOLN
INTRAMUSCULAR | Status: AC
  Filled 2023-11-26: qty 10

## 2023-11-26 MED ORDER — ACETAMINOPHEN 325 MG PO TABS: 650.0000 mg | ORAL_TABLET | ORAL | Status: DC | PRN

## 2023-11-26 MED ORDER — IOHEXOL 350 MG/ML SOLN
INTRAVENOUS | Status: DC | PRN
  Administered 2023-11-26: 35 mL

## 2023-11-26 MED ORDER — FENTANYL CITRATE (PF) 100 MCG/2ML IJ SOLN
INTRAMUSCULAR | Status: DC | PRN
  Administered 2023-11-26: 25 ug via INTRAVENOUS

## 2023-11-26 MED ORDER — VERAPAMIL HCL 2.5 MG/ML IV SOLN
INTRAVENOUS | Status: AC
  Filled 2023-11-26: qty 2

## 2023-11-26 MED ORDER — VERAPAMIL HCL 2.5 MG/ML IV SOLN
INTRAVENOUS | Status: DC | PRN
  Administered 2023-11-26: 10 mL via INTRA_ARTERIAL

## 2023-11-26 MED ORDER — LABETALOL HCL 5 MG/ML IV SOLN: 10.0000 mg | INTRAVENOUS | Status: DC | PRN

## 2023-11-26 MED ORDER — ISOSORBIDE MONONITRATE ER 30 MG PO TB24
30.0000 mg | ORAL_TABLET | Freq: Every day | ORAL | 2 refills | Status: DC
Start: 2023-11-26 — End: 2024-01-02
  Filled 2023-11-26: qty 30, 30d supply, fill #0

## 2023-11-26 MED ORDER — HEPARIN (PORCINE) IN NACL 2000-0.9 UNIT/L-% IV SOLN
INTRAVENOUS | Status: DC | PRN
  Administered 2023-11-26: 1000 mL

## 2023-11-26 MED ORDER — SODIUM CHLORIDE 0.9% FLUSH: 3.0000 mL | INTRAVENOUS | Status: DC | PRN

## 2023-11-26 MED ORDER — SODIUM CHLORIDE 0.9% FLUSH: 3.0000 mL | Freq: Two times a day (BID) | INTRAVENOUS | Status: DC

## 2023-11-26 MED ORDER — LIDOCAINE HCL (PF) 1 % IJ SOLN
INTRAMUSCULAR | Status: DC | PRN
  Administered 2023-11-26: 5 mL via INTRADERMAL

## 2023-11-26 MED ORDER — ASPIRIN 81 MG PO CHEW
81.0000 mg | CHEWABLE_TABLET | ORAL | Status: DC
Start: 2023-11-26 — End: 2023-11-26

## 2023-11-26 MED ORDER — HEPARIN SODIUM (PORCINE) 1000 UNIT/ML IJ SOLN
INTRAMUSCULAR | Status: DC | PRN
  Administered 2023-11-26: 4500 [IU] via INTRAVENOUS

## 2023-11-26 MED ORDER — ONDANSETRON HCL 4 MG/2ML IJ SOLN: 4.0000 mg | Freq: Four times a day (QID) | INTRAMUSCULAR | Status: DC | PRN

## 2023-11-26 SURGICAL SUPPLY — 8 items
CATH INFINITI 5 FR JL3.5 (CATHETERS) IMPLANT
CATH INFINITI 5FR JL4 (CATHETERS) IMPLANT
CATH INFINITI JR4 5F (CATHETERS) IMPLANT
DEVICE RAD COMP TR BAND LRG (VASCULAR PRODUCTS) IMPLANT
GLIDESHEATH SLEND A-KIT 6F 22G (SHEATH) IMPLANT
GUIDEWIRE INQWIRE 1.5J.035X260 (WIRE) IMPLANT
PACK CARDIAC CATHETERIZATION (CUSTOM PROCEDURE TRAY) ×1 IMPLANT
SET ATX-X65L (MISCELLANEOUS) IMPLANT

## 2023-11-26 NOTE — H&P (Signed)
 OV 11/19/2023 copied for documentation      Cardiology Office Note:    Date:  11/26/2023  ID:  Phillip Clark, DOB 07/01/1966, MRN [id number] PCP: Arlys Berke, MD  [facility] Providers Cardiologist:  Olinda Bertrand, DO       Patient Profile:      Coronary artery disease  CCTA 08/09/20: CAC score 534 (98th percentile); mod stenosis mid to dist LAD, mod stenosis in prox and dist RCA, mod stenosis OM1; aortic atherosclerosis; FFR normal  LHC 10/02/20: pLAD 20, mLAD 30, pLCx 30, OM1 30 ,RPDA 20 PET CT MPI 11/11/23: Large area of ischemia in apical to basal inf/inf-sept/inf-lat walls; global MBFR is reduced in each coronary distribution; +TID; EF 55>>50; high risk PVCs Improved on beta-blocker (burden ? from 12% to 6%) TTE 07/22/22: EF 65-70, no RWMA, no valvular heart disease  Monitor 09/2023: No AFib, high grade HB, pauses; PVCs 6.1% FHx CAD Hyperlipidemia  Statin myalgias Diabetes mellitus  Tobacco use         Discussed the use of AI scribe software for clinical note transcription with the patient, who gave verbal consent to proceed.  History of Present Illness Phillip Clark is a 58 y.o. male who returns for follow up of CAD, PVCs. He was seen by Dr. Albert Huff in 09/2023 for chest pain. Follow up monitor showed improved PVC burden to 6%. He was seen in the ED with chest pain. His hsT were neg x 2. He was DC home for OP follow up. PET MPI was obtained and demonstrated high risk findings with large areas of inferior ischemia, TID and reduced global MBFR.  He is here with his wife. He has been experiencing chest pain over the past year, describing it as a burning sensation and severe pain that sometimes feels like his chest is 'on fire.' The pain occurs both at rest and with exertion, such as loading a pickup truck or walking up stairs. It radiates from the back of his ear down his throat and is associated with shortness of breath. No nausea, sweating, or syncope during these episodes. He  mentions that the pain has been improving recently. He is a smoker but is attempting to quit. No recent fever, cough, vomiting, or diarrhea. He does not experience difficulty breathing when lying flat.     ROS-See HPI    Studies Reviewed:        Results    Risk Assessment/Calculations:             Physical Exam:   VS:  BP 112/71   Pulse 63   Temp 97.8 F (36.6 C) (Oral)   Resp 16   Ht 6' (1.829 m)   Wt 84.8 kg   SpO2 97%   BMI 25.36 kg/m    Wt Readings from Last 3 Encounters:  11/26/23 84.8 kg  11/19/23 85.9 kg  10/17/23 86.2 kg    Constitutional:      Appearance: Healthy appearance. Not in distress.  Neck:     Vascular: JVD normal.  Pulmonary:     Breath sounds: Normal breath sounds. No wheezing. No rales.  Cardiovascular:     Normal rate. Regular rhythm.     Murmurs: There is no murmur.  Edema:    Peripheral edema absent.  Abdominal:     Palpations: Abdomen is soft.        Assessment and Plan:   Assessment & Plan       Informed Consent   Shared Decision Making/Informed  Consent The risks [stroke (1 in 1000), death (1 in 1000), kidney failure [usually temporary] (1 in 500), bleeding (1 in 200), allergic reaction [possibly serious] (1 in 200)], benefits (diagnostic support and management of coronary artery disease) and alternatives of a cardiac catheterization were discussed in detail with [name] and he is willing to proceed.      Dispo:  No follow-ups on file.  Signed, Cody Das, MD

## 2023-11-26 NOTE — Interval H&P Note (Signed)
 History and Physical Interval Note:  11/26/2023 12:27 PM  Phillip Clark  has presented today for surgery, with the diagnosis of Abnormal Pet scan.  The various methods of treatment have been discussed with the patient and family. After consideration of risks, benefits and other options for treatment, the patient has consented to  Procedure(s): LEFT HEART CATH AND CORONARY ANGIOGRAPHY (N/A) as a surgical intervention.  The patient's history has been reviewed, patient examined, no change in status, stable for surgery.  I have reviewed the patient's chart and labs.  Questions were answered to the patient's satisfaction.     [name] [name]

## 2023-11-26 NOTE — Telephone Encounter (Signed)
 Spoke to patient appointment scheduled with Dr.Jordan 5/22 at 10:20 am to discuss CTO.

## 2023-12-01 NOTE — H&P (View-Only) (Signed)
 Cardiology Office Note:    Date:  12/04/2023   ID:  Phillip Clark, DOB 1966/06/05, MRN [id number]  PCP:  Arlys Berke, MD   [facility] Providers Cardiologist:  Olinda Bertrand, DO     Referring MD: Arlys Berke, MD   Chief Complaint  Patient presents with   Coronary Artery Disease   Chest Pain    History of Present Illness:    Brahim Dolman is a 58 y.o. male is seen at the request of Dr Filiberto Hug for evaluation for potential CTO PCI. He has a history of HTN an HLD. Also history of tobacco abuse. Underwent cardiac cath in 2022 showing nonobstructive CAD. More recently developed typical exertional angina. Medical therapy titrated but symptoms persisted. PET myocardial perfusion imaging revealed a large ischemic area in the apical to basal inferior, infraseptal, and lateral walls, with reduced global myocardial blood flow reserve and transient ischemic dilatation, indicating high risk. He underwent repeat cardiac cath showing occlusion of the mid RCA with left to right collaterals to the PDA and PL branches.  The patient reports to me that he has been having symptoms of chest pain for the past 13 months. He has chest pain worse with exertion. Also complains of palpitations, dizziness, lightheadedness. Symptoms have persisted despite antianginal therapy. He is a smoker and plans to quit. 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 He reports 3 ED visits in the last year due to chest pain.  Past Medical History:  Diagnosis Date   Coronary artery calcification of native artery    Essential tremor    Hyperlipidemia    Hypertension    Nonobstructive atherosclerosis of coronary artery     Past Surgical History:  Procedure Laterality Date   HERNIA REPAIR     LEFT HEART CATH AND CORONARY ANGIOGRAPHY N/A 10/02/2020   Procedure: LEFT HEART CATH AND CORONARY ANGIOGRAPHY;  Surgeon: Cody Das, MD;   Location: [facility];  Service: Cardiovascular;  Laterality: N/A;   LEFT HEART CATH AND CORONARY ANGIOGRAPHY N/A 11/26/2023   Procedure: LEFT HEART CATH AND CORONARY ANGIOGRAPHY;  Surgeon: Cody Das, MD;  Location: [facility];  Service: Cardiovascular;  Laterality: N/A;   VASECTOMY      Current Medications: Current Meds  Medication Sig   aspirin  EC 81 MG tablet Take 1 tablet (81 mg total) by mouth daily. Swallow whole.   atorvastatin  (LIPITOR) 40 MG tablet TAKE 1 TABLET BY MOUTH AT BEDTIME   ezetimibe  (ZETIA ) 10 MG tablet Take 1 tablet by mouth once daily   isosorbide  mononitrate (IMDUR ) 30 MG 24 hr tablet Take 1 tablet (30 mg total) by mouth daily.   isosorbide  mononitrate (IMDUR ) 30 MG 24 hr tablet Take 1 tablet (30 mg total) by mouth daily.   JARDIANCE 25 MG TABS tablet Take 25 mg by mouth daily.   magnesium oxide (MAG-OX) 400 MG tablet Take 400 mg by mouth daily.   metoprolol  succinate (TOPROL -XL) 100 MG 24 hr tablet Take 1 tablet (100 mg total) by mouth daily. WITH OR IMMEDIATELY FOLLOWING A MEAL   Multiple Vitamin (MULTIVITAMIN ADULT PO) Take 1 tablet by mouth daily.   nitroGLYCERIN  (NITROSTAT ) 0.4 MG SL tablet Place 1 tablet (0.4 mg total) under the tongue every 5 (five) minutes as needed for chest pain.   Omega-3 Fatty Acids (FISH OIL) 1000 MG CAPS Take 1 capsule by mouth daily.  Allergies:   Crestor [rosuvastatin calcium ], Rosuvastatin, Latex, Topiramate, Varenicline, and Tape   Social History   Socioeconomic History   Marital status: Married    Spouse name: Not on file   Number of children: 0   Years of education: Not on file   Highest education level: Not on file  Occupational History   Not on file  Tobacco Use   Smoking status: Every Day    Current packs/day: 0.00    Average packs/day: 0.5 packs/day for 34.0 years (17.0 ttl pk-yrs)    Types: Cigarettes    Start date: 29    Last attempt to quit: 2018    Years since quitting: 7.3    Smokeless tobacco: Never  Vaping Use   Vaping status: Never Used  Substance and Sexual Activity   Alcohol use: Not Currently   Drug use: Never   Sexual activity: Yes  Other Topics Concern   Not on file  Social History Narrative   Not on file   Social Drivers of Health   Financial Resource Strain: Not on file  Food Insecurity: Not on file  Transportation Needs: Not on file  Physical Activity: Not on file  Stress: Not on file  Social Connections: Unknown (11/23/2021)   Received from [facility], [facility]   Social Network    Social Network: Not on file     Family History: The patient's family history includes Diabetes in his mother; Heart attack in his father; Hyperlipidemia in his brother, father, mother, sister, sister, sister, and sister; Hypertension in his brother, father, mother, and sister; Thyroid disease in his mother.  ROS:   Please see the history of present illness.     All other systems reviewed and are negative.  EKGs/Labs/Other Studies Reviewed:    The following studies were reviewed today: Echocardiogram 08/01/2022:  Normal LV systolic function with visual EF 65-70%. Left ventricle cavity  is normal in size. Normal left ventricular wall thickness. Normal global  wall motion. Normal diastolic filling pattern, normal LAP.  No significant valvular heart disease.  Compared to 09/30/2020 otherwise no significant chang   Event monitor 10/02/23: Study Highlights  Cardiac monitor (Zio Patch): September 21, 2023-September 25, 2023 Dominant rhythm sinus. Heart rate 48-116 bpm.  Avg HR 78 bpm. No atrial fibrillation detected during the monitoring period. No supraventricular tachycardia, ventricular tachycardia, high grade AV block, pauses (3 seconds or longer). Total supraventricular ectopic burden <1%. Total ventricular ectopic burden 6.1% (predominantly isolated beats). Patient triggered events: 9.  These episodes illustrate underlying rhythm to be sinus with  ventricular ectopy (majority of the time).  PET CT 11/11/23: Study Result  Narrative & Impression      Large reversible perfusion defect in apical to basal inferior/inferoseptal/inferolateral walls, consistent with ischemia.  Myocardial blood flow reserve is reduced globally and in each coronary distribution.  In addition, high risk findings are present, including TID (1.16) and drop in EF with stress, concerning for severe multivessel disease.  Overall, study is high risk and cardiac catheterization is recommended   LV perfusion is abnormal. There is evidence of ischemia. Defect 1: There is a large defect with moderate reduction in uptake present in the apical to basal inferior, inferolateral and inferoseptal location(s) that is reversible. There is normal wall motion in the defect area. Consistent with ischemia.   Rest left ventricular function is normal. Rest EF: 55%. Stress left ventricular function is normal. Stress EF: 50%. End diastolic cavity size is normal. End systolic cavity size is  normal.   Myocardial blood flow was computed to be 0.32ml/g/min at rest and 1.30ml/g/min at stress. Global myocardial blood flow reserve was 1.47 and was abnormal.   Coronary calcium  was present on the attenuation correction CT images. Moderate coronary calcifications were present. Coronary calcifications were present in the left anterior descending artery, left circumflex artery and right coronary artery distribution(s).   Findings are consistent with ischemia. The study is high risk.   Electronically signed by Carson Clara, MD   Cardiac cath 11/26/23:  LEFT HEART CATH AND CORONARY ANGIOGRAPHY   Conclusion  Coronary angiography 09/26/2023: LM: No significant disease LAD: Prox-mid 20-30% disease Lcx: Prox Lcx, OM1 30% disease RCA: Mid CTO after RV marginal branch           Minimal right-to-right, robust left-to--right collaterals from LAD, both septal and epicardial   LVEDP 22 mmHg       Recommend uptitration of ant anginal therapy and refer to Dr. [place] for CTO revascularization.   Cody Das, MD      Recent Labs: 10/17/2023: Magnesium 2.2 11/19/2023: ALT 24; BUN 31; Creatinine, Ser 0.97; Hemoglobin 14.8; Platelets 266; Potassium 4.9; Sodium 137  Recent Lipid Panel    Component Value Date/Time   CHOL 131 09/05/2022 1244   TRIG 108 09/05/2022 1244   HDL 37 (L) 09/05/2022 1244   CHOLHDL 5.0 10/02/2020 0235   VLDL 17 10/02/2020 0235   LDLCALC 74 09/05/2022 1244   LDLDIRECT 72 09/05/2022 1244     Risk Assessment/Calculations:                Physical Exam:    VS:  BP 114/72 (BP Location: Left Arm, Patient Position: Sitting, Cuff Size: Normal)   Pulse 69   Ht 6' (1.829 m)   Wt 185 lb 3.2 oz (84 kg)   SpO2 98%   BMI 25.12 kg/m     Wt Readings from Last 3 Encounters:  12/04/23 185 lb 3.2 oz (84 kg)  11/26/23 187 lb (84.8 kg)  11/19/23 189 lb 6.4 oz (85.9 kg)     GEN:  Well nourished, well developed in no acute distress HEENT: Normal NECK: No JVD; No carotid bruits LYMPHATICS: No lymphadenopathy CARDIAC: RRR, no murmurs, rubs, gallops RESPIRATORY:  Clear to auscultation without rales, wheezing or rhonchi  ABDOMEN: Soft, non-tender, non-distended MUSCULOSKELETAL:  No edema; No deformity  SKIN: Warm and dry NEUROLOGIC:  Alert and oriented x 3 PSYCHIATRIC:  Normal affect   ASSESSMENT:    1. Coronary artery disease involving native coronary artery of native heart with angina pectoris (HCC)   2. Cigarette smoker   3. Pure hypercholesterolemia   4. Type 2 diabetes mellitus with hyperglycemia, without long-term current use of insulin (HCC)    PLAN:    In order of problems listed above:  CAD with CTO of the mid RCA with left to right collaterals. RCA is a large dominant vessel. It was patent in 2022. He has symptoms refractory to medical therapy. He has no significant stenosis in the left coronary system. We discussed the option of CTO PCI.  This is challenging given length of occlusion, calcification and ambiguous proximal cap. I think the chance of successful is approximately 75%. We discussed the procedure itself with dual arterial access, length of procedure, sedation and need to spend the night in hospital. We discussed risks including but not limited to MI, stroke, death, major vascular injury, bleeding, perforation, and need for emergent surgery. We discussed alternative option of bypass surgery  for revascularization. After full discussion and answering questions patient is agreeable to proceed with attempt at CTO PCI. Will plan on June 19. Will initiate Plavix prior and update labs.       Informed Consent   Shared Decision Making/Informed Consent The risks [stroke (1 in 1000), death (1 in 1000), kidney failure [usually temporary] (1 in 500), bleeding (1 in 200), allergic reaction [possibly serious] (1 in 200)], benefits (diagnostic support and management of coronary artery disease) and alternatives of a cardiac catheterization were discussed in detail with Mr. Nims and he is willing to proceed.       Medication Adjustments/Labs and Tests Ordered: Current medicines are reviewed at length with the patient today.  Concerns regarding medicines are outlined above.  No orders of the defined types were placed in this encounter.  No orders of the defined types were placed in this encounter.   There are no Patient Instructions on file for this visit.   Signed, Hero Kulish [place], MD  12/04/2023 10:58 AM    [facility]

## 2023-12-01 NOTE — Progress Notes (Signed)
 Cardiology Office Note:    Date:  12/04/2023   ID:  Phillip Clark, DOB 1966/06/05, MRN [id number]  PCP:  Arlys Berke, MD   [facility] Providers Cardiologist:  Olinda Bertrand, DO     Referring MD: Arlys Berke, MD   Chief Complaint  Patient presents with   Coronary Artery Disease   Chest Pain    History of Present Illness:    Phillip Clark is a 58 y.o. male is seen at the request of Dr Filiberto Hug for evaluation for potential CTO PCI. He has a history of HTN an HLD. Also history of tobacco abuse. Underwent cardiac cath in 2022 showing nonobstructive CAD. More recently developed typical exertional angina. Medical therapy titrated but symptoms persisted. PET myocardial perfusion imaging revealed a large ischemic area in the apical to basal inferior, infraseptal, and lateral walls, with reduced global myocardial blood flow reserve and transient ischemic dilatation, indicating high risk. He underwent repeat cardiac cath showing occlusion of the mid RCA with left to right collaterals to the PDA and PL branches.  The patient reports to me that he has been having symptoms of chest pain for the past 13 months. He has chest pain worse with exertion. Also complains of palpitations, dizziness, lightheadedness. Symptoms have persisted despite antianginal therapy. He is a smoker and plans to quit. 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 He reports 3 ED visits in the last year due to chest pain.  Past Medical History:  Diagnosis Date   Coronary artery calcification of native artery    Essential tremor    Hyperlipidemia    Hypertension    Nonobstructive atherosclerosis of coronary artery     Past Surgical History:  Procedure Laterality Date   HERNIA REPAIR     LEFT HEART CATH AND CORONARY ANGIOGRAPHY N/A 10/02/2020   Procedure: LEFT HEART CATH AND CORONARY ANGIOGRAPHY;  Surgeon: Cody Das, MD;   Location: [facility];  Service: Cardiovascular;  Laterality: N/A;   LEFT HEART CATH AND CORONARY ANGIOGRAPHY N/A 11/26/2023   Procedure: LEFT HEART CATH AND CORONARY ANGIOGRAPHY;  Surgeon: Cody Das, MD;  Location: [facility];  Service: Cardiovascular;  Laterality: N/A;   VASECTOMY      Current Medications: Current Meds  Medication Sig   aspirin  EC 81 MG tablet Take 1 tablet (81 mg total) by mouth daily. Swallow whole.   atorvastatin  (LIPITOR) 40 MG tablet TAKE 1 TABLET BY MOUTH AT BEDTIME   ezetimibe  (ZETIA ) 10 MG tablet Take 1 tablet by mouth once daily   isosorbide  mononitrate (IMDUR ) 30 MG 24 hr tablet Take 1 tablet (30 mg total) by mouth daily.   isosorbide  mononitrate (IMDUR ) 30 MG 24 hr tablet Take 1 tablet (30 mg total) by mouth daily.   JARDIANCE 25 MG TABS tablet Take 25 mg by mouth daily.   magnesium oxide (MAG-OX) 400 MG tablet Take 400 mg by mouth daily.   metoprolol  succinate (TOPROL -XL) 100 MG 24 hr tablet Take 1 tablet (100 mg total) by mouth daily. WITH OR IMMEDIATELY FOLLOWING A MEAL   Multiple Vitamin (MULTIVITAMIN ADULT PO) Take 1 tablet by mouth daily.   nitroGLYCERIN  (NITROSTAT ) 0.4 MG SL tablet Place 1 tablet (0.4 mg total) under the tongue every 5 (five) minutes as needed for chest pain.   Omega-3 Fatty Acids (FISH OIL) 1000 MG CAPS Take 1 capsule by mouth daily.  Allergies:   Crestor [rosuvastatin calcium ], Rosuvastatin, Latex, Topiramate, Varenicline, and Tape   Social History   Socioeconomic History   Marital status: Married    Spouse name: Not on file   Number of children: 0   Years of education: Not on file   Highest education level: Not on file  Occupational History   Not on file  Tobacco Use   Smoking status: Every Day    Current packs/day: 0.00    Average packs/day: 0.5 packs/day for 34.0 years (17.0 ttl pk-yrs)    Types: Cigarettes    Start date: 29    Last attempt to quit: 2018    Years since quitting: 7.3    Smokeless tobacco: Never  Vaping Use   Vaping status: Never Used  Substance and Sexual Activity   Alcohol use: Not Currently   Drug use: Never   Sexual activity: Yes  Other Topics Concern   Not on file  Social History Narrative   Not on file   Social Drivers of Health   Financial Resource Strain: Not on file  Food Insecurity: Not on file  Transportation Needs: Not on file  Physical Activity: Not on file  Stress: Not on file  Social Connections: Unknown (11/23/2021)   Received from [facility], [facility]   Social Network    Social Network: Not on file     Family History: The patient's family history includes Diabetes in his mother; Heart attack in his father; Hyperlipidemia in his brother, father, mother, sister, sister, sister, and sister; Hypertension in his brother, father, mother, and sister; Thyroid disease in his mother.  ROS:   Please see the history of present illness.     All other systems reviewed and are negative.  EKGs/Labs/Other Studies Reviewed:    The following studies were reviewed today: Echocardiogram 08/01/2022:  Normal LV systolic function with visual EF 65-70%. Left ventricle cavity  is normal in size. Normal left ventricular wall thickness. Normal global  wall motion. Normal diastolic filling pattern, normal LAP.  No significant valvular heart disease.  Compared to 09/30/2020 otherwise no significant chang   Event monitor 10/02/23: Study Highlights  Cardiac monitor (Zio Patch): September 21, 2023-September 25, 2023 Dominant rhythm sinus. Heart rate 48-116 bpm.  Avg HR 78 bpm. No atrial fibrillation detected during the monitoring period. No supraventricular tachycardia, ventricular tachycardia, high grade AV block, pauses (3 seconds or longer). Total supraventricular ectopic burden <1%. Total ventricular ectopic burden 6.1% (predominantly isolated beats). Patient triggered events: 9.  These episodes illustrate underlying rhythm to be sinus with  ventricular ectopy (majority of the time).  PET CT 11/11/23: Study Result  Narrative & Impression      Large reversible perfusion defect in apical to basal inferior/inferoseptal/inferolateral walls, consistent with ischemia.  Myocardial blood flow reserve is reduced globally and in each coronary distribution.  In addition, high risk findings are present, including TID (1.16) and drop in EF with stress, concerning for severe multivessel disease.  Overall, study is high risk and cardiac catheterization is recommended   LV perfusion is abnormal. There is evidence of ischemia. Defect 1: There is a large defect with moderate reduction in uptake present in the apical to basal inferior, inferolateral and inferoseptal location(s) that is reversible. There is normal wall motion in the defect area. Consistent with ischemia.   Rest left ventricular function is normal. Rest EF: 55%. Stress left ventricular function is normal. Stress EF: 50%. End diastolic cavity size is normal. End systolic cavity size is  normal.   Myocardial blood flow was computed to be 0.32ml/g/min at rest and 1.30ml/g/min at stress. Global myocardial blood flow reserve was 1.47 and was abnormal.   Coronary calcium  was present on the attenuation correction CT images. Moderate coronary calcifications were present. Coronary calcifications were present in the left anterior descending artery, left circumflex artery and right coronary artery distribution(s).   Findings are consistent with ischemia. The study is high risk.   Electronically signed by Carson Clara, MD   Cardiac cath 11/26/23:  LEFT HEART CATH AND CORONARY ANGIOGRAPHY   Conclusion  Coronary angiography 09/26/2023: LM: No significant disease LAD: Prox-mid 20-30% disease Lcx: Prox Lcx, OM1 30% disease RCA: Mid CTO after RV marginal branch           Minimal right-to-right, robust left-to--right collaterals from LAD, both septal and epicardial   LVEDP 22 mmHg       Recommend uptitration of ant anginal therapy and refer to Dr. [place] for CTO revascularization.   Cody Das, MD      Recent Labs: 10/17/2023: Magnesium 2.2 11/19/2023: ALT 24; BUN 31; Creatinine, Ser 0.97; Hemoglobin 14.8; Platelets 266; Potassium 4.9; Sodium 137  Recent Lipid Panel    Component Value Date/Time   CHOL 131 09/05/2022 1244   TRIG 108 09/05/2022 1244   HDL 37 (L) 09/05/2022 1244   CHOLHDL 5.0 10/02/2020 0235   VLDL 17 10/02/2020 0235   LDLCALC 74 09/05/2022 1244   LDLDIRECT 72 09/05/2022 1244     Risk Assessment/Calculations:                Physical Exam:    VS:  BP 114/72 (BP Location: Left Arm, Patient Position: Sitting, Cuff Size: Normal)   Pulse 69   Ht 6' (1.829 m)   Wt 185 lb 3.2 oz (84 kg)   SpO2 98%   BMI 25.12 kg/m     Wt Readings from Last 3 Encounters:  12/04/23 185 lb 3.2 oz (84 kg)  11/26/23 187 lb (84.8 kg)  11/19/23 189 lb 6.4 oz (85.9 kg)     GEN:  Well nourished, well developed in no acute distress HEENT: Normal NECK: No JVD; No carotid bruits LYMPHATICS: No lymphadenopathy CARDIAC: RRR, no murmurs, rubs, gallops RESPIRATORY:  Clear to auscultation without rales, wheezing or rhonchi  ABDOMEN: Soft, non-tender, non-distended MUSCULOSKELETAL:  No edema; No deformity  SKIN: Warm and dry NEUROLOGIC:  Alert and oriented x 3 PSYCHIATRIC:  Normal affect   ASSESSMENT:    1. Coronary artery disease involving native coronary artery of native heart with angina pectoris (HCC)   2. Cigarette smoker   3. Pure hypercholesterolemia   4. Type 2 diabetes mellitus with hyperglycemia, without long-term current use of insulin (HCC)    PLAN:    In order of problems listed above:  CAD with CTO of the mid RCA with left to right collaterals. RCA is a large dominant vessel. It was patent in 2022. He has symptoms refractory to medical therapy. He has no significant stenosis in the left coronary system. We discussed the option of CTO PCI.  This is challenging given length of occlusion, calcification and ambiguous proximal cap. I think the chance of successful is approximately 75%. We discussed the procedure itself with dual arterial access, length of procedure, sedation and need to spend the night in hospital. We discussed risks including but not limited to MI, stroke, death, major vascular injury, bleeding, perforation, and need for emergent surgery. We discussed alternative option of bypass surgery  for revascularization. After full discussion and answering questions patient is agreeable to proceed with attempt at CTO PCI. Will plan on June 19. Will initiate Plavix prior and update labs.       Informed Consent   Shared Decision Making/Informed Consent The risks [stroke (1 in 1000), death (1 in 1000), kidney failure [usually temporary] (1 in 500), bleeding (1 in 200), allergic reaction [possibly serious] (1 in 200)], benefits (diagnostic support and management of coronary artery disease) and alternatives of a cardiac catheterization were discussed in detail with Phillip Clark and he is willing to proceed.       Medication Adjustments/Labs and Tests Ordered: Current medicines are reviewed at length with the patient today.  Concerns regarding medicines are outlined above.  No orders of the defined types were placed in this encounter.  No orders of the defined types were placed in this encounter.   There are no Patient Instructions on file for this visit.   Signed, Hero Kulish [place], MD  12/04/2023 10:58 AM    [facility]

## 2023-12-04 ENCOUNTER — Ambulatory Visit: Attending: Cardiology | Admitting: Cardiology

## 2023-12-04 ENCOUNTER — Encounter: Payer: Self-pay | Admitting: Cardiology

## 2023-12-04 VITALS — BP 114/72 | HR 69 | Ht 72.0 in | Wt 185.2 lb

## 2023-12-04 DIAGNOSIS — I25119 Atherosclerotic heart disease of native coronary artery with unspecified angina pectoris: Secondary | ICD-10-CM | POA: Diagnosis not present

## 2023-12-04 DIAGNOSIS — F17210 Nicotine dependence, cigarettes, uncomplicated: Secondary | ICD-10-CM

## 2023-12-04 DIAGNOSIS — E1165 Type 2 diabetes mellitus with hyperglycemia: Secondary | ICD-10-CM | POA: Diagnosis not present

## 2023-12-04 DIAGNOSIS — E7800 Pure hypercholesterolemia, unspecified: Secondary | ICD-10-CM | POA: Diagnosis not present

## 2023-12-04 MED ORDER — CLOPIDOGREL BISULFATE 75 MG PO TABS: 75.0000 mg | ORAL_TABLET | Freq: Every day | ORAL | 3 refills | Status: DC

## 2023-12-04 NOTE — Patient Instructions (Addendum)
 Medication Instructions:  Start Plavix 75 mg 1 week before cath Thurs 6/12 Continue all other medications *If you need a refill on your cardiac medications before your next appointment, please call your pharmacy*  Lab Work: Bmet,cbc to be done 1 week before cath Thurs 6/12  Testing/Procedures: Cardiac Cath ( CTO ) scheduled at [facility] Thurs 6/19 Arrive at 5:30 am  Follow instructions below   Follow-Up: At [facility], you and your health needs are our priority.  As part of our continuing mission to provide you with exceptional heart care, our providers are all part of one team.  This team includes your primary Cardiologist (physician) and Advanced Practice Providers or APPs (Physician Assistants and Nurse Practitioners) who all work together to provide you with the care you need, when you need it.  Your next appointment:  After cath  Thursday 7/3 at 1:20 pm    Provider:  Dr.Jordan   [place] [facility] A DEPT OF [place] AT [address] A DEPT OF THE [place] [address] [place] [address] Dept: [phone] Loc: [phone]    You are scheduled for a Cardiac Catheterization ( CTO )  on Thursday, June 19 with Dr. Peter Swaziland.  1. Please arrive at the [facility] (Main Entrance A) at [facility]: [address] [place], [place] [address] at 5:30 AM (This time is 2 hour(s) before your procedure to ensure your preparation).   Free valet parking service is available. You will check in at ADMITTING. The support person will be asked to wait in the waiting room.  It is OK to have someone drop you off and come back when you are ready to be discharged.    Special note: Every effort is made to have your procedure done on time. Please understand that emergencies sometimes delay scheduled procedures.  2. Diet: Do not eat solid foods after midnight.  The patient may have clear liquids until 5am upon the day of the  procedure.  3. Labs: You will need to have blood drawn on Thursday 6/12, at [facility] at Dr.Jordan's office. You do not need to be fasting.  4. Medication instructions in preparation for your procedure:   Hold Jardiance 3 days before cath Stop 6/16   Start Plavix 75 mg daily  1 week before cath 6/12      On the morning of your procedure, take your Aspirin  81 mg and Plavix/Clopidogrel and any morning medicines NOT listed above.  You may use sips of water.  5. Plan to go home the same day, you will only stay overnight if medically necessary. 6. Bring a current list of your medications and current insurance cards. 7. You MUST have a responsible person to drive you home. 8. Someone MUST be with you the first 24 hours after you arrive home or your discharge will be delayed. 9. Please wear clothes that are easy to get on and off and wear slip-on shoes.  Thank you for allowing us  to care for you!   --  Invasive Cardiovascular services  We recommend signing up for the patient portal called "MyChart".  Sign up information is provided on this After Visit Summary.  MyChart is used to connect with patients for Virtual Visits (Telemedicine).  Patients are able to view lab/test results, encounter notes, upcoming appointments, etc.  Non-urgent messages can be sent to your provider as well.   To learn more about what  you can do with MyChart, go to [email or web address].

## 2023-12-09 ENCOUNTER — Other Ambulatory Visit: Payer: Self-pay | Admitting: Cardiology

## 2023-12-09 DIAGNOSIS — I25118 Atherosclerotic heart disease of native coronary artery with other forms of angina pectoris: Secondary | ICD-10-CM

## 2023-12-10 ENCOUNTER — Telehealth: Payer: Self-pay | Admitting: Cardiology

## 2023-12-10 ENCOUNTER — Ambulatory Visit: Admitting: Physician Assistant

## 2023-12-10 NOTE — Telephone Encounter (Signed)
 Received [employer] disability form today.  Unable to leave message on voice mail.  I sent Phillip Clark a MyChart message asking him to call me regarding this form.

## 2023-12-11 DIAGNOSIS — Z0279 Encounter for issue of other medical certificate: Secondary | ICD-10-CM

## 2023-12-11 NOTE — Telephone Encounter (Signed)
 Dr.Jordan completed FMLA form.Returned to [employer].

## 2023-12-11 NOTE — Telephone Encounter (Signed)
 Faxed completed forms to [employer] and scanned to chart.  Billing and patient notified.

## 2023-12-11 NOTE — Telephone Encounter (Signed)
 Patient came in today and paid $29 form fee and signed the release of information.  Form placed in Dr. Christophe Cram box.

## 2023-12-25 LAB — CBC WITH DIFFERENTIAL/PLATELET

## 2023-12-25 LAB — BASIC METABOLIC PANEL WITH GFR
BUN/Creatinine Ratio: 20 (ref 9–20)
BUN: 23 mg/dL (ref 6–24)
CO2: 22 mmol/L (ref 20–29)
Calcium: 9.2 mg/dL (ref 8.7–10.2)
Chloride: 101 mmol/L (ref 96–106)
Creatinine, Ser: 1.15 mg/dL (ref 0.76–1.27)
Glucose: 85 mg/dL (ref 70–99)
Potassium: 4.5 mmol/L (ref 3.5–5.2)
Sodium: 138 mmol/L (ref 134–144)
eGFR: 75 mL/min/{1.73_m2} (ref >59)

## 2023-12-26 ENCOUNTER — Ambulatory Visit: Payer: Self-pay

## 2023-12-26 ENCOUNTER — Telehealth: Payer: Self-pay | Admitting: Cardiology

## 2023-12-26 NOTE — Telephone Encounter (Signed)
 Received a call from Jacquelyne Matte PA with [employer].He stated he has patient in office now.Patient cut thumb.He is going to start him on Keflex .He wanted to make sure this will be ok since he is scheduled for a heart cath next 6/19.Advised it is ok to take.I will make Dr.Jordan aware.

## 2023-12-30 ENCOUNTER — Telehealth: Payer: Self-pay | Admitting: *Deleted

## 2023-12-30 NOTE — Telephone Encounter (Signed)
 CTO  scheduled at [facility] for: Thursday January 01, 2024 7:30 AM Arrival time [facility] Main Entrance A at: 5:30 AM  Nothing to eat after midnight prior to procedure, clear liquids until 5 AM day of procedure.  Medication instructions: -Hold:  Jardiance -AM of procedure  -Other usual morning medications can be taken with sips of water including aspirin  81 mg and Plavix  75 mg.  Plan to go home the same day, you will only stay overnight if medically necessary.  You must have responsible adult to drive you home.  Someone must be with you the first 24 hours after you arrive home.  Reviewed procedure instructions with patient.

## 2024-01-01 ENCOUNTER — Encounter (HOSPITAL_COMMUNITY)
Admission: RE | Disposition: A | Discharge status: Home / Self Care | Payer: Self-pay | Source: Home / Self Care | Attending: Cardiology

## 2024-01-01 ENCOUNTER — Other Ambulatory Visit: Payer: Self-pay

## 2024-01-01 ENCOUNTER — Encounter (HOSPITAL_COMMUNITY): Payer: Self-pay | Admitting: Cardiology

## 2024-01-01 ENCOUNTER — Ambulatory Visit (HOSPITAL_COMMUNITY)
Admission: RE | Admit: 2024-01-01 | Discharge: 2024-01-02 | Disposition: A | Discharge status: Home / Self Care | Attending: Cardiology | Admitting: Cardiology

## 2024-01-01 DIAGNOSIS — Z87891 Personal history of nicotine dependence: Secondary | ICD-10-CM | POA: Insufficient documentation

## 2024-01-01 DIAGNOSIS — I2582 Chronic total occlusion of coronary artery: Secondary | ICD-10-CM | POA: Insufficient documentation

## 2024-01-01 DIAGNOSIS — E7800 Pure hypercholesterolemia, unspecified: Secondary | ICD-10-CM | POA: Insufficient documentation

## 2024-01-01 DIAGNOSIS — I209 Angina pectoris, unspecified: Secondary | ICD-10-CM | POA: Diagnosis present

## 2024-01-01 DIAGNOSIS — I25118 Atherosclerotic heart disease of native coronary artery with other forms of angina pectoris: Secondary | ICD-10-CM | POA: Diagnosis not present

## 2024-01-01 DIAGNOSIS — I10 Essential (primary) hypertension: Secondary | ICD-10-CM | POA: Diagnosis not present

## 2024-01-01 DIAGNOSIS — Z7902 Long term (current) use of antithrombotics/antiplatelets: Secondary | ICD-10-CM | POA: Diagnosis not present

## 2024-01-01 DIAGNOSIS — E1165 Type 2 diabetes mellitus with hyperglycemia: Secondary | ICD-10-CM | POA: Insufficient documentation

## 2024-01-01 DIAGNOSIS — I2510 Atherosclerotic heart disease of native coronary artery without angina pectoris: Secondary | ICD-10-CM

## 2024-01-01 DIAGNOSIS — Z79899 Other long term (current) drug therapy: Secondary | ICD-10-CM | POA: Insufficient documentation

## 2024-01-01 HISTORY — PX: CORONARY CTO INTERVENTION: CATH118236

## 2024-01-01 LAB — POCT ACTIVATED CLOTTING TIME
Activated Clotting Time: 245 s
Activated Clotting Time: 268 s
Activated Clotting Time: 325 s
Activated Clotting Time: 550 s
Activated Clotting Time: 285 s
Activated Clotting Time: 291 s
Activated Clotting Time: 314 s
Activated Clotting Time: 314 s
Activated Clotting Time: 256 s
Activated Clotting Time: 193 s
Activated Clotting Time: 170 s

## 2024-01-01 LAB — GLUCOSE, CAPILLARY
Glucose-Capillary: 112 mg/dL — ABNORMAL HIGH (ref 70–99)
Glucose-Capillary: 85 mg/dL (ref 70–99)

## 2024-01-01 MED ORDER — EMPAGLIFLOZIN 25 MG PO TABS
25.0000 mg | ORAL_TABLET | Freq: Every day | ORAL | Status: DC
  Administered 2024-01-02: 25 mg via ORAL
  Filled 2024-01-01 (×2): qty 1

## 2024-01-01 MED ORDER — LIDOCAINE HCL (PF) 1 % IJ SOLN
INTRAMUSCULAR | Status: DC | PRN
  Administered 2024-01-01: 10 mL
  Administered 2024-01-01: 5 mL

## 2024-01-01 MED ORDER — MIDAZOLAM HCL 2 MG/2ML IJ SOLN
INTRAMUSCULAR | Status: AC
  Filled 2024-01-01: qty 2

## 2024-01-01 MED ORDER — SODIUM CHLORIDE 0.9% FLUSH: 3.0000 mL | INTRAVENOUS | Status: DC | PRN

## 2024-01-01 MED ORDER — FENTANYL CITRATE (PF) 100 MCG/2ML IJ SOLN
INTRAMUSCULAR | Status: AC
Start: 2024-01-01 — End: 2024-01-01
  Filled 2024-01-01: qty 2

## 2024-01-01 MED ORDER — SODIUM CHLORIDE 0.9 % IV SOLN
INTRAVENOUS | Status: AC
Start: 2024-01-01 — End: 2024-01-01

## 2024-01-01 MED ORDER — MIDAZOLAM HCL 2 MG/2ML IJ SOLN
INTRAMUSCULAR | Status: DC | PRN
  Administered 2024-01-01 (×3): 1 mg via INTRAVENOUS
  Administered 2024-01-01: 2 mg via INTRAVENOUS
  Administered 2024-01-01: 1 mg via INTRAVENOUS

## 2024-01-01 MED ORDER — METOPROLOL SUCCINATE ER 100 MG PO TB24
100.0000 mg | ORAL_TABLET | Freq: Every day | ORAL | Status: DC
  Administered 2024-01-02: 100 mg via ORAL
  Filled 2024-01-01: qty 1

## 2024-01-01 MED ORDER — LABETALOL HCL 5 MG/ML IV SOLN: 10.0000 mg | INTRAVENOUS | Status: AC | PRN

## 2024-01-01 MED ORDER — MAGNESIUM OXIDE -MG SUPPLEMENT 400 (240 MG) MG PO TABS
400.0000 mg | ORAL_TABLET | Freq: Every day | ORAL | Status: DC
  Administered 2024-01-02: 400 mg via ORAL
  Filled 2024-01-01: qty 1

## 2024-01-01 MED ORDER — OMEGA-3-ACID ETHYL ESTERS 1 G PO CAPS
1.0000 g | ORAL_CAPSULE | Freq: Every day | ORAL | Status: DC
  Administered 2024-01-02: 1 g via ORAL
  Filled 2024-01-01: qty 1

## 2024-01-01 MED ORDER — SODIUM CHLORIDE 0.9 % IV SOLN: 250.0000 mL | INTRAVENOUS | Status: DC | PRN

## 2024-01-01 MED ORDER — SODIUM CHLORIDE 0.9% FLUSH: 3.0000 mL | Freq: Two times a day (BID) | INTRAVENOUS | Status: DC

## 2024-01-01 MED ORDER — HEPARIN SODIUM (PORCINE) 1000 UNIT/ML IJ SOLN
INTRAMUSCULAR | Status: AC
  Filled 2024-01-01: qty 10

## 2024-01-01 MED ORDER — HYDRALAZINE HCL 20 MG/ML IJ SOLN: 10.0000 mg | INTRAMUSCULAR | Status: AC | PRN

## 2024-01-01 MED ORDER — SODIUM CHLORIDE 0.9% FLUSH
3.0000 mL | INTRAVENOUS | Status: DC | PRN
Start: 2024-01-01 — End: 2024-01-02

## 2024-01-01 MED ORDER — HEPARIN (PORCINE) IN NACL 1000-0.9 UT/500ML-% IV SOLN
INTRAVENOUS | Status: DC | PRN
  Administered 2024-01-01 (×4): 500 mL

## 2024-01-01 MED ORDER — ASPIRIN 81 MG PO CHEW: 81.0000 mg | CHEWABLE_TABLET | ORAL | Status: DC

## 2024-01-01 MED ORDER — CEPHALEXIN 500 MG PO CAPS
500.0000 mg | ORAL_CAPSULE | Freq: Three times a day (TID) | ORAL | Status: DC
  Filled 2024-01-01 (×2): qty 1

## 2024-01-01 MED ORDER — FAMOTIDINE IN NACL 20-0.9 MG/50ML-% IV SOLN
INTRAVENOUS | Status: DC | PRN
  Administered 2024-01-01: 20 mg via INTRAVENOUS

## 2024-01-01 MED ORDER — NITROGLYCERIN 0.4 MG SL SUBL: 0.4000 mg | SUBLINGUAL_TABLET | SUBLINGUAL | Status: DC | PRN

## 2024-01-01 MED ORDER — ENOXAPARIN SODIUM 40 MG/0.4ML IJ SOSY
40.0000 mg | PREFILLED_SYRINGE | INTRAMUSCULAR | Status: DC
Start: 2024-01-02 — End: 2024-01-02
  Filled 2024-01-01: qty 0.4

## 2024-01-01 MED ORDER — HEPARIN SODIUM (PORCINE) 1000 UNIT/ML IJ SOLN
INTRAMUSCULAR | Status: DC | PRN
  Administered 2024-01-01: 9000 [IU] via INTRAVENOUS
  Administered 2024-01-01 (×6): 3000 [IU] via INTRAVENOUS

## 2024-01-01 MED ORDER — SODIUM CHLORIDE 0.9 % IV SOLN: INTRAVENOUS | Status: AC

## 2024-01-01 MED ORDER — ACETAMINOPHEN 325 MG PO TABS: 650.0000 mg | ORAL_TABLET | ORAL | Status: DC | PRN

## 2024-01-01 MED ORDER — FAMOTIDINE IN NACL 20-0.9 MG/50ML-% IV SOLN
INTRAVENOUS | Status: AC
  Filled 2024-01-01: qty 50

## 2024-01-01 MED ORDER — ASPIRIN 81 MG PO TBEC
81.0000 mg | DELAYED_RELEASE_TABLET | Freq: Every day | ORAL | Status: DC
  Administered 2024-01-02: 81 mg via ORAL
  Filled 2024-01-01: qty 1

## 2024-01-01 MED ORDER — ATORVASTATIN CALCIUM 40 MG PO TABS
40.0000 mg | ORAL_TABLET | Freq: Every day | ORAL | Status: DC
  Administered 2024-01-01: 40 mg via ORAL
  Filled 2024-01-01: qty 1

## 2024-01-01 MED ORDER — SODIUM CHLORIDE 0.9 % IV SOLN: INTRAVENOUS | Status: DC

## 2024-01-01 MED ORDER — CLOPIDOGREL BISULFATE 75 MG PO TABS
75.0000 mg | ORAL_TABLET | Freq: Every day | ORAL | Status: DC
  Filled 2024-01-01: qty 1

## 2024-01-01 MED ORDER — ADULT MULTIVITAMIN W/MINERALS CH
1.0000 | ORAL_TABLET | Freq: Every day | ORAL | Status: DC
  Administered 2024-01-02: 1 via ORAL
  Filled 2024-01-01: qty 1

## 2024-01-01 MED ORDER — ONDANSETRON HCL 4 MG/2ML IJ SOLN: 4.0000 mg | Freq: Four times a day (QID) | INTRAMUSCULAR | Status: DC | PRN

## 2024-01-01 MED ORDER — IOHEXOL 350 MG/ML SOLN
INTRAVENOUS | Status: DC | PRN
  Administered 2024-01-01: 250 mL

## 2024-01-01 MED ORDER — ENOXAPARIN SODIUM 40 MG/0.4ML IJ SOSY: 40.0000 mg | PREFILLED_SYRINGE | INTRAMUSCULAR | Status: DC

## 2024-01-01 MED ORDER — FENTANYL CITRATE (PF) 100 MCG/2ML IJ SOLN
INTRAMUSCULAR | Status: DC | PRN
Start: 2024-01-01 — End: 2024-01-01
  Administered 2024-01-01: 25 ug via INTRAVENOUS
  Administered 2024-01-01: 50 ug via INTRAVENOUS
  Administered 2024-01-01 (×2): 25 ug via INTRAVENOUS
  Administered 2024-01-01: 50 ug via INTRAVENOUS
  Administered 2024-01-01 (×2): 25 ug via INTRAVENOUS

## 2024-01-01 MED ORDER — EZETIMIBE 10 MG PO TABS
10.0000 mg | ORAL_TABLET | Freq: Every day | ORAL | Status: DC
  Administered 2024-01-01: 10 mg via ORAL
  Filled 2024-01-01: qty 1

## 2024-01-01 MED ORDER — FENTANYL CITRATE (PF) 100 MCG/2ML IJ SOLN
INTRAMUSCULAR | Status: AC
  Filled 2024-01-01: qty 2

## 2024-01-01 MED ORDER — ISOSORBIDE MONONITRATE ER 30 MG PO TB24
30.0000 mg | ORAL_TABLET | Freq: Two times a day (BID) | ORAL | Status: DC
  Administered 2024-01-02: 30 mg via ORAL
  Filled 2024-01-01: qty 1

## 2024-01-01 MED ORDER — LIDOCAINE HCL (PF) 1 % IJ SOLN
INTRAMUSCULAR | Status: AC
  Filled 2024-01-01: qty 30

## 2024-01-01 MED ORDER — RANOLAZINE ER 500 MG PO TB12
500.0000 mg | ORAL_TABLET | Freq: Two times a day (BID) | ORAL | Status: DC
  Administered 2024-01-02: 500 mg via ORAL
  Filled 2024-01-01: qty 1

## 2024-01-01 NOTE — Progress Notes (Signed)
 I reviewed with patient findings from his procedure today.   Patient's anatomy pretty unfavorable for CTO PCI due to long occlusion, calcification, occlusion at distal bifurcation and ambiguous cap. His attempt at CTO PCI was unsuccessful due to this.   I discussed options at this point. This includes added medical therapy but he has had persistent symptoms despite good doses of metoprolol  and Imdur . Other options include referral to another operator to attempt CTO PCI but based on findings today I still think success would be difficult. The other option is CABG and the patient prefers this approach. Will discontinue Plavix . Go ahead and refer to see CT surgery as outpatient.   Duong Haydel Swaziland MD, [facility]

## 2024-01-01 NOTE — Progress Notes (Signed)
 Site area: Left femoral artery Site Prior to Removal:  Level 0 Pressure Applied For: Manual:   yes Patient Status During Pull:  Stable Post Pull Site:  Level 0 Post Pull Instructions Given:  yes w/ teach back Post Pull Pulses Present: +2 DP Dressing Applied:  gauze w/ tegaderm Bedrest begins @ 1800 Comments: Bruising noted around insertion site. Large round, moveable mass extending down groin palpated during hold and unchanged post hold.Pt denies pain. Additional pressure held over entire site for , and it remained the same. Borders marked.

## 2024-01-01 NOTE — Progress Notes (Signed)
 Dr. Swaziland notified that pt is with soft mass to left groin, see Alisa App, RN's note, pt denies pain to the area, no new orders obtained, safety maintained

## 2024-01-01 NOTE — Interval H&P Note (Signed)
 History and Physical Interval Note:  01/01/2024 6:55 AM  Phillip Clark  has presented today for surgery, with the diagnosis of cto - cad.  The various methods of treatment have been discussed with the patient and family. After consideration of risks, benefits and other options for treatment, the patient has consented to  Procedure(s): CORONARY CTO INTERVENTION (N/A) as a surgical intervention.  The patient's history has been reviewed, patient examined, no change in status, stable for surgery.  I have reviewed the patient's chart and labs.  Questions were answered to the patient's satisfaction.   Cath Lab Visit (complete for each Cath Lab visit)  Clinical Evaluation Leading to the Procedure:   ACS: No.  Non-ACS:    Anginal Classification: CCS III  Anti-ischemic medical therapy: Maximal Therapy (2 or more classes of medications)  Non-Invasive Test Results: High-risk stress test findings: cardiac mortality >3%/year  Prior CABG: No previous CABG        Phillip Clark [facility] 01/01/2024 6:56 AM

## 2024-01-01 NOTE — Progress Notes (Signed)
 SITE AREA: right groin/femoral  SITE PRIOR TO REMOVAL:  LEVEL 0  PRESSURE APPLIED FOR: approximately 40 minutes  MANUAL: yes  PATIENT STATUS DURING PULL: stable  POST PULL SITE:  LEVEL 0  POST PULL INSTRUCTIONS GIVEN: yes, drsg x 24 hours, may shower tomorrow, no sitting in water x 1 week, no hot tubs, bath tubs, or pools, no running or jumping, climb easy into and out of vehicles and on stairs, if you feel anything warm/moist to groin areas, please notify staff, if bleeding occurs at home, call 911  POST PULL PULSES PRESENT: right pedal pulse at +2  DRESSING APPLIED: gauze with tegaderm  BEDREST BEGINS @ after left groin sheath removal completed, see next noted  COMMENTS: bruising noted around insertion site

## 2024-01-02 ENCOUNTER — Other Ambulatory Visit (HOSPITAL_COMMUNITY): Payer: Self-pay

## 2024-01-02 ENCOUNTER — Telehealth: Payer: Self-pay | Admitting: Cardiology

## 2024-01-02 ENCOUNTER — Encounter (HOSPITAL_COMMUNITY): Payer: Self-pay | Admitting: Cardiology

## 2024-01-02 DIAGNOSIS — Z87891 Personal history of nicotine dependence: Secondary | ICD-10-CM | POA: Diagnosis not present

## 2024-01-02 DIAGNOSIS — I2582 Chronic total occlusion of coronary artery: Secondary | ICD-10-CM | POA: Diagnosis not present

## 2024-01-02 DIAGNOSIS — I209 Angina pectoris, unspecified: Secondary | ICD-10-CM | POA: Diagnosis not present

## 2024-01-02 DIAGNOSIS — I25118 Atherosclerotic heart disease of native coronary artery with other forms of angina pectoris: Secondary | ICD-10-CM | POA: Diagnosis not present

## 2024-01-02 DIAGNOSIS — I10 Essential (primary) hypertension: Secondary | ICD-10-CM | POA: Diagnosis not present

## 2024-01-02 LAB — CBC
HCT: 41.6 % (ref 39.0–52.0)
Hemoglobin: 14 g/dL (ref 13.0–17.0)
MCH: 30.7 pg (ref 26.0–34.0)
MCHC: 33.7 g/dL (ref 30.0–36.0)
MCV: 91.2 fL (ref 80.0–100.0)
Platelets: 229 10*3/uL (ref 150–400)
RBC: 4.56 MIL/uL (ref 4.22–5.81)
RDW: 12.6 % (ref 11.5–15.5)
WBC: 7.7 10*3/uL (ref 4.0–10.5)
nRBC: 0 % (ref 0.0–0.2)

## 2024-01-02 LAB — BASIC METABOLIC PANEL WITH GFR
Anion gap: 10 (ref 5–15)
BUN: 16 mg/dL (ref 6–20)
CO2: 23 mmol/L (ref 22–32)
Calcium: 8.7 mg/dL — ABNORMAL LOW (ref 8.9–10.3)
Chloride: 104 mmol/L (ref 98–111)
Creatinine, Ser: 0.92 mg/dL (ref 0.61–1.24)
GFR, Estimated: >60 mL/min (ref >60)
Glucose, Bld: 114 mg/dL — ABNORMAL HIGH (ref 70–99)
Potassium: 3.7 mmol/L (ref 3.5–5.1)
Sodium: 137 mmol/L (ref 135–145)

## 2024-01-02 MED ORDER — RANOLAZINE ER 500 MG PO TB12
500.0000 mg | ORAL_TABLET | Freq: Two times a day (BID) | ORAL | 11 refills | Status: DC
  Filled 2024-01-02: qty 60, 30d supply, fill #0

## 2024-01-02 MED ORDER — ISOSORBIDE MONONITRATE ER 30 MG PO TB24: 30.0000 mg | ORAL_TABLET | Freq: Two times a day (BID) | ORAL | Status: DC

## 2024-01-02 MED ORDER — RANOLAZINE ER 500 MG PO TB12: 500.0000 mg | ORAL_TABLET | Freq: Two times a day (BID) | ORAL | 11 refills | Status: DC

## 2024-01-02 MED ORDER — ISOSORBIDE MONONITRATE ER 30 MG PO TB24: 30.0000 mg | ORAL_TABLET | Freq: Two times a day (BID) | ORAL | 11 refills | Status: DC

## 2024-01-02 NOTE — Telephone Encounter (Signed)
 Patient called to verify the new medications that were called to pharmacy. Ranexa and IMDUR  sent today. He verbalized understanding

## 2024-01-02 NOTE — Plan of Care (Signed)

## 2024-01-02 NOTE — Discharge Summary (Signed)
 Discharge Summary   Patient ID: Phillip Clark MRN: [id number]; DOB: Feb 01, 1966  Admit date: 01/01/2024 Discharge date: 01/02/2024  PCP:  Arlys Berke, MD   [facility] Providers Cardiologist:  Olinda Bertrand, DO       Discharge Diagnoses  Principal Problem:   Angina pectoris [facility])   Diagnostic Studies/Procedures   01/01/24 LHC    Mid LAD lesion is 30% stenosed.   Prox LAD lesion is 20% stenosed.   Prox Cx lesion is 30% stenosed.   Prox RCA lesion is 20% stenosed.   Dist RCA lesion is 20% stenosed.   1st Mrg lesion is 30% stenosed.   Mid RCA lesion is 100% stenosed.   Post intervention, there is a 100% residual stenosis.   The radiation dose exceeded thresholds defined in the Patient Radiation Dose Management For Interventional Medical Procedures With Extensive Use of Fluoroscopy policy. Specific follow up instructions will be provided to the patient prior to discharge.   on 01/02/2024.   Unsuccessful CTO of the RCA using antegrade and retrograde approaches. Unable to cross with wire.   Plan: will add Ranexa to medical therapy. Options include referral to another CTO operator or consideration of CABG. _____________   History of Present Illness   Phillip Clark is a 58 y.o. male was seen by Dr. Swaziland on 12/04/23 at the request of Dr Filiberto Hug for evaluation for potential CTO PCI. He has a history of HTN an HLD. Also history of tobacco abuse. Underwent cardiac cath in 2022 showing nonobstructive CAD. More recently developed typical exertional angina. Medical therapy titrated but symptoms persisted. PET myocardial perfusion imaging revealed a large ischemic area in the apical to basal inferior, infraseptal, and lateral walls, with reduced global myocardial blood flow reserve and transient ischemic dilatation, indicating high risk. He underwent repeat cardiac cath showing occlusion of the mid RCA with left to right collaterals to the PDA and PL branches.  The patient  reported to Dr. Swaziland that he has been having symptoms of chest pain for the past 13 months. He has chest pain worse with exertion. Also complains of palpitations, dizziness, lightheadedness. Symptoms have persisted despite antianginal therapy. He is a smoker and plans to quit. Given ongoing symptoms, decision made to plan for CTO PCI. Patient presented for this on 6/19.   Hospital Course   Consultants: n/a  CAD Patient with hx LHC 10/02/20: pLAD 20, mLAD 30, pLCx 30, OM1 30 ,RPDA 20. Had repeat LHC in May of this year with findings of mid RCA CTO, otherwise stable disease. Referred for CTO PCI. Despite long case/multiple efforts, Dr. Swaziland was unable to cross CTO with wire. Upon further discussion with patient, Dr. Swaziland is recommending outpatient referral to CT surgery. Given referral to CT surgery for possible CABG, will stop Plavix  and continue with ASA 81mg  only. Continue Atorvastatin  40mg  with Zetia  10mg  Continue Imdur  30mg  BID Continue Toprol  XL 100mg  Start Ranexa 500mg  BID  Hypercholesterolemia Patient with prior statin induced myalgias now tolerating Atorvastatin  and Zetia .  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 Continue Toprol  XL 100mg .        Due to the nature of the diagnostic procedure, the patient received prolonged fluoroscopy time and should therefore be examined in follow-up for adverse radiation skin effects. The patient was educated to monitor for skin changes and understands to notify our office of such.     Did the patient have an acute coronary syndrome (MI, NSTEMI, STEMI,  etc) this admission?:  No                               Did the patient have a percutaneous coronary intervention (stent / angioplasty)?:  No.          _____________  Discharge Vitals Blood pressure (!) 106/58, pulse 68, temperature 98.2 F (36.8 C), temperature source Oral, resp. rate 17, height 6' (1.829 m), weight 83.9 kg, SpO2  96%.  Filed Weights   01/01/24 0530  Weight: 83.9 kg   Physical Exam Vitals reviewed.  Constitutional:      Appearance: Normal appearance.  HENT:     Head: Normocephalic.   Eyes:     Pupils: Pupils are equal, round, and reactive to light.    Cardiovascular:     Rate and Rhythm: Normal rate and regular rhythm.     Pulses: Normal pulses.     Heart sounds: Normal heart sounds.  Pulmonary:     Effort: Pulmonary effort is normal.     Breath sounds: Normal breath sounds.  Abdominal:     General: Abdomen is flat.     Palpations: Abdomen is soft.   Musculoskeletal:     Right lower leg: No edema.     Left lower leg: No edema.   Skin:    General: Skin is warm and dry.     Capillary Refill: Capillary refill takes less than 2 seconds.     Comments: Small left groin hematoma with surrounding ecchymosis. Minimal change overnight per skin markings. Right groin with small amount of ecchymosis as well.   Neurological:     General: No focal deficit present.     Mental Status: He is alert and oriented to person, place, and time.   Psychiatric:        Mood and Affect: Mood normal.        Behavior: Behavior normal.        Thought Content: Thought content normal.        Judgment: Judgment normal.      Labs & Radiologic Studies  CBC Recent Labs    01/02/24 0348  WBC 7.7  HGB 14.0  HCT 41.6  MCV 91.2  PLT 229   Basic Metabolic Panel Recent Labs    16/10/96 0348  NA 137  K 3.7  CL 104  CO2 23  GLUCOSE 114*  BUN 16  CREATININE 0.92  CALCIUM  8.7*   Liver Function Tests No results for input(s): AST, ALT, ALKPHOS, BILITOT, PROT, ALBUMIN in the last 72 hours. No results for input(s): LIPASE, AMYLASE in the last 72 hours. High Sensitivity Troponin:   No results for input(s): TROPONINIHS in the last 720 hours.  No results for input(s): TRNPT in the last 720 hours.  BNP Invalid input(s): POCBNP No results for input(s): PROBNP in the last 72  hours.  No results for input(s): BNP in the last 72 hours.  D-Dimer No results for input(s): DDIMER in the last 72 hours. Hemoglobin A1C No results for input(s): HGBA1C in the last 72 hours. Fasting Lipid Panel No results for input(s): CHOL, HDL, LDLCALC, TRIG, CHOLHDL, LDLDIRECT in the last 72 hours. Lipoprotein (a)  Date/Time Value Ref Range Status  08/07/2020 08:10 AM <8.4 <75.0 nmol/L Final    Comment:    **Results verified by repeat testing** Note:  Values greater than or equal to 75.0 nmol/L may        indicate an  independent risk factor for CHD,        but must be evaluated with caution when applied        to non-Caucasian populations due to the        influence of genetic factors on Lp(a) across        ethnicities.     Thyroid Function Tests No results for input(s): TSH, T4TOTAL, T3FREE, THYROIDAB in the last 72 hours.  Invalid input(s): FREET3 _____________  CARDIAC CATHETERIZATION Result Date: 01/01/2024   Mid LAD lesion is 30% stenosed.   Prox LAD lesion is 20% stenosed.   Prox Cx lesion is 30% stenosed.   Prox RCA lesion is 20% stenosed.   Dist RCA lesion is 20% stenosed.   1st Mrg lesion is 30% stenosed.   Mid RCA lesion is 100% stenosed.   Post intervention, there is a 100% residual stenosis.   The radiation dose exceeded thresholds defined in the Patient Radiation Dose Management For Interventional Medical Procedures With Extensive Use of Fluoroscopy policy. Specific follow up instructions will be provided to the patient prior to discharge.   on 01/02/2024. Unsuccessful CTO of the RCA using antegrade and retrograde approaches. Unable to cross with wire. Plan: will add Ranexa to medical therapy. Options include referral to another CTO operator or consideration of CABG. Will discuss with patient    Disposition Pt is being discharged home today in good condition.  Follow-up Plans & Appointments 01/23/24 @10 :50am with Dr.  Deloise Ferries.    Discharge Medications Allergies as of 01/02/2024       Reactions   Latex Rash   Severe   Crestor [rosuvastatin Calcium ] Other (See Comments)   MYALGIAS   Rosuvastatin Other (See Comments)   Other reaction(s): Myalgias (intolerance)   Other Other (See Comments)   Steroid/Caused blood Glucose to increase   Topiramate Other (See Comments)   Tingling legs   Varenicline Other (See Comments)   Suicidal idea   Tape Rash   takes skins off        Medication List     STOP taking these medications    cephALEXin  500 MG capsule Commonly known as: KEFLEX        TAKE these medications    aspirin  EC 81 MG tablet Take 1 tablet (81 mg total) by mouth daily. Swallow whole.   atorvastatin  40 MG tablet Commonly known as: LIPITOR TAKE 1 TABLET BY MOUTH AT BEDTIME   clopidogrel  75 MG tablet Commonly known as: PLAVIX  Take 1 tablet (75 mg total) by mouth daily.   ezetimibe  10 MG tablet Commonly known as: ZETIA  Take 1 tablet by mouth once daily   Fish Oil 1000 MG Caps Take 1,000 mg by mouth daily.   isosorbide  mononitrate 30 MG 24 hr tablet Commonly known as: IMDUR  Take 1 tablet (30 mg total) by mouth 2 (two) times daily.   Jardiance 25 MG Tabs tablet Generic drug: empagliflozin Take 25 mg by mouth daily.   magnesium oxide 400 MG tablet Commonly known as: MAG-OX Take 400 mg by mouth daily.   metoprolol  succinate 100 MG 24 hr tablet Commonly known as: TOPROL -XL Take 1 tablet (100 mg total) by mouth daily. WITH OR IMMEDIATELY FOLLOWING A MEAL   MULTIVITAMIN ADULT PO Take 1 tablet by mouth daily.   nitroGLYCERIN  0.4 MG SL tablet Commonly known as: NITROSTAT  Place 1 tablet (0.4 mg total) under the tongue every 5 (five) minutes as needed for chest pain.   ranolazine 500 MG 12 hr tablet Commonly known  as: RANEXA Take 1 tablet (500 mg total) by mouth 2 (two) times daily.         Outstanding Labs/Studies   Duration of Discharge Encounter: APP  Time: 25 minutes   Signed, Leala Prince, PA-C 01/02/2024, 10:48 AM   Patient seen, examined. Available data reviewed. Agree with findings, assessment, and plan as outlined by Leala Prince, PA-C.  Patient alert, oriented, in no distress.  HEENT is normal, JVP is normal, lungs are clear bilaterally, heart regular rate and rhythm no murmur or gallop, right groin site is clear with no hematoma or ecchymosis.  Left groin site has surrounding ecchymosis and mild tenderness but no firm hematoma.  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  This was unsuccessful but there were no procedural complications.  Plans noted for cardiac surgical referral as outlined above.  Patient medically stable for discharge today.  Agree with holding clopidogrel  in anticipation of possible single-vessel CABG.  MD time conducting this discharge is equal to 20 minutes.  This includes review of his cardiac catheterization films, review of clinical notes, personal exam of the patient, counseling of the patient and his wife at bedside regarding treatment options for his CAD and chronic angina.  Arnoldo Lapping, M.D. 01/02/2024 10:48 AM

## 2024-01-02 NOTE — Progress Notes (Signed)
 DISCHARGE NOTE HOME Phillip Clark to be discharged Home per MD order. Discussed prescriptions and follow up appointments with the patient. Prescriptions given to patient; medication list explained in detail. Patient verbalized understanding.  Skin clean, dry and intact without evidence of skin break down, no evidence of skin tears noted. IV catheter discontinued intact. Site without signs and symptoms of complications. Dressing and pressure applied. Pt denies pain at the site currently. No complaints noted.  Patient free of lines, drains, and wounds.   An After Visit Summary (AVS) was printed and given to the patient. Patient escorted via wheelchair, and discharged home via private auto.  [name], RN

## 2024-01-02 NOTE — Plan of Care (Signed)

## 2024-01-02 NOTE — Care Management (Addendum)
  Transition of Care [facility]) Screening Note   Patient Details  Name: CASHIS RILL Date of Birth: 10-Feb-1966   Transition of Care [facility]) CM/SW Contact:    Ronni Colace, RN Phone Number: 01/02/2024, 8:19 AM    Transition of Care Department [facility]) has reviewed patient and no TOC needs have been identified at this time. We will continue to monitor patient advancement through interdisciplinary progression rounds. If new patient transition needs arise, please place a TOC consult. Patient will refer to TCTS as outpatinet

## 2024-01-02 NOTE — Telephone Encounter (Signed)
 Pt would like to know what new medication Dr. Swaziland wanted him to start. Pleae advise

## 2024-01-02 NOTE — Progress Notes (Signed)
 CARDIAC REHAB PHASE I    CRP1 referral received. Unsuccessful CTO of RCA, pending further evaluation and treatment. Pt currently not appropriate for CRP2. Will discontinue CRP2 referral.  Ronny Colas, RN BSN 01/02/2024 8:37 AM

## 2024-01-02 NOTE — Discharge Instructions (Signed)
 Your heart catheterization required fluoroscopy, a type of x-ray that allowed your heart doctor to fully evaluate your heart arteries. Although very uncommon, the radiation used to take pictures can cause skin changes over time if you required a prolonged procedure. Please monitor the skin on your chest and back over the next several months for any reddening, peeling, or changes in appearance. Please notify your doctor if you experience this.   Groin Site Care Refer to this sheet in the next few weeks. These instructions provide you with information on caring for yourself after your procedure. Your caregiver may also give you more specific instructions. Your treatment has been planned according to current medical practices, but problems sometimes occur. Call your caregiver if you have any problems or questions after your procedure. HOME CARE INSTRUCTIONS You may shower 24 hours after the procedure. Remove the bandage (dressing) and gently wash the site with plain soap and water. Gently pat the site dry.  Do not apply powder or lotion to the site.  Do not sit in a bathtub, swimming pool, or whirlpool for 5 to 7 days.  No bending, squatting, or lifting anything over 10 pounds (4.5 kg) as directed by your caregiver.  Inspect the site at least twice daily.  Do not drive home if you are discharged the same day of the procedure. Have someone else drive you.  You may drive 24 hours after the procedure unless otherwise instructed by your caregiver.  What to expect: Any bruising will usually fade within 1 to 2 weeks.  Blood that collects in the tissue (hematoma) may be painful to the touch. It should usually decrease in size and tenderness within 1 to 2 weeks.  SEEK IMMEDIATE MEDICAL CARE IF: You have unusual pain at the groin site or down the affected leg.  You have redness, warmth, swelling, or pain at the groin site.  You have drainage (other than a small amount of blood on the dressing).  You have  chills.  You have a fever or persistent symptoms for more than 72 hours.  You have a fever and your symptoms suddenly get worse.  Your leg becomes pale, cool, tingly, or numb.  You have heavy bleeding from the site. Hold pressure on the site. Aaron Aas

## 2024-01-03 ENCOUNTER — Telehealth: Payer: Self-pay | Admitting: Cardiology

## 2024-01-03 MED ORDER — RANOLAZINE ER 500 MG PO TB12
500.0000 mg | ORAL_TABLET | Freq: Two times a day (BID) | ORAL | 6 refills | Status: DC
Start: 2024-01-03 — End: 2024-02-02

## 2024-01-03 NOTE — Telephone Encounter (Signed)
 Paged by nurse today reporting that patient had never received Ranexa  prescription.  Reportedly this was a printed prescription.  I am going to send Ranexa  500 mg twice daily to [facility].

## 2024-01-09 NOTE — Progress Notes (Unsigned)
 Cardiology Office Note:    Date:  01/15/2024   ID:  Phillip Clark, DOB 07/28/1965, MRN [id number]  PCP:  [name], MD   [facility] Providers Cardiologist:  Madonna Large, DO     Referring MD: [name], MD   Chief Complaint  Patient presents with   Coronary Artery Disease    History of Present Illness:    Phillip Clark is a 58 y.o. male is seen at the request of Dr Elmira for evaluation for potential CTO PCI. He has a history of HTN an HLD. Also history of tobacco abuse. Underwent cardiac cath in 2022 showing nonobstructive CAD. More recently developed typical exertional angina. Medical therapy titrated but symptoms persisted. PET myocardial perfusion imaging revealed a large ischemic area in the apical to basal inferior, infraseptal, and lateral walls, with reduced global myocardial blood flow reserve and transient ischemic dilatation, indicating high risk. He underwent repeat cardiac cath showing occlusion of the mid RCA with left to right collaterals to the PDA and PL branches.  The patient reports to me that he has been having symptoms of chest pain for the past 13 months. He has chest pain worse with exertion. Also complains of palpitations, dizziness, lightheadedness. Symptoms have persisted despite antianginal therapy. He is a smoker and plans to quit. 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 He reports 3 ED visits in the last year due to chest pain.  He underwent attempted CTO PCI of the RCA on 01/01/24. This was unsuccessful. Unable to cross lesion with either antegrade and retrograde approaches. We did place him on Ranexa . This has helped but he still has limiting angina with activity and under stress.   Past Medical History:  Diagnosis Date   Coronary artery calcification of native artery    Essential tremor    Hyperlipidemia    Hypertension    Nonobstructive atherosclerosis of  coronary artery     Past Surgical History:  Procedure Laterality Date   CORONARY CTO INTERVENTION N/A 01/01/2024   Procedure: CORONARY CTO INTERVENTION;  Surgeon: Swaziland, Mckenzie Bove M, MD;  Location: [facility] INVASIVE CV LAB;  Service: Cardiovascular;  Laterality: N/A;   HERNIA REPAIR     LEFT HEART CATH AND CORONARY ANGIOGRAPHY N/A 10/02/2020   Procedure: LEFT HEART CATH AND CORONARY ANGIOGRAPHY;  Surgeon: [name], MD;  Location: [facility] INVASIVE CV LAB;  Service: Cardiovascular;  Laterality: N/A;   LEFT HEART CATH AND CORONARY ANGIOGRAPHY N/A 11/26/2023   Procedure: LEFT HEART CATH AND CORONARY ANGIOGRAPHY;  Surgeon: [name], MD;  Location: [facility] INVASIVE CV LAB;  Service: Cardiovascular;  Laterality: N/A;   VASECTOMY      Current Medications: Current Meds  Medication Sig   aspirin  EC 81 MG tablet Take 1 tablet (81 mg total) by mouth daily. Swallow whole.   atorvastatin  (LIPITOR) 40 MG tablet TAKE 1 TABLET BY MOUTH AT BEDTIME   ezetimibe  (ZETIA ) 10 MG tablet Take 1 tablet by mouth once daily   isosorbide  mononitrate (IMDUR ) 30 MG 24 hr tablet Take 1 tablet (30 mg total) by mouth 2 (two) times daily.   JARDIANCE  25 MG TABS tablet Take 25 mg by mouth daily.   magnesium  oxide (MAG-OX) 400 MG tablet Take 400 mg by mouth daily.   metoprolol  succinate (TOPROL -XL) 100 MG 24 hr tablet Take 1 tablet (100 mg total) by mouth daily. WITH OR IMMEDIATELY FOLLOWING A  MEAL   Multiple Vitamin (MULTIVITAMIN ADULT PO) Take 1 tablet by mouth daily.   Omega-3 Fatty Acids (FISH OIL) 1000 MG CAPS Take 1,000 mg by mouth daily.   ranolazine  (RANEXA ) 500 MG 12 hr tablet Take 1 tablet (500 mg total) by mouth 2 (two) times daily.   [DISCONTINUED] nitroGLYCERIN  (NITROSTAT ) 0.4 MG SL tablet Place 1 tablet (0.4 mg total) under the tongue every 5 (five) minutes as needed for chest pain.     Allergies:   Latex, Crestor [rosuvastatin calcium ], Rosuvastatin, Other, Topiramate, Varenicline, and Tape   Social  History   Socioeconomic History   Marital status: Married    Spouse name: Not on file   Number of children: 0   Years of education: Not on file   Highest education level: Not on file  Occupational History   Not on file  Tobacco Use   Smoking status: Every Day    Current packs/day: 0.00    Average packs/day: 0.5 packs/day for 34.0 years (17.0 ttl pk-yrs)    Types: Cigarettes    Start date: 64    Last attempt to quit: 2018    Years since quitting: 7.5   Smokeless tobacco: Never  Vaping Use   Vaping status: Never Used  Substance and Sexual Activity   Alcohol use: Not Currently   Drug use: Never   Sexual activity: Yes  Other Topics Concern   Not on file  Social History Narrative   Not on file   Social Drivers of Health   Financial Resource Strain: Not on file  Food Insecurity: No Food Insecurity (01/01/2024)   Hunger Vital Sign    Worried About Running Out of Food in the Last Year: Never true    Ran Out of Food in the Last Year: Never true  Transportation Needs: No Transportation Needs (01/01/2024)   PRAPARE - Administrator, Civil Service (Medical): No    Lack of Transportation (Non-Medical): No  Physical Activity: Not on file  Stress: Not on file  Social Connections: Unknown (11/23/2021)   Received from [facility]   Social Network    Social Network: Not on file     Family History: The patient's family history includes Diabetes in his mother; Heart attack in his father; Hyperlipidemia in his brother, father, mother, sister, sister, sister, and sister; Hypertension in his brother, father, mother, and sister; Thyroid disease in his mother.  ROS:   Please see the history of present illness.     All other systems reviewed and are negative.  EKGs/Labs/Other Studies Reviewed:    The following studies were reviewed today: Echocardiogram 08/01/2022:  Normal LV systolic function with visual EF 65-70%. Left ventricle cavity  is normal in size. Normal left  ventricular wall thickness. Normal global  wall motion. Normal diastolic filling pattern, normal LAP.  No significant valvular heart disease.  Compared to 09/30/2020 otherwise no significant chang   Event monitor 10/02/23: Study Highlights  Cardiac monitor (Zio Patch): September 21, 2023-September 25, 2023 Dominant rhythm sinus. Heart rate 48-116 bpm.  Avg HR 78 bpm. No atrial fibrillation detected during the monitoring period. No supraventricular tachycardia, ventricular tachycardia, high grade AV block, pauses (3 seconds or longer). Total supraventricular ectopic burden <1%. Total ventricular ectopic burden 6.1% (predominantly isolated beats). Patient triggered events: 9.  These episodes illustrate underlying rhythm to be sinus with ventricular ectopy (majority of the time).  PET CT 11/11/23: Study Result  Narrative & Impression      Large reversible  perfusion defect in apical to basal inferior/inferoseptal/inferolateral walls, consistent with ischemia.  Myocardial blood flow reserve is reduced globally and in each coronary distribution.  In addition, high risk findings are present, including TID (1.16) and drop in EF with stress, concerning for severe multivessel disease.  Overall, study is high risk and cardiac catheterization is recommended   LV perfusion is abnormal. There is evidence of ischemia. Defect 1: There is a large defect with moderate reduction in uptake present in the apical to basal inferior, inferolateral and inferoseptal location(s) that is reversible. There is normal wall motion in the defect area. Consistent with ischemia.   Rest left ventricular function is normal. Rest EF: 55%. Stress left ventricular function is normal. Stress EF: 50%. End diastolic cavity size is normal. End systolic cavity size is normal.   Myocardial blood flow was computed to be 0.60ml/g/min at rest and 1.32ml/g/min at stress. Global myocardial blood flow reserve was 1.47 and was abnormal.   Coronary  calcium  was present on the attenuation correction CT images. Moderate coronary calcifications were present. Coronary calcifications were present in the left anterior descending artery, left circumflex artery and right coronary artery distribution(s).   Findings are consistent with ischemia. The study is high risk.   Electronically signed by Lonni Nanas, MD   Cardiac cath 11/26/23:  LEFT HEART CATH AND CORONARY ANGIOGRAPHY   Conclusion  Coronary angiography 09/26/2023: LM: No significant disease LAD: Prox-mid 20-30% disease Lcx: Prox Lcx, OM1 30% disease RCA: Mid CTO after RV marginal branch           Minimal right-to-right, robust left-to--right collaterals from LAD, both septal and epicardial   LVEDP 22 mmHg      Recommend uptitration of ant anginal therapy and refer to Dr. Swaziland for CTO revascularization.   Newman JINNY Lawrence, MD      PCI 01/01/24: Unsuccessful CTO of the RCA using antegrade and retrograde approaches. Unable to cross with wire.   Plan: will add Ranexa  to medical therapy. Options include referral to another CTO operator or consideration of CABG. Will discuss with patient  Recent Labs: 10/17/2023: Magnesium  2.2 11/19/2023: ALT 24 01/02/2024: BUN 16; Creatinine, Ser 0.92; Hemoglobin 14.0; Platelets 229; Potassium 3.7; Sodium 137  Recent Lipid Panel    Component Value Date/Time   CHOL 131 09/05/2022 1244   TRIG 108 09/05/2022 1244   HDL 37 (L) 09/05/2022 1244   CHOLHDL 5.0 10/02/2020 0235   VLDL 17 10/02/2020 0235   LDLCALC 74 09/05/2022 1244   LDLDIRECT 72 09/05/2022 1244     Risk Assessment/Calculations:     Physical Exam:    VS:  BP 103/63 (BP Location: Left Arm)   Pulse 63   Ht 6' (1.829 m)   Wt 182 lb 12.8 oz (82.9 kg)   SpO2 96%   BMI 24.79 kg/m     Wt Readings from Last 3 Encounters:  01/15/24 182 lb 12.8 oz (82.9 kg)  01/01/24 185 lb (83.9 kg)  12/04/23 185 lb 3.2 oz (84 kg)     GEN:  Well nourished, well developed in no acute  distress HEENT: Normal NECK: No JVD; No carotid bruits LYMPHATICS: No lymphadenopathy CARDIAC: RRR, no murmurs, rubs, gallops RESPIRATORY:  Clear to auscultation without rales, wheezing or rhonchi  ABDOMEN: Soft, non-tender, non-distended MUSCULOSKELETAL:  No edema; small knot in left groin. Moderate bilateral groin bruising. No bruit.  SKIN: Warm and dry NEUROLOGIC:  Alert and oriented x 3 PSYCHIATRIC:  Normal affect   ASSESSMENT:  1. Coronary artery disease of native artery of native heart with stable angina pectoris (HCC)   2. Type 2 diabetes mellitus with hyperglycemia, without long-term current use of insulin (HCC)   3. Pure hypercholesterolemia     PLAN:    In order of problems listed above:  CAD with CTO of the mid RCA with left to right collaterals. RCA is a large dominant vessel. It was patent in 2022. He has symptoms refractory to medical therapy. He has no significant stenosis in the left coronary system. Unfortunately unable to revascularize with CTO PCI. Patient states he cannot live this way. Referred to CT surgery to consider CABG of RCA. Seeing Dr Shyrl next week.      Medication Adjustments/Labs and Tests Ordered: Current medicines are reviewed at length with the patient today.  Concerns regarding medicines are outlined above.  No orders of the defined types were placed in this encounter.  Meds ordered this encounter  Medications   nitroGLYCERIN  (NITROSTAT ) 0.4 MG SL tablet    Sig: Place 1 tablet (0.4 mg total) under the tongue every 5 (five) minutes as needed for chest pain.    Dispense:  30 tablet    Refill:  6    Patient Instructions  Medication Instructions:  Continue same medications *If you need a refill on your cardiac medications before your next appointment, please call your pharmacy*  Lab Work: None ordered  Testing/Procedures: None ordered  Follow-Up: At [facility], you and your health needs are our priority.  As part of  our continuing mission to provide you with exceptional heart care, our providers are all part of one team.  This team includes your primary Cardiologist (physician) and Advanced Practice Providers or APPs (Physician Assistants and Nurse Practitioners) who all work together to provide you with the care you need, when you need it.  Your next appointment:  3 months      Monday 10/6 at 1:40 pm    Provider:  Dr.Bocephus Cali   We recommend signing up for the patient portal called MyChart.  Sign up information is provided on this After Visit Summary.  MyChart is used to connect with patients for Virtual Visits (Telemedicine).  Patients are able to view lab/test results, encounter notes, upcoming appointments, etc.  Non-urgent messages can be sent to your provider as well.   To learn more about what you can do with MyChart, go to [email or web address].       Signed, Kalia Vahey Swaziland, MD  01/15/2024 1:37 PM    [facility] v

## 2024-01-15 ENCOUNTER — Ambulatory Visit: Attending: Cardiology | Admitting: Cardiology

## 2024-01-15 ENCOUNTER — Encounter: Payer: Self-pay | Admitting: Cardiology

## 2024-01-15 VITALS — BP 103/63 | HR 63 | Ht 72.0 in | Wt 182.8 lb

## 2024-01-15 DIAGNOSIS — I25118 Atherosclerotic heart disease of native coronary artery with other forms of angina pectoris: Secondary | ICD-10-CM

## 2024-01-15 DIAGNOSIS — E1165 Type 2 diabetes mellitus with hyperglycemia: Secondary | ICD-10-CM

## 2024-01-15 DIAGNOSIS — E7800 Pure hypercholesterolemia, unspecified: Secondary | ICD-10-CM

## 2024-01-15 MED ORDER — NITROGLYCERIN 0.4 MG SL SUBL: 0.4000 mg | SUBLINGUAL_TABLET | SUBLINGUAL | 6 refills | Status: DC | PRN

## 2024-01-15 NOTE — Patient Instructions (Addendum)
 Medication Instructions:  Continue same medications *If you need a refill on your cardiac medications before your next appointment, please call your pharmacy*  Lab Work: None ordered  Testing/Procedures: None ordered  Follow-Up: At [facility], you and your health needs are our priority.  As part of our continuing mission to provide you with exceptional heart care, our providers are all part of one team.  This team includes your primary Cardiologist (physician) and Advanced Practice Providers or APPs (Physician Assistants and Nurse Practitioners) who all work together to provide you with the care you need, when you need it.  Your next appointment:  3 months      Monday 10/6 at 1:40 pm    Provider:  Dr.Jordan   We recommend signing up for the patient portal called MyChart.  Sign up information is provided on this After Visit Summary.  MyChart is used to connect with patients for Virtual Visits (Telemedicine).  Patients are able to view lab/test results, encounter notes, upcoming appointments, etc.  Non-urgent messages can be sent to your provider as well.   To learn more about what you can do with MyChart, go to [email or web address].

## 2024-01-21 NOTE — Progress Notes (Signed)
 [address]       [name] [address]             [phone]        Phillip Clark [facility] Medical Record #[id number] Date of Birth: Jan 22, 1966  Referring: Swaziland, Peter M, MD Primary Care: [name], MD Primary Cardiologist:Sunit Michele, DO  Chief Complaint:    Chief Complaint  Patient presents with   Coronary Artery Disease    New patient consultation, CATH 5/14    History of Present Illness:     Phillip Clark is a 58 y.o. male who presents for surgical evaluation of single-vessel coronary artery disease.  He underwent attempted PCI of CTO lesion to the RCA, but this was unsuccessful.  The patient has been symptomatic for over 2 years at this point.  He admits to chest pain and shortness of breath which has been progressively worsening.     Past Medical and Surgical History: Previous Chest Surgery: No Previous Chest Radiation: No Diabetes Mellitus: Yes.  HbA1C 7.3 Creatinine:  Lab Results  Component Value Date   CREATININE 0.92 01/02/2024   CREATININE 1.15 12/25/2023   CREATININE 0.97 11/19/2023     Past Medical History:  Diagnosis Date   Coronary artery calcification of native artery    Essential tremor    Hyperlipidemia    Hypertension    Nonobstructive atherosclerosis of coronary artery     Past Surgical History:  Procedure Laterality Date   CORONARY CTO INTERVENTION N/A 01/01/2024   Procedure: CORONARY CTO INTERVENTION;  Surgeon: Swaziland, Peter M, MD;  Location: [facility] INVASIVE CV LAB;  Service: Cardiovascular;  Laterality: N/A;   HERNIA REPAIR     LEFT HEART CATH AND CORONARY ANGIOGRAPHY N/A 10/02/2020   Procedure: LEFT HEART CATH AND CORONARY ANGIOGRAPHY;  Surgeon: Phillip Clark PARAS, MD;  Location: [facility] INVASIVE CV LAB;  Service: Cardiovascular;  Laterality: N/A;   LEFT HEART CATH AND CORONARY ANGIOGRAPHY N/A 11/26/2023   Procedure: LEFT HEART CATH AND CORONARY ANGIOGRAPHY;  Surgeon: Phillip Clark PARAS, MD;  Location: [facility]  INVASIVE CV LAB;  Service: Cardiovascular;  Laterality: N/A;   VASECTOMY      Social History:  Social History   Tobacco Use  Smoking Status Every Day   Current packs/day: 0.00   Average packs/day: 0.5 packs/day for 34.0 years (17.0 ttl pk-yrs)   Types: Cigarettes   Start date: 33   Last attempt to quit: 2018   Years since quitting: 7.5  Smokeless Tobacco Never    Social History   Substance and Sexual Activity  Alcohol Use Not Currently     Allergies  Allergen Reactions   Latex Rash    Severe   Crestor [Rosuvastatin Calcium ] Other (See Comments)    MYALGIAS   Rosuvastatin Other (See Comments)    Other reaction(s): Myalgias (intolerance)   Other Other (See Comments)    Steroid/Caused blood Glucose to increase   Topiramate Other (See Comments)    Tingling legs    Varenicline Other (See Comments)    Suicidal idea    Tape Rash    takes skins off      Current Outpatient Medications  Medication Sig Dispense Refill   aspirin  EC 81 MG tablet Take 1 tablet (81 mg total) by mouth daily. Swallow whole. 90 tablet 3   atorvastatin  (LIPITOR) 40 MG tablet TAKE 1 TABLET BY MOUTH AT BEDTIME 90 tablet 3   ezetimibe  (ZETIA ) 10 MG tablet Take 1  tablet by mouth once daily 90 tablet 3   isosorbide  mononitrate (IMDUR ) 30 MG 24 hr tablet Take 1 tablet (30 mg total) by mouth 2 (two) times daily. 60 tablet 11   JARDIANCE  25 MG TABS tablet Take 25 mg by mouth daily.     magnesium  oxide (MAG-OX) 400 MG tablet Take 400 mg by mouth daily.     metoprolol  succinate (TOPROL -XL) 100 MG 24 hr tablet Take 1 tablet (100 mg total) by mouth daily. WITH OR IMMEDIATELY FOLLOWING A MEAL 90 tablet 3   Multiple Vitamin (MULTIVITAMIN ADULT PO) Take 1 tablet by mouth daily.     nitroGLYCERIN  (NITROSTAT ) 0.4 MG SL tablet Place 1 tablet (0.4 mg total) under the tongue every 5 (five) minutes as needed for chest pain. 30 tablet 6   Omega-3 Fatty Acids (FISH OIL) 1000 MG CAPS Take 1,000 mg by mouth daily.      ranolazine  (RANEXA ) 500 MG 12 hr tablet Take 1 tablet (500 mg total) by mouth 2 (two) times daily. 60 tablet 6   No current facility-administered medications for this visit.    (Not in a hospital admission)   Family History  Problem Relation Age of Onset   Hypertension Mother    Hyperlipidemia Mother    Diabetes Mother    Thyroid disease Mother    Heart attack Father    Hypertension Father    Hyperlipidemia Father    Hypertension Sister    Hyperlipidemia Sister    Hyperlipidemia Sister    Hyperlipidemia Sister    Hyperlipidemia Sister    Hypertension Brother    Hyperlipidemia Brother      Review of Systems:   Review of Systems  Constitutional:  Positive for malaise/fatigue. Negative for weight loss.  Respiratory:  Positive for shortness of breath.   Cardiovascular:  Positive for chest pain.  Neurological: Negative.       Physical Exam: BP 110/68 (BP Location: Right Arm, Patient Position: Sitting, Cuff Size: Normal)   Pulse 68   Resp 20   Ht 6' (1.829 Clark)   Wt 190 lb (86.2 kg)   SpO2 96% Comment: RA  BMI 25.77 kg/Clark  Physical Exam Constitutional:      General: He is not in acute distress.    Appearance: He is not ill-appearing.  HENT:     Head: Normocephalic and atraumatic.  Eyes:     Extraocular Movements: Extraocular movements intact.  Cardiovascular:     Rate and Rhythm: Normal rate.  Pulmonary:     Effort: Pulmonary effort is normal. No respiratory distress.  Abdominal:     General: Abdomen is flat. There is no distension.  Musculoskeletal:        General: Normal range of motion.     Cervical back: Normal range of motion.  Skin:    General: Skin is warm and dry.  Neurological:     General: No focal deficit present.     Mental Status: He is alert and oriented to person, place, and time.       Diagnostic Studies & Laboratory data:    Left Heart Catherization:    Echo: IMPRESSIONS     1. Left ventricular ejection fraction, by  estimation, is 60 to 65%. The  left ventricle has normal function. The left ventricle has no regional  wall motion abnormalities. Left ventricular diastolic parameters were  normal.   2. Right ventricular systolic function is normal. The right ventricular  size is normal.   3. The mitral valve  is normal in structure. No evidence of mitral valve  regurgitation. No evidence of mitral stenosis.   4. The aortic valve is normal in structure. Aortic valve regurgitation is  not visualized. No aortic stenosis is present.   5. There is mild dilatation of the aortic root, measuring 40 mm.   6. The inferior vena cava is normal in size with greater than 50%  respiratory variability, suggesting right atrial pressure of 3 mmHg.    EKG: Sinus with PVCs I have independently reviewed the above radiologic studies and discussed with the patient   Recent Lab Findings: Lab Results  Component Value Date   WBC 7.7 01/02/2024   HGB 14.0 01/02/2024   HCT 41.6 01/02/2024   PLT 229 01/02/2024   GLUCOSE 114 (H) 01/02/2024   CHOL 131 09/05/2022   TRIG 108 09/05/2022   HDL 37 (L) 09/05/2022   LDLDIRECT 72 09/05/2022   LDLCALC 74 09/05/2022   ALT 24 11/19/2023   AST 30 11/19/2023   NA 137 01/02/2024   K 3.7 01/02/2024   CL 104 01/02/2024   CREATININE 0.92 01/02/2024   BUN 16 01/02/2024   CO2 23 01/02/2024   INR 1.0 10/01/2020   HGBA1C 7.3 (H) 02/09/2021      Assessment / Plan:   58 y.o. male with CTO RCA lesion.  This does fill via left-to-right collaterals.  We discussed the risks and benefits of an off-pump CABG with harvesting of the left radial artery along with a small piece of vein to anastomosis to the aorta.  He has agreed to proceed.  He will need a repeat echo.     I  spent 30 minutes counseling the patient face to face.   Linnie MALVA Rayas 01/23/2024 3:33 PM

## 2024-01-21 NOTE — H&P (View-Only) (Signed)
 [address]       [name] [address]             [phone]        Phillip Clark [facility] Medical Record #[id number] Date of Birth: Jan 22, 1966  Referring: [place], Peter M, MD Primary Care: [name], MD Primary Cardiologist:Sunit Michele, DO  Chief Complaint:    Chief Complaint  Patient presents with   Coronary Artery Disease    New patient consultation, CATH 5/14    History of Present Illness:     Phillip Clark is a 58 y.o. male who presents for surgical evaluation of single-vessel coronary artery disease.  He underwent attempted PCI of CTO lesion to the RCA, but this was unsuccessful.  The patient has been symptomatic for over 2 years at this point.  He admits to chest pain and shortness of breath which has been progressively worsening.     Past Medical and Surgical History: Previous Chest Surgery: No Previous Chest Radiation: No Diabetes Mellitus: Yes.  HbA1C 7.3 Creatinine:  Lab Results  Component Value Date   CREATININE 0.92 01/02/2024   CREATININE 1.15 12/25/2023   CREATININE 0.97 11/19/2023     Past Medical History:  Diagnosis Date   Coronary artery calcification of native artery    Essential tremor    Hyperlipidemia    Hypertension    Nonobstructive atherosclerosis of coronary artery     Past Surgical History:  Procedure Laterality Date   CORONARY CTO INTERVENTION N/A 01/01/2024   Procedure: CORONARY CTO INTERVENTION;  Surgeon: Swaziland, Peter M, MD;  Location: [facility] INVASIVE CV LAB;  Service: Cardiovascular;  Laterality: N/A;   HERNIA REPAIR     LEFT HEART CATH AND CORONARY ANGIOGRAPHY N/A 10/02/2020   Procedure: LEFT HEART CATH AND CORONARY ANGIOGRAPHY;  Surgeon: [name], MD;  Location: [facility] INVASIVE CV LAB;  Service: Cardiovascular;  Laterality: N/A;   LEFT HEART CATH AND CORONARY ANGIOGRAPHY N/A 11/26/2023   Procedure: LEFT HEART CATH AND CORONARY ANGIOGRAPHY;  Surgeon: [name], MD;  Location: [facility]  INVASIVE CV LAB;  Service: Cardiovascular;  Laterality: N/A;   VASECTOMY      Social History:  Social History   Tobacco Use  Smoking Status Every Day   Current packs/day: 0.00   Average packs/day: 0.5 packs/day for 34.0 years (17.0 ttl pk-yrs)   Types: Cigarettes   Start date: 33   Last attempt to quit: 2018   Years since quitting: 7.5  Smokeless Tobacco Never    Social History   Substance and Sexual Activity  Alcohol Use Not Currently     Allergies  Allergen Reactions   Latex Rash    Severe   Crestor [Rosuvastatin Calcium ] Other (See Comments)    MYALGIAS   Rosuvastatin Other (See Comments)    Other reaction(s): Myalgias (intolerance)   Other Other (See Comments)    Steroid/Caused blood Glucose to increase   Topiramate Other (See Comments)    Tingling legs    Varenicline Other (See Comments)    Suicidal idea    Tape Rash    takes skins off      Current Outpatient Medications  Medication Sig Dispense Refill   aspirin  EC 81 MG tablet Take 1 tablet (81 mg total) by mouth daily. Swallow whole. 90 tablet 3   atorvastatin  (LIPITOR) 40 MG tablet TAKE 1 TABLET BY MOUTH AT BEDTIME 90 tablet 3   ezetimibe  (ZETIA ) 10 MG tablet Take 1  tablet by mouth once daily 90 tablet 3   isosorbide  mononitrate (IMDUR ) 30 MG 24 hr tablet Take 1 tablet (30 mg total) by mouth 2 (two) times daily. 60 tablet 11   JARDIANCE  25 MG TABS tablet Take 25 mg by mouth daily.     magnesium  oxide (MAG-OX) 400 MG tablet Take 400 mg by mouth daily.     metoprolol  succinate (TOPROL -XL) 100 MG 24 hr tablet Take 1 tablet (100 mg total) by mouth daily. WITH OR IMMEDIATELY FOLLOWING A MEAL 90 tablet 3   Multiple Vitamin (MULTIVITAMIN ADULT PO) Take 1 tablet by mouth daily.     nitroGLYCERIN  (NITROSTAT ) 0.4 MG SL tablet Place 1 tablet (0.4 mg total) under the tongue every 5 (five) minutes as needed for chest pain. 30 tablet 6   Omega-3 Fatty Acids (FISH OIL) 1000 MG CAPS Take 1,000 mg by mouth daily.      ranolazine  (RANEXA ) 500 MG 12 hr tablet Take 1 tablet (500 mg total) by mouth 2 (two) times daily. 60 tablet 6   No current facility-administered medications for this visit.    (Not in a hospital admission)   Family History  Problem Relation Age of Onset   Hypertension Mother    Hyperlipidemia Mother    Diabetes Mother    Thyroid disease Mother    Heart attack Father    Hypertension Father    Hyperlipidemia Father    Hypertension Sister    Hyperlipidemia Sister    Hyperlipidemia Sister    Hyperlipidemia Sister    Hyperlipidemia Sister    Hypertension Brother    Hyperlipidemia Brother      Review of Systems:   Review of Systems  Constitutional:  Positive for malaise/fatigue. Negative for weight loss.  Respiratory:  Positive for shortness of breath.   Cardiovascular:  Positive for chest pain.  Neurological: Negative.       Physical Exam: BP 110/68 (BP Location: Right Arm, Patient Position: Sitting, Cuff Size: Normal)   Pulse 68   Resp 20   Ht 6' (1.829 m)   Wt 190 lb (86.2 kg)   SpO2 96% Comment: RA  BMI 25.77 kg/m  Physical Exam Constitutional:      General: He is not in acute distress.    Appearance: He is not ill-appearing.  HENT:     Head: Normocephalic and atraumatic.  Eyes:     Extraocular Movements: Extraocular movements intact.  Cardiovascular:     Rate and Rhythm: Normal rate.  Pulmonary:     Effort: Pulmonary effort is normal. No respiratory distress.  Abdominal:     General: Abdomen is flat. There is no distension.  Musculoskeletal:        General: Normal range of motion.     Cervical back: Normal range of motion.  Skin:    General: Skin is warm and dry.  Neurological:     General: No focal deficit present.     Mental Status: He is alert and oriented to person, place, and time.       Diagnostic Studies & Laboratory data:    Left Heart Catherization:    Echo: IMPRESSIONS     1. Left ventricular ejection fraction, by  estimation, is 60 to 65%. The  left ventricle has normal function. The left ventricle has no regional  wall motion abnormalities. Left ventricular diastolic parameters were  normal.   2. Right ventricular systolic function is normal. The right ventricular  size is normal.   3. The mitral valve  is normal in structure. No evidence of mitral valve  regurgitation. No evidence of mitral stenosis.   4. The aortic valve is normal in structure. Aortic valve regurgitation is  not visualized. No aortic stenosis is present.   5. There is mild dilatation of the aortic root, measuring 40 mm.   6. The inferior vena cava is normal in size with greater than 50%  respiratory variability, suggesting right atrial pressure of 3 mmHg.    EKG: Sinus with PVCs I have independently reviewed the above radiologic studies and discussed with the patient   Recent Lab Findings: Lab Results  Component Value Date   WBC 7.7 01/02/2024   HGB 14.0 01/02/2024   HCT 41.6 01/02/2024   PLT 229 01/02/2024   GLUCOSE 114 (H) 01/02/2024   CHOL 131 09/05/2022   TRIG 108 09/05/2022   HDL 37 (L) 09/05/2022   LDLDIRECT 72 09/05/2022   LDLCALC 74 09/05/2022   ALT 24 11/19/2023   AST 30 11/19/2023   NA 137 01/02/2024   K 3.7 01/02/2024   CL 104 01/02/2024   CREATININE 0.92 01/02/2024   BUN 16 01/02/2024   CO2 23 01/02/2024   INR 1.0 10/01/2020   HGBA1C 7.3 (H) 02/09/2021      Assessment / Plan:   58 y.o. male with CTO RCA lesion.  This does fill via left-to-right collaterals.  We discussed the risks and benefits of an off-pump CABG with harvesting of the left radial artery along with a small piece of vein to anastomosis to the aorta.  He has agreed to proceed.  He will need a repeat echo.     I  spent 30 minutes counseling the patient face to face.   Linnie MALVA Rayas 01/23/2024 3:33 PM

## 2024-01-23 ENCOUNTER — Ambulatory Visit
Attending: Thoracic Surgery (Cardiothoracic Vascular Surgery) | Admitting: Thoracic Surgery (Cardiothoracic Vascular Surgery)

## 2024-01-23 ENCOUNTER — Encounter: Payer: Self-pay | Admitting: Thoracic Surgery (Cardiothoracic Vascular Surgery)

## 2024-01-23 ENCOUNTER — Encounter: Payer: Self-pay | Admitting: *Deleted

## 2024-01-23 ENCOUNTER — Other Ambulatory Visit: Payer: Self-pay | Admitting: *Deleted

## 2024-01-23 VITALS — BP 110/68 | HR 68 | Resp 20 | Ht 72.0 in | Wt 190.0 lb

## 2024-01-23 DIAGNOSIS — I2510 Atherosclerotic heart disease of native coronary artery without angina pectoris: Secondary | ICD-10-CM | POA: Diagnosis not present

## 2024-01-27 NOTE — Pre-Procedure Instructions (Signed)
 Surgical Instructions   Your procedure is scheduled on January 29, 2024. Report to [facility] Main Entrance A at 5:30 A.M., then check in with the Admitting office. Any questions or running late day of surgery: call [phone]  Questions prior to your surgery date: call [phone], Monday-Friday, 8am-4pm. If you experience any cold or flu symptoms such as cough, fever, chills, shortness of breath, etc. between now and your scheduled surgery, please notify us  at the above number.     Remember:  Do not eat or drink after midnight the night before your surgery   Take these medicines the morning of surgery with A SIP OF WATER: ezetimibe  (ZETIA )  isosorbide  mononitrate (IMDUR )  metoprolol  succinate (TOPROL -XL)  ranolazine  (RANEXA )    May take these medicines IF NEEDED: nitroGLYCERIN  (NITROSTAT )    STOP taking your JARDIANCE  three days prior to surgery. Last dose will be July 13th.  Continue taking your Aspirin  through the day before surgery. DO NOT take any the morning of surgery.   One week prior to surgery, STOP taking any Aleve , Naproxen , Ibuprofen, Motrin, Advil, Goody's, BC's, all herbal medications, fish oil, and non-prescription vitamins.                     Do NOT Smoke (Tobacco/Vaping) for 24 hours prior to your procedure.  If you use a CPAP at night, you may bring your mask/headgear for your overnight stay.   You will be asked to remove any contacts, glasses, piercing's, hearing aid's, dentures/partials prior to surgery. Please bring cases for these items if needed.    Patients discharged the day of surgery will not be allowed to drive home, and someone needs to stay with them for 24 hours.  SURGICAL WAITING ROOM VISITATION Patients may have no more than 2 support people in the waiting area - these visitors may rotate.   Pre-op nurse will coordinate an appropriate time for 1 ADULT support person, who may not rotate, to accompany patient in pre-op.  Children under  the age of 66 must have an adult with them who is not the patient and must remain in the main waiting area with an adult.  If the patient needs to stay at the hospital during part of their recovery, the visitor guidelines for inpatient rooms apply.  Please refer to the [facility] website for the visitor guidelines for any additional information.   If you received a COVID test during your pre-op visit  it is requested that you wear a mask when out in public, stay away from anyone that may not be feeling well and notify your surgeon if you develop symptoms. If you have been in contact with anyone that has tested positive in the last 10 days please notify you surgeon.      Pre-operative CHG Bathing Instructions   You can play a key role in reducing the risk of infection after surgery. Your skin needs to be as free of germs as possible. You can reduce the number of germs on your skin by washing with CHG (chlorhexidine  gluconate) soap before surgery. CHG is an antiseptic soap that kills germs and continues to kill germs even after washing.   DO NOT use if you have an allergy to chlorhexidine /CHG or antibacterial soaps. If your skin becomes reddened or irritated, stop using the CHG and notify one of our RNs at [phone].              TAKE A SHOWER THE NIGHT BEFORE SURGERY  AND THE DAY OF SURGERY    Please keep in mind the following:  DO NOT shave, including legs and underarms, 48 hours prior to surgery.   You may shave your face before/day of surgery.  Place clean sheets on your bed the night before surgery Use a clean washcloth (not used since being washed) for each shower. DO NOT sleep with pet's night before surgery.  CHG Shower Instructions:  Wash your face and private area with normal soap. If you choose to wash your hair, wash first with your normal shampoo.  After you use shampoo/soap, rinse your hair and body thoroughly to remove shampoo/soap residue.  Turn the water OFF and apply  half the bottle of CHG soap to a CLEAN washcloth.  Apply CHG soap ONLY FROM YOUR NECK DOWN TO YOUR TOES (washing for 3-5 minutes)  DO NOT use CHG soap on face, private areas, open wounds, or sores.  Pay special attention to the area where your surgery is being performed.  If you are having back surgery, having someone wash your back for you may be helpful. Wait 2 minutes after CHG soap is applied, then you may rinse off the CHG soap.  Pat dry with a clean towel  Put on clean pajamas    Additional instructions for the day of surgery: DO NOT APPLY any lotions, deodorants, cologne, or perfumes.   Do not wear jewelry or makeup Do not wear nail polish, gel polish, artificial nails, or any other type of covering on natural nails (fingers and toes) Do not bring valuables to the hospital. [facility] is not responsible for valuables/personal belongings. Put on clean/comfortable clothes.  Please brush your teeth.  Ask your nurse before applying any prescription medications to the skin.

## 2024-01-28 ENCOUNTER — Other Ambulatory Visit: Payer: Self-pay

## 2024-01-28 ENCOUNTER — Ambulatory Visit (HOSPITAL_BASED_OUTPATIENT_CLINIC_OR_DEPARTMENT_OTHER)
Admission: RE | Admit: 2024-01-28 | Discharge: 2024-01-28 | Disposition: A | Discharge status: Home / Self Care | Source: Ambulatory Visit | Attending: Thoracic Surgery (Cardiothoracic Vascular Surgery) | Admitting: Thoracic Surgery (Cardiothoracic Vascular Surgery)

## 2024-01-28 ENCOUNTER — Encounter (HOSPITAL_COMMUNITY)
Admission: RE | Admit: 2024-01-28 | Discharge: 2024-01-28 | Disposition: A | Discharge status: Home / Self Care | Source: Ambulatory Visit | Attending: Thoracic Surgery (Cardiothoracic Vascular Surgery) | Admitting: Thoracic Surgery (Cardiothoracic Vascular Surgery)

## 2024-01-28 ENCOUNTER — Encounter (HOSPITAL_COMMUNITY): Payer: Self-pay

## 2024-01-28 ENCOUNTER — Ambulatory Visit (HOSPITAL_COMMUNITY)
Admission: RE | Admit: 2024-01-28 | Discharge: 2024-01-28 | Disposition: A | Discharge status: Home / Self Care | Source: Ambulatory Visit | Attending: Thoracic Surgery (Cardiothoracic Vascular Surgery) | Admitting: Thoracic Surgery (Cardiothoracic Vascular Surgery)

## 2024-01-28 VITALS — BP 112/69 | HR 67 | Temp 98.5°F | Resp 19 | Ht 72.0 in | Wt 188.0 lb

## 2024-01-28 DIAGNOSIS — I2510 Atherosclerotic heart disease of native coronary artery without angina pectoris: Secondary | ICD-10-CM

## 2024-01-28 DIAGNOSIS — Z01818 Encounter for other preprocedural examination: Secondary | ICD-10-CM | POA: Insufficient documentation

## 2024-01-28 HISTORY — DX: Pneumonia, unspecified organism: J18.9

## 2024-01-28 HISTORY — DX: Type 2 diabetes mellitus without complications: E11.9

## 2024-01-28 LAB — CBC
HCT: 47 % (ref 39.0–52.0)
Hemoglobin: 16 g/dL (ref 13.0–17.0)
MCH: 31.7 pg (ref 26.0–34.0)
MCHC: 34 g/dL (ref 30.0–36.0)
MCV: 93.3 fL (ref 80.0–100.0)
Platelets: 255 K/uL (ref 150–400)
RBC: 5.04 MIL/uL (ref 4.22–5.81)
RDW: 13.2 % (ref 11.5–15.5)
WBC: 9.2 K/uL (ref 4.0–10.5)
nRBC: 0 % (ref 0.0–0.2)

## 2024-01-28 LAB — TYPE AND SCREEN
ABO/RH(D): B POS
Antibody Screen: NEGATIVE

## 2024-01-28 LAB — URINALYSIS, ROUTINE W REFLEX MICROSCOPIC
Bacteria, UA: NONE SEEN
Bilirubin Urine: NEGATIVE
Glucose, UA: NEGATIVE mg/dL
Ketones, ur: NEGATIVE mg/dL
Leukocytes,Ua: NEGATIVE
Nitrite: NEGATIVE
Protein, ur: NEGATIVE mg/dL
Specific Gravity, Urine: 1.005 (ref 1.005–1.030)
pH: 6 (ref 5.0–8.0)

## 2024-01-28 LAB — COMPREHENSIVE METABOLIC PANEL WITH GFR
ALT: 27 U/L (ref 0–44)
AST: 24 U/L (ref 15–41)
Albumin: 4.2 g/dL (ref 3.5–5.0)
Alkaline Phosphatase: 69 U/L (ref 38–126)
Anion gap: 10 (ref 5–15)
BUN: 24 mg/dL — ABNORMAL HIGH (ref 6–20)
CO2: 27 mmol/L (ref 22–32)
Calcium: 9.4 mg/dL (ref 8.9–10.3)
Chloride: 101 mmol/L (ref 98–111)
Creatinine, Ser: 1.14 mg/dL (ref 0.61–1.24)
GFR, Estimated: >60 mL/min (ref >60)
Glucose, Bld: 93 mg/dL (ref 70–99)
Potassium: 4.3 mmol/L (ref 3.5–5.1)
Sodium: 138 mmol/L (ref 135–145)
Total Bilirubin: 0.7 mg/dL (ref 0.0–1.2)
Total Protein: 7.2 g/dL (ref 6.5–8.1)

## 2024-01-28 LAB — ECHOCARDIOGRAM COMPLETE
Area-P 1/2: 3.23 cm2
Height: 72 in
S' Lateral: 3.6 cm
Weight: 3008 [oz_av]

## 2024-01-28 LAB — PROTIME-INR
INR: 1 (ref 0.8–1.2)
Prothrombin Time: 14.1 s (ref 11.4–15.2)

## 2024-01-28 LAB — GLUCOSE, CAPILLARY: Glucose-Capillary: 93 mg/dL (ref 70–99)

## 2024-01-28 LAB — HEMOGLOBIN A1C
Hgb A1c MFr Bld: 5.5 % (ref 4.8–5.6)
Mean Plasma Glucose: 111.15 mg/dL

## 2024-01-28 LAB — SURGICAL PCR SCREEN
MRSA, PCR: NEGATIVE
Staphylococcus aureus: POSITIVE — AB

## 2024-01-28 LAB — APTT: aPTT: 33 s (ref 24–36)

## 2024-01-28 MED ORDER — NITROGLYCERIN IN D5W 200-5 MCG/ML-% IV SOLN
2.0000 ug/min | INTRAVENOUS | Status: DC
  Filled 2024-01-28: qty 250

## 2024-01-28 MED ORDER — HEPARIN 30,000 UNITS/1000 ML (OHS) CELLSAVER SOLUTION
Status: DC
  Filled 2024-01-28: qty 1000

## 2024-01-28 MED ORDER — PHENYLEPHRINE HCL-NACL 20-0.9 MG/250ML-% IV SOLN
30.0000 ug/min | INTRAVENOUS | Status: AC
  Administered 2024-01-29: 15 ug/min via INTRAVENOUS
  Filled 2024-01-28: qty 250

## 2024-01-28 MED ORDER — CEFAZOLIN SODIUM-DEXTROSE 2-4 GM/100ML-% IV SOLN
2.0000 g | INTRAVENOUS | Status: DC
  Filled 2024-01-28: qty 100

## 2024-01-28 MED ORDER — POTASSIUM CHLORIDE 2 MEQ/ML IV SOLN
80.0000 meq | INTRAVENOUS | Status: DC
  Filled 2024-01-28: qty 40

## 2024-01-28 MED ORDER — MANNITOL 20 % IV SOLN
INTRAVENOUS | Status: DC
  Filled 2024-01-28: qty 13

## 2024-01-28 MED ORDER — TRANEXAMIC ACID (OHS) PUMP PRIME SOLUTION
2.0000 mg/kg | INTRAVENOUS | Status: DC
  Filled 2024-01-28: qty 1.71

## 2024-01-28 MED ORDER — MILRINONE LACTATE IN DEXTROSE 20-5 MG/100ML-% IV SOLN
0.3000 ug/kg/min | INTRAVENOUS | Status: DC
  Filled 2024-01-28: qty 100

## 2024-01-28 MED ORDER — CEFAZOLIN SODIUM-DEXTROSE 2-4 GM/100ML-% IV SOLN
2.0000 g | INTRAVENOUS | Status: AC
  Administered 2024-01-29: 2 g via INTRAVENOUS
  Filled 2024-01-28: qty 100

## 2024-01-28 MED ORDER — INSULIN REGULAR(HUMAN) IN NACL 100-0.9 UT/100ML-% IV SOLN
INTRAVENOUS | Status: AC
  Administered 2024-01-29: 2.4 [IU]/h via INTRAVENOUS
  Filled 2024-01-28: qty 100

## 2024-01-28 MED ORDER — DEXMEDETOMIDINE HCL IN NACL 400 MCG/100ML IV SOLN
0.1000 ug/kg/h | INTRAVENOUS | Status: AC
  Administered 2024-01-29: .7 ug/kg/h via INTRAVENOUS
  Filled 2024-01-28: qty 100

## 2024-01-28 MED ORDER — TRANEXAMIC ACID 1000 MG/10ML IV SOLN
1.5000 mg/kg/h | INTRAVENOUS | Status: AC
  Administered 2024-01-29: 1.5 mg/kg/h via INTRAVENOUS
  Filled 2024-01-28: qty 25

## 2024-01-28 MED ORDER — VANCOMYCIN HCL 1.5 G IV SOLR
1500.0000 mg | INTRAVENOUS | Status: AC
  Administered 2024-01-29: 1500 mg via INTRAVENOUS
  Filled 2024-01-28: qty 30

## 2024-01-28 MED ORDER — PLASMA-LYTE A IV SOLN
INTRAVENOUS | Status: DC
  Filled 2024-01-28: qty 2.5

## 2024-01-28 MED ORDER — EPINEPHRINE HCL 5 MG/250ML IV SOLN IN NS
0.0000 ug/min | INTRAVENOUS | Status: DC
  Filled 2024-01-28: qty 250

## 2024-01-28 MED ORDER — TRANEXAMIC ACID (OHS) BOLUS VIA INFUSION
15.0000 mg/kg | INTRAVENOUS | Status: AC
  Administered 2024-01-29: 1279.5 mg via INTRAVENOUS
  Filled 2024-01-28: qty 1280

## 2024-01-28 MED ORDER — NOREPINEPHRINE 4 MG/250ML-% IV SOLN
0.0000 ug/min | INTRAVENOUS | Status: AC
  Administered 2024-01-29: 2 ug/min via INTRAVENOUS
  Filled 2024-01-28: qty 250

## 2024-01-28 NOTE — Anesthesia Preprocedure Evaluation (Signed)
 Anesthesia Evaluation  Patient identified by MRN, date of birth, ID band Patient awake    Reviewed: Allergy & Precautions, H&P , NPO status , Patient's Chart, lab work & pertinent test results  Airway Mallampati: II  TM Distance: >3 FB Neck ROM: Full    Dental  (+) Missing, Dental Advisory Given   Pulmonary pneumonia, Current Smoker and Patient abstained from smoking.   Pulmonary exam normal breath sounds clear to auscultation       Cardiovascular + angina  + CAD  Normal cardiovascular exam Rhythm:Regular Rate:Normal  Echo 01/2024  1. Left ventricular ejection fraction, by estimation, is 55 to 60%. The left ventricle has normal function. The left ventricle has no regional wall motion abnormalities. Left ventricular diastolic parameters were normal.   2. Right ventricular systolic function is normal. The right ventricular size is normal.   3. The mitral valve is normal in structure. No evidence of mitral valve regurgitation. No evidence of mitral stenosis.   4. The aortic valve is normal in structure. Aortic valve regurgitation is not visualized. No aortic stenosis is present.   5. The inferior vena cava is normal in size with greater than 50% respiratory variability, suggesting right atrial pressure of 3 mmHg.     Neuro/Psych negative neurological ROS  negative psych ROS   GI/Hepatic negative GI ROS, Neg liver ROS,,,  Endo/Other  diabetes    Renal/GU negative Renal ROS     Musculoskeletal negative musculoskeletal ROS (+)    Abdominal   Peds  Hematology negative hematology ROS (+)   Anesthesia Other Findings   Reproductive/Obstetrics                              Anesthesia Physical Anesthesia Plan  ASA: 4  Anesthesia Plan: General   Post-op Pain Management: Ofirmev  IV (intra-op)*   Induction: Intravenous  PONV Risk Score and Plan: 3 and Ondansetron , Dexamethasone, Treatment may vary  due to age or medical condition and Midazolam   Airway Management Planned: Oral ETT  Additional Equipment: Arterial line, TEE, Ultrasound Guidance Line Placement and CVP  Intra-op Plan:   Post-operative Plan: Possible Post-op intubation/ventilation  Informed Consent: I have reviewed the patients History and Physical, chart, labs and discussed the procedure including the risks, benefits and alternatives for the proposed anesthesia with the patient or authorized representative who has indicated his/her understanding and acceptance.     Dental advisory given  Plan Discussed with: CRNA  Anesthesia Plan Comments:          Anesthesia Quick Evaluation

## 2024-01-28 NOTE — Progress Notes (Signed)
 PCP - Dr. Artist Cohens Cardiologist - Dr. Peter swaziland - last office visit 01/15/2024  PPM/ICD - Denies Device Orders - n/a Rep Notified - n/a  Chest x-ray - 01/28/2024 EKG - 01/02/2024 Stress Test - 11/11/2023 ECHO - 01/28/2024 Cardiac Cath - 01/01/2024  Sleep Study - Denies CPAP - n/a  Pt is DM2. He checks his blood sugar 3x/day. Normal fasting range is 95-100. CBG at pre-op appointment 93. A1c result pending.  Last dose of GLP1 agonist-  n/a GLP1 instructions: n/a  Blood Thinner Instructions: n/a Aspirin  Instructions: Pt instructed to continue ASA through the day before surgery and none the morning of surgery  NPO after midnight  COVID TEST- n/a   Anesthesia review: No   Patient denies shortness of breath, fever, cough and chest pain at PAT appointment. Pt denies any respiratory illness/infection in the last two months.   All instructions explained to the patient, with a verbal understanding of the material. Patient agrees to go over the instructions while at home for a better understanding. Patient also instructed to self quarantine after being tested for COVID-19. The opportunity to ask questions was provided.

## 2024-01-29 ENCOUNTER — Encounter (HOSPITAL_COMMUNITY)
Admission: RE | Disposition: A | Discharge status: Home / Self Care | Payer: Self-pay | Source: Home / Self Care | Attending: Thoracic Surgery (Cardiothoracic Vascular Surgery)

## 2024-01-29 ENCOUNTER — Other Ambulatory Visit: Payer: Self-pay

## 2024-01-29 ENCOUNTER — Encounter (HOSPITAL_COMMUNITY): Payer: Self-pay | Admitting: Thoracic Surgery (Cardiothoracic Vascular Surgery)

## 2024-01-29 ENCOUNTER — Inpatient Hospital Stay (HOSPITAL_COMMUNITY)

## 2024-01-29 ENCOUNTER — Telehealth: Payer: Self-pay

## 2024-01-29 ENCOUNTER — Inpatient Hospital Stay (HOSPITAL_COMMUNITY)
Admission: RE | Admit: 2024-01-29 | Discharge: 2024-02-02 | DRG: 236 | Disposition: A | Discharge status: Home / Self Care | Attending: Thoracic Surgery (Cardiothoracic Vascular Surgery) | Admitting: Thoracic Surgery (Cardiothoracic Vascular Surgery)

## 2024-01-29 DIAGNOSIS — I493 Ventricular premature depolarization: Secondary | ICD-10-CM | POA: Diagnosis not present

## 2024-01-29 DIAGNOSIS — I2510 Atherosclerotic heart disease of native coronary artery without angina pectoris: Secondary | ICD-10-CM

## 2024-01-29 DIAGNOSIS — E785 Hyperlipidemia, unspecified: Secondary | ICD-10-CM | POA: Diagnosis present

## 2024-01-29 DIAGNOSIS — E119 Type 2 diabetes mellitus without complications: Secondary | ICD-10-CM

## 2024-01-29 DIAGNOSIS — E876 Hypokalemia: Secondary | ICD-10-CM | POA: Diagnosis not present

## 2024-01-29 DIAGNOSIS — I2582 Chronic total occlusion of coronary artery: Secondary | ICD-10-CM | POA: Diagnosis present

## 2024-01-29 DIAGNOSIS — J9811 Atelectasis: Secondary | ICD-10-CM | POA: Diagnosis not present

## 2024-01-29 DIAGNOSIS — J90 Pleural effusion, not elsewhere classified: Secondary | ICD-10-CM | POA: Diagnosis not present

## 2024-01-29 DIAGNOSIS — Z7982 Long term (current) use of aspirin: Secondary | ICD-10-CM

## 2024-01-29 DIAGNOSIS — Z79899 Other long term (current) drug therapy: Secondary | ICD-10-CM

## 2024-01-29 DIAGNOSIS — Z951 Presence of aortocoronary bypass graft: Secondary | ICD-10-CM | POA: Diagnosis not present

## 2024-01-29 DIAGNOSIS — F17210 Nicotine dependence, cigarettes, uncomplicated: Secondary | ICD-10-CM | POA: Diagnosis present

## 2024-01-29 DIAGNOSIS — I25118 Atherosclerotic heart disease of native coronary artery with other forms of angina pectoris: Principal | ICD-10-CM | POA: Diagnosis present

## 2024-01-29 DIAGNOSIS — I10 Essential (primary) hypertension: Secondary | ICD-10-CM | POA: Diagnosis present

## 2024-01-29 DIAGNOSIS — D62 Acute posthemorrhagic anemia: Secondary | ICD-10-CM | POA: Diagnosis not present

## 2024-01-29 DIAGNOSIS — D6959 Other secondary thrombocytopenia: Secondary | ICD-10-CM | POA: Diagnosis not present

## 2024-01-29 DIAGNOSIS — Z7984 Long term (current) use of oral hypoglycemic drugs: Secondary | ICD-10-CM

## 2024-01-29 DIAGNOSIS — G250 Essential tremor: Secondary | ICD-10-CM | POA: Diagnosis present

## 2024-01-29 DIAGNOSIS — Z9889 Other specified postprocedural states: Secondary | ICD-10-CM

## 2024-01-29 DIAGNOSIS — Z91040 Latex allergy status: Secondary | ICD-10-CM | POA: Diagnosis not present

## 2024-01-29 DIAGNOSIS — I959 Hypotension, unspecified: Secondary | ICD-10-CM | POA: Diagnosis not present

## 2024-01-29 HISTORY — PX: RADIAL ARTERY HARVEST: SHX5067

## 2024-01-29 HISTORY — PX: CORONARY ARTERY BYPASS GRAFT: SHX141

## 2024-01-29 HISTORY — PX: INTRAOPERATIVE TRANSESOPHAGEAL ECHOCARDIOGRAM: SHX5062

## 2024-01-29 LAB — POCT I-STAT, CHEM 8
BUN: 23 mg/dL — ABNORMAL HIGH (ref 6–20)
BUN: 18 mg/dL (ref 6–20)
BUN: 19 mg/dL (ref 6–20)
Calcium, Ion: 1.25 mmol/L (ref 1.15–1.40)
Calcium, Ion: 1.09 mmol/L — ABNORMAL LOW (ref 1.15–1.40)
Calcium, Ion: 1.17 mmol/L (ref 1.15–1.40)
Chloride: 106 mmol/L (ref 98–111)
Chloride: 111 mmol/L (ref 98–111)
Chloride: 111 mmol/L (ref 98–111)
Creatinine, Ser: 0.8 mg/dL (ref 0.61–1.24)
Creatinine, Ser: 0.7 mg/dL (ref 0.61–1.24)
Creatinine, Ser: 0.7 mg/dL (ref 0.61–1.24)
Glucose, Bld: 98 mg/dL (ref 70–99)
Glucose, Bld: 119 mg/dL — ABNORMAL HIGH (ref 70–99)
Glucose, Bld: 133 mg/dL — ABNORMAL HIGH (ref 70–99)
HCT: 39 % (ref 39.0–52.0)
HCT: 32 % — ABNORMAL LOW (ref 39.0–52.0)
HCT: 34 % — ABNORMAL LOW (ref 39.0–52.0)
Hemoglobin: 13.3 g/dL (ref 13.0–17.0)
Hemoglobin: 10.9 g/dL — ABNORMAL LOW (ref 13.0–17.0)
Hemoglobin: 11.6 g/dL — ABNORMAL LOW (ref 13.0–17.0)
Potassium: 4.1 mmol/L (ref 3.5–5.1)
Potassium: 3.9 mmol/L (ref 3.5–5.1)
Potassium: 4.1 mmol/L (ref 3.5–5.1)
Sodium: 140 mmol/L (ref 135–145)
Sodium: 142 mmol/L (ref 135–145)
Sodium: 143 mmol/L (ref 135–145)
TCO2: 24 mmol/L (ref 22–32)
TCO2: 20 mmol/L — ABNORMAL LOW (ref 22–32)
TCO2: 21 mmol/L — ABNORMAL LOW (ref 22–32)

## 2024-01-29 LAB — POCT I-STAT 7, (LYTES, BLD GAS, ICA,H+H)
Acid-base deficit: 7 mmol/L — ABNORMAL HIGH (ref 0.0–2.0)
Acid-base deficit: 7 mmol/L — ABNORMAL HIGH (ref 0.0–2.0)
Bicarbonate: 18.3 mmol/L — ABNORMAL LOW (ref 20.0–28.0)
Bicarbonate: 19 mmol/L — ABNORMAL LOW (ref 20.0–28.0)
Calcium, Ion: 1.11 mmol/L — ABNORMAL LOW (ref 1.15–1.40)
Calcium, Ion: 1.11 mmol/L — ABNORMAL LOW (ref 1.15–1.40)
HCT: 32 % — ABNORMAL LOW (ref 39.0–52.0)
HCT: 31 % — ABNORMAL LOW (ref 39.0–52.0)
Hemoglobin: 10.9 g/dL — ABNORMAL LOW (ref 13.0–17.0)
Hemoglobin: 10.5 g/dL — ABNORMAL LOW (ref 13.0–17.0)
O2 Saturation: 97 %
O2 Saturation: 91 %
Patient temperature: 34.2
Patient temperature: 100.3
Potassium: 3.3 mmol/L — ABNORMAL LOW (ref 3.5–5.1)
Potassium: 3.6 mmol/L (ref 3.5–5.1)
Sodium: 145 mmol/L (ref 135–145)
Sodium: 144 mmol/L (ref 135–145)
TCO2: 19 mmol/L — ABNORMAL LOW (ref 22–32)
TCO2: 20 mmol/L — ABNORMAL LOW (ref 22–32)
pCO2 arterial: 30.1 mmHg — ABNORMAL LOW (ref 32–48)
pCO2 arterial: 40 mmHg (ref 32–48)
pH, Arterial: 7.379 (ref 7.35–7.45)
pH, Arterial: 7.289 — ABNORMAL LOW (ref 7.35–7.45)
pO2, Arterial: 81 mmHg — ABNORMAL LOW (ref 83–108)
pO2, Arterial: 72 mmHg — ABNORMAL LOW (ref 83–108)

## 2024-01-29 LAB — BASIC METABOLIC PANEL WITH GFR
Anion gap: 8 (ref 5–15)
Anion gap: 5 (ref 5–15)
BUN: 18 mg/dL (ref 6–20)
BUN: 16 mg/dL (ref 6–20)
CO2: 23 mmol/L (ref 22–32)
CO2: 25 mmol/L (ref 22–32)
Calcium: 7.8 mg/dL — ABNORMAL LOW (ref 8.9–10.3)
Calcium: 7.7 mg/dL — ABNORMAL LOW (ref 8.9–10.3)
Chloride: 108 mmol/L (ref 98–111)
Chloride: 111 mmol/L (ref 98–111)
Creatinine, Ser: 0.88 mg/dL (ref 0.61–1.24)
Creatinine, Ser: 0.86 mg/dL (ref 0.61–1.24)
GFR, Estimated: >60 mL/min (ref >60)
GFR, Estimated: >60 mL/min (ref >60)
Glucose, Bld: 104 mg/dL — ABNORMAL HIGH (ref 70–99)
Glucose, Bld: 110 mg/dL — ABNORMAL HIGH (ref 70–99)
Potassium: 3.8 mmol/L (ref 3.5–5.1)
Potassium: 3.7 mmol/L (ref 3.5–5.1)
Sodium: 139 mmol/L (ref 135–145)
Sodium: 141 mmol/L (ref 135–145)

## 2024-01-29 LAB — ECHO INTRAOPERATIVE TEE
AV Mean grad: 3 mmHg
AV Peak grad: 6 mmHg
Ao pk vel: 1.22 m/s
Height: 72 in
Weight: 2912 [oz_av]

## 2024-01-29 LAB — CBC
HCT: 39 % (ref 39.0–52.0)
HCT: 33.3 % — ABNORMAL LOW (ref 39.0–52.0)
Hemoglobin: 13.3 g/dL (ref 13.0–17.0)
Hemoglobin: 11.4 g/dL — ABNORMAL LOW (ref 13.0–17.0)
MCH: 31.5 pg (ref 26.0–34.0)
MCH: 31.6 pg (ref 26.0–34.0)
MCHC: 34.1 g/dL (ref 30.0–36.0)
MCHC: 34.2 g/dL (ref 30.0–36.0)
MCV: 92.4 fL (ref 80.0–100.0)
MCV: 92.2 fL (ref 80.0–100.0)
Platelets: 195 K/uL (ref 150–400)
Platelets: 185 K/uL (ref 150–400)
RBC: 4.22 MIL/uL (ref 4.22–5.81)
RBC: 3.61 MIL/uL — ABNORMAL LOW (ref 4.22–5.81)
RDW: 12.8 % (ref 11.5–15.5)
RDW: 12.8 % (ref 11.5–15.5)
WBC: 9.7 K/uL (ref 4.0–10.5)
WBC: 9.6 K/uL (ref 4.0–10.5)
nRBC: 0 % (ref 0.0–0.2)
nRBC: 0 % (ref 0.0–0.2)

## 2024-01-29 LAB — GLUCOSE, CAPILLARY
Glucose-Capillary: 105 mg/dL — ABNORMAL HIGH (ref 70–99)
Glucose-Capillary: 126 mg/dL — ABNORMAL HIGH (ref 70–99)
Glucose-Capillary: 103 mg/dL — ABNORMAL HIGH (ref 70–99)
Glucose-Capillary: 104 mg/dL — ABNORMAL HIGH (ref 70–99)
Glucose-Capillary: 111 mg/dL — ABNORMAL HIGH (ref 70–99)
Glucose-Capillary: 105 mg/dL — ABNORMAL HIGH (ref 70–99)
Glucose-Capillary: 82 mg/dL (ref 70–99)
Glucose-Capillary: 126 mg/dL — ABNORMAL HIGH (ref 70–99)
Glucose-Capillary: 109 mg/dL — ABNORMAL HIGH (ref 70–99)
Glucose-Capillary: 122 mg/dL — ABNORMAL HIGH (ref 70–99)
Glucose-Capillary: 147 mg/dL — ABNORMAL HIGH (ref 70–99)

## 2024-01-29 LAB — PROTIME-INR
INR: 1.3 — ABNORMAL HIGH (ref 0.8–1.2)
Prothrombin Time: 16.9 s — ABNORMAL HIGH (ref 11.4–15.2)

## 2024-01-29 LAB — HIV ANTIBODY (ROUTINE TESTING W REFLEX): HIV Screen 4th Generation wRfx: NONREACTIVE

## 2024-01-29 LAB — ABO/RH: ABO/RH(D): B POS

## 2024-01-29 LAB — MAGNESIUM
Magnesium: 2.4 mg/dL (ref 1.7–2.4)
Magnesium: 2.2 mg/dL (ref 1.7–2.4)

## 2024-01-29 LAB — APTT: aPTT: 37 s — ABNORMAL HIGH (ref 24–36)

## 2024-01-29 SURGERY — OFF PUMP CORONARY ARTERY BYPASS GRAFTING (CABG)
Anesthesia: General | Site: Chest

## 2024-01-29 MED ORDER — SODIUM CHLORIDE 0.9 % IV SOLN: INTRAVENOUS | Status: DC | PRN

## 2024-01-29 MED ORDER — CHLORHEXIDINE GLUCONATE CLOTH 2 % EX PADS
6.0000 | MEDICATED_PAD | Freq: Every day | CUTANEOUS | Status: DC
  Administered 2024-01-29 – 2024-02-01 (×4): 6 via TOPICAL

## 2024-01-29 MED ORDER — BISACODYL 5 MG PO TBEC
10.0000 mg | DELAYED_RELEASE_TABLET | Freq: Every day | ORAL | Status: DC
  Administered 2024-01-30 – 2024-02-01 (×3): 10 mg via ORAL
  Filled 2024-01-29 (×3): qty 2

## 2024-01-29 MED ORDER — NICARDIPINE HCL IN NACL 20-0.9 MG/200ML-% IV SOLN
1.0000 mg/h | INTRAVENOUS | Status: DC
  Filled 2024-01-29: qty 200

## 2024-01-29 MED ORDER — CHLORHEXIDINE GLUCONATE 0.12 % MT SOLN: 15.0000 mL | Freq: Once | OROMUCOSAL | Status: DC

## 2024-01-29 MED ORDER — POTASSIUM CHLORIDE 10 MEQ/50ML IV SOLN
10.0000 meq | INTRAVENOUS | Status: AC
  Administered 2024-01-29 (×2): 10 meq via INTRAVENOUS
  Filled 2024-01-29: qty 50

## 2024-01-29 MED ORDER — CEFAZOLIN SODIUM-DEXTROSE 2-4 GM/100ML-% IV SOLN
2.0000 g | Freq: Three times a day (TID) | INTRAVENOUS | Status: AC
  Administered 2024-01-29 – 2024-01-31 (×6): 2 g via INTRAVENOUS
  Filled 2024-01-29 (×6): qty 100

## 2024-01-29 MED ORDER — MIDAZOLAM HCL (PF) 10 MG/2ML IJ SOLN
INTRAMUSCULAR | Status: AC
  Filled 2024-01-29: qty 2

## 2024-01-29 MED ORDER — LACTATED RINGERS IV SOLN: INTRAVENOUS | Status: DC | PRN

## 2024-01-29 MED ORDER — METOPROLOL TARTRATE 12.5 MG HALF TABLET
12.5000 mg | ORAL_TABLET | Freq: Two times a day (BID) | ORAL | Status: DC
  Administered 2024-01-30 – 2024-01-31 (×3): 12.5 mg via ORAL
  Filled 2024-01-29 (×4): qty 1

## 2024-01-29 MED ORDER — PROPOFOL 10 MG/ML IV BOLUS
INTRAVENOUS | Status: DC | PRN
  Administered 2024-01-29: 70 mg via INTRAVENOUS
  Administered 2024-01-29: 130 mg via INTRAVENOUS

## 2024-01-29 MED ORDER — ACETAMINOPHEN 500 MG PO TABS
1000.0000 mg | ORAL_TABLET | Freq: Four times a day (QID) | ORAL | Status: DC
  Administered 2024-01-30 – 2024-02-02 (×12): 1000 mg via ORAL
  Filled 2024-01-29 (×12): qty 2

## 2024-01-29 MED ORDER — INSULIN REGULAR(HUMAN) IN NACL 100-0.9 UT/100ML-% IV SOLN: INTRAVENOUS | Status: DC

## 2024-01-29 MED ORDER — PANTOPRAZOLE SODIUM 40 MG IV SOLR
40.0000 mg | Freq: Every day | INTRAVENOUS | Status: AC
Start: 2024-01-29 — End: 2024-01-30
  Administered 2024-01-29 – 2024-01-30 (×2): 40 mg via INTRAVENOUS
  Filled 2024-01-29 (×2): qty 10

## 2024-01-29 MED ORDER — SODIUM CHLORIDE 0.9% FLUSH: 3.0000 mL | INTRAVENOUS | Status: DC | PRN

## 2024-01-29 MED ORDER — VANCOMYCIN HCL IN DEXTROSE 1-5 GM/200ML-% IV SOLN
1000.0000 mg | Freq: Once | INTRAVENOUS | Status: AC
Start: 2024-01-29 — End: 2024-01-29
  Administered 2024-01-29: 1000 mg via INTRAVENOUS
  Filled 2024-01-29: qty 200

## 2024-01-29 MED ORDER — CHLORHEXIDINE GLUCONATE 4 % EX SOLN: 30.0000 mL | CUTANEOUS | Status: DC

## 2024-01-29 MED ORDER — NOREPINEPHRINE 4 MG/250ML-% IV SOLN
0.0000 ug/min | INTRAVENOUS | Status: DC
  Administered 2024-01-29: 12 ug/min via INTRAVENOUS
  Administered 2024-01-30: 3 ug/min via INTRAVENOUS
  Filled 2024-01-29 (×3): qty 250

## 2024-01-29 MED ORDER — CHLORHEXIDINE GLUCONATE 0.12 % MT SOLN
15.0000 mL | OROMUCOSAL | Status: AC
  Administered 2024-01-29: 15 mL via OROMUCOSAL
  Filled 2024-01-29: qty 15

## 2024-01-29 MED ORDER — DOCUSATE SODIUM 100 MG PO CAPS
200.0000 mg | ORAL_CAPSULE | Freq: Every day | ORAL | Status: DC
  Administered 2024-01-30 – 2024-02-01 (×3): 200 mg via ORAL
  Filled 2024-01-29 (×3): qty 2

## 2024-01-29 MED ORDER — ~~LOC~~ CARDIAC SURGERY, PATIENT & FAMILY EDUCATION
Freq: Once | Status: DC
  Filled 2024-01-29: qty 1

## 2024-01-29 MED ORDER — CHLORHEXIDINE GLUCONATE CLOTH 2 % EX PADS: 6.0000 | MEDICATED_PAD | Freq: Every day | CUTANEOUS | Status: DC

## 2024-01-29 MED ORDER — POLYETHYLENE GLYCOL 3350 17 G PO PACK: 17.0000 g | PACK | Freq: Every day | ORAL | Status: DC | PRN

## 2024-01-29 MED ORDER — LACTATED RINGERS IV SOLN: INTRAVENOUS | Status: AC

## 2024-01-29 MED ORDER — ASPIRIN 81 MG PO CHEW
324.0000 mg | CHEWABLE_TABLET | Freq: Once | ORAL | Status: AC
  Administered 2024-01-29: 324 mg via ORAL
  Filled 2024-01-29: qty 4

## 2024-01-29 MED ORDER — ONDANSETRON HCL 4 MG/2ML IJ SOLN: 4.0000 mg | Freq: Four times a day (QID) | INTRAMUSCULAR | Status: DC | PRN

## 2024-01-29 MED ORDER — EPINEPHRINE 1 MG/10ML IJ SOSY
PREFILLED_SYRINGE | INTRAMUSCULAR | Status: AC
  Filled 2024-01-29: qty 10

## 2024-01-29 MED ORDER — PLASMA-LYTE A IV SOLN
INTRAVENOUS | Status: DC | PRN
  Administered 2024-01-29: 500 mL via INTRAVASCULAR

## 2024-01-29 MED ORDER — MIDAZOLAM HCL 2 MG/2ML IJ SOLN
2.0000 mg | INTRAMUSCULAR | Status: DC | PRN
Start: 2024-01-29 — End: 2024-01-31

## 2024-01-29 MED ORDER — METOPROLOL TARTRATE 5 MG/5ML IV SOLN: 2.5000 mg | INTRAVENOUS | Status: DC | PRN

## 2024-01-29 MED ORDER — FENTANYL CITRATE (PF) 250 MCG/5ML IJ SOLN
INTRAMUSCULAR | Status: AC
  Filled 2024-01-29: qty 5

## 2024-01-29 MED ORDER — ASPIRIN 81 MG PO CHEW
324.0000 mg | CHEWABLE_TABLET | Freq: Every day | ORAL | Status: DC
  Filled 2024-01-29 (×2): qty 4

## 2024-01-29 MED ORDER — METOPROLOL TARTRATE 25 MG/10 ML ORAL SUSPENSION
12.5000 mg | Freq: Two times a day (BID) | ORAL | Status: DC
Start: 2024-01-29 — End: 2024-02-01
  Filled 2024-01-29 (×2): qty 5

## 2024-01-29 MED ORDER — PROPOFOL 10 MG/ML IV BOLUS
INTRAVENOUS | Status: AC
  Filled 2024-01-29: qty 20

## 2024-01-29 MED ORDER — DEXMEDETOMIDINE HCL IN NACL 400 MCG/100ML IV SOLN
0.0000 ug/kg/h | INTRAVENOUS | Status: DC
  Administered 2024-01-29: 0.5 ug/kg/h via INTRAVENOUS
  Filled 2024-01-29: qty 100

## 2024-01-29 MED ORDER — BISACODYL 10 MG RE SUPP: 10.0000 mg | Freq: Every day | RECTAL | Status: DC

## 2024-01-29 MED ORDER — SODIUM CHLORIDE 0.9 % IV SOLN: INTRAVENOUS | Status: DC

## 2024-01-29 MED ORDER — 0.9 % SODIUM CHLORIDE (POUR BTL) OPTIME
TOPICAL | Status: DC | PRN
Start: 2024-01-29 — End: 2024-01-29
  Administered 2024-01-29: 5000 mL

## 2024-01-29 MED ORDER — ASPIRIN 325 MG PO TBEC
325.0000 mg | DELAYED_RELEASE_TABLET | Freq: Every day | ORAL | Status: DC
  Administered 2024-01-30 – 2024-02-02 (×4): 325 mg via ORAL
  Filled 2024-01-29 (×4): qty 1

## 2024-01-29 MED ORDER — HEPARIN SODIUM (PORCINE) 1000 UNIT/ML IJ SOLN
INTRAMUSCULAR | Status: DC | PRN
  Administered 2024-01-29: 12000 [IU] via INTRAVENOUS

## 2024-01-29 MED ORDER — SODIUM BICARBONATE 8.4 % IV SOLN: 50.0000 meq | Freq: Once | INTRAVENOUS | Status: AC

## 2024-01-29 MED ORDER — PHENYLEPHRINE 80 MCG/ML (10ML) SYRINGE FOR IV PUSH (FOR BLOOD PRESSURE SUPPORT)
PREFILLED_SYRINGE | INTRAVENOUS | Status: AC
Start: 2024-01-29 — End: 2024-01-29
  Filled 2024-01-29: qty 10

## 2024-01-29 MED ORDER — LACTATED RINGERS IV SOLN: INTRAVENOUS | Status: DC

## 2024-01-29 MED ORDER — POTASSIUM CHLORIDE 10 MEQ/50ML IV SOLN
10.0000 meq | INTRAVENOUS | Status: AC
  Administered 2024-01-29 (×3): 10 meq via INTRAVENOUS
  Filled 2024-01-29: qty 50

## 2024-01-29 MED ORDER — SODIUM CHLORIDE 0.9% FLUSH: 10.0000 mL | INTRAVENOUS | Status: DC | PRN

## 2024-01-29 MED ORDER — NOREPINEPHRINE BITARTRATE 1 MG/ML IV SOLN
INTRAVENOUS | Status: DC | PRN
  Administered 2024-01-29 (×4): .5 mL via INTRAVENOUS

## 2024-01-29 MED ORDER — FENTANYL CITRATE (PF) 250 MCG/5ML IJ SOLN
INTRAMUSCULAR | Status: DC | PRN
  Administered 2024-01-29: 100 ug via INTRAVENOUS
  Administered 2024-01-29: 250 ug via INTRAVENOUS
  Administered 2024-01-29: 25 ug via INTRAVENOUS
  Administered 2024-01-29: 225 ug via INTRAVENOUS
  Administered 2024-01-29: 50 ug via INTRAVENOUS
  Administered 2024-01-29: 100 ug via INTRAVENOUS

## 2024-01-29 MED ORDER — PANTOPRAZOLE SODIUM 40 MG PO TBEC
40.0000 mg | DELAYED_RELEASE_TABLET | Freq: Every day | ORAL | Status: DC
  Administered 2024-01-31 – 2024-02-02 (×3): 40 mg via ORAL
  Filled 2024-01-29 (×3): qty 1

## 2024-01-29 MED ORDER — SODIUM CHLORIDE 0.9% FLUSH
10.0000 mL | Freq: Two times a day (BID) | INTRAVENOUS | Status: DC
  Administered 2024-01-29: 10 mL

## 2024-01-29 MED ORDER — SODIUM CHLORIDE 0.9% FLUSH: 3.0000 mL | Freq: Two times a day (BID) | INTRAVENOUS | Status: DC

## 2024-01-29 MED ORDER — FENTANYL CITRATE (PF) 250 MCG/5ML IJ SOLN
INTRAMUSCULAR | Status: AC
Start: 2024-01-29 — End: 2024-01-29
  Filled 2024-01-29: qty 5

## 2024-01-29 MED ORDER — SODIUM CHLORIDE 0.9 % IV SOLN: 250.0000 mL | INTRAVENOUS | Status: DC

## 2024-01-29 MED ORDER — MAGNESIUM SULFATE 4 GM/100ML IV SOLN
4.0000 g | Freq: Once | INTRAVENOUS | Status: AC
Start: 2024-01-29 — End: 2024-01-29
  Administered 2024-01-29: 4 g via INTRAVENOUS
  Filled 2024-01-29: qty 100

## 2024-01-29 MED ORDER — ALBUMIN HUMAN 5 % IV SOLN
250.0000 mL | INTRAVENOUS | Status: DC | PRN
  Administered 2024-01-29 (×3): 12.5 g via INTRAVENOUS
  Filled 2024-01-29: qty 250

## 2024-01-29 MED ORDER — ROCURONIUM BROMIDE 10 MG/ML (PF) SYRINGE
PREFILLED_SYRINGE | INTRAVENOUS | Status: DC | PRN
  Administered 2024-01-29: 100 mg via INTRAVENOUS
  Administered 2024-01-29 (×2): 50 mg via INTRAVENOUS

## 2024-01-29 MED ORDER — OXYCODONE HCL 5 MG PO TABS
5.0000 mg | ORAL_TABLET | ORAL | Status: DC | PRN
  Administered 2024-01-29 – 2024-01-31 (×10): 10 mg via ORAL
  Filled 2024-01-29 (×10): qty 2

## 2024-01-29 MED ORDER — MORPHINE SULFATE (PF) 2 MG/ML IV SOLN
1.0000 mg | INTRAVENOUS | Status: DC | PRN
  Administered 2024-01-29 (×2): 2 mg via INTRAVENOUS
  Administered 2024-01-29: 1 mg via INTRAVENOUS
  Administered 2024-01-30 (×5): 2 mg via INTRAVENOUS
  Filled 2024-01-29 (×9): qty 1

## 2024-01-29 MED ORDER — METOPROLOL TARTRATE 12.5 MG HALF TABLET
12.5000 mg | ORAL_TABLET | Freq: Once | ORAL | Status: DC
  Filled 2024-01-29: qty 1

## 2024-01-29 MED ORDER — NICARDIPINE HCL IN NACL 20-0.86 MG/200ML-% IV SOLN
3.0000 mg/h | INTRAVENOUS | Status: DC
  Administered 2024-01-29: 3 mg/h via INTRAVENOUS

## 2024-01-29 MED ORDER — ACETAMINOPHEN 160 MG/5ML PO SOLN
650.0000 mg | Freq: Once | ORAL | Status: AC
  Administered 2024-01-29: 650 mg
  Filled 2024-01-29: qty 20.3

## 2024-01-29 MED ORDER — SODIUM CHLORIDE 0.45 % IV SOLN: INTRAVENOUS | Status: DC | PRN

## 2024-01-29 MED ORDER — ALBUMIN HUMAN 5 % IV SOLN: INTRAVENOUS | Status: DC | PRN

## 2024-01-29 MED ORDER — CHLORHEXIDINE GLUCONATE 0.12 % MT SOLN
15.0000 mL | Freq: Once | OROMUCOSAL | Status: AC
  Administered 2024-01-29: 15 mL via OROMUCOSAL
  Filled 2024-01-29: qty 15

## 2024-01-29 MED ORDER — EZETIMIBE 10 MG PO TABS
10.0000 mg | ORAL_TABLET | Freq: Every day | ORAL | Status: DC
  Administered 2024-01-30 – 2024-02-01 (×3): 10 mg via ORAL
  Filled 2024-01-29 (×3): qty 1

## 2024-01-29 MED ORDER — ACETAMINOPHEN 160 MG/5ML PO SOLN: 1000.0000 mg | Freq: Four times a day (QID) | ORAL | Status: DC

## 2024-01-29 MED ORDER — MIDAZOLAM HCL (PF) 5 MG/ML IJ SOLN
INTRAMUSCULAR | Status: DC | PRN
  Administered 2024-01-29 (×4): 2 mg via INTRAVENOUS

## 2024-01-29 MED ORDER — ROCURONIUM BROMIDE 10 MG/ML (PF) SYRINGE
PREFILLED_SYRINGE | INTRAVENOUS | Status: AC
  Filled 2024-01-29: qty 10

## 2024-01-29 MED ORDER — SODIUM BICARBONATE 8.4 % IV SOLN
100.0000 meq | Freq: Once | INTRAVENOUS | Status: AC
  Administered 2024-01-29: 100 meq via INTRAVENOUS

## 2024-01-29 MED ORDER — SODIUM BICARBONATE 8.4 % IV SOLN
INTRAVENOUS | Status: AC
  Administered 2024-01-29: 50 meq via INTRAVENOUS
  Filled 2024-01-29: qty 50

## 2024-01-29 MED ORDER — HALOPERIDOL LACTATE 5 MG/ML IJ SOLN
5.0000 mg | Freq: Once | INTRAMUSCULAR | Status: AC
  Administered 2024-01-29: 5 mg via INTRAVENOUS
  Filled 2024-01-29: qty 1

## 2024-01-29 MED ORDER — ATORVASTATIN CALCIUM 40 MG PO TABS
40.0000 mg | ORAL_TABLET | Freq: Every day | ORAL | Status: DC
  Administered 2024-01-29 – 2024-02-01 (×4): 40 mg via ORAL
  Filled 2024-01-29 (×4): qty 1

## 2024-01-29 MED ORDER — TRAMADOL HCL 50 MG PO TABS
50.0000 mg | ORAL_TABLET | ORAL | Status: DC | PRN
  Administered 2024-01-30: 50 mg via ORAL
  Filled 2024-01-29: qty 1

## 2024-01-29 MED ORDER — HYDROMORPHONE HCL 1 MG/ML IJ SOLN
1.0000 mg | Freq: Once | INTRAMUSCULAR | Status: AC
  Administered 2024-01-29: 1 mg via INTRAVENOUS
  Filled 2024-01-29: qty 1

## 2024-01-29 MED ORDER — PHENYLEPHRINE HCL-NACL 20-0.9 MG/250ML-% IV SOLN: 0.0000 ug/min | INTRAVENOUS | Status: DC

## 2024-01-29 MED ORDER — METOCLOPRAMIDE HCL 5 MG/ML IJ SOLN
10.0000 mg | Freq: Four times a day (QID) | INTRAMUSCULAR | Status: AC
  Administered 2024-01-29 – 2024-01-30 (×6): 10 mg via INTRAVENOUS
  Filled 2024-01-29 (×6): qty 2

## 2024-01-29 MED ORDER — PROTAMINE SULFATE 10 MG/ML IV SOLN
INTRAVENOUS | Status: DC | PRN
  Administered 2024-01-29 (×4): 20 mg via INTRAVENOUS
  Administered 2024-01-29: 40 mg via INTRAVENOUS

## 2024-01-29 MED ORDER — DEXTROSE 50 % IV SOLN: 0.0000 mL | INTRAVENOUS | Status: DC | PRN

## 2024-01-29 MED ORDER — SODIUM CHLORIDE (PF) 0.9 % IJ SOLN: OROMUCOSAL | Status: DC | PRN

## 2024-01-29 SURGICAL SUPPLY — 81 items
BAG DECANTER FOR FLEXI CONT (MISCELLANEOUS) ×6 IMPLANT
BLADE CLIPPER SURG (BLADE) ×3 IMPLANT
BLADE STERNUM SYSTEM 6 (BLADE) ×3 IMPLANT
BLOWER MISTER CAL-MED (MISCELLANEOUS) ×3 IMPLANT
BNDG ELASTIC 4INX 5YD STR LF (GAUZE/BANDAGES/DRESSINGS) IMPLANT
BNDG ELASTIC 4X5.8 VLCR STR LF (GAUZE/BANDAGES/DRESSINGS) ×3 IMPLANT
BNDG ELASTIC 6INX 5YD STR LF (GAUZE/BANDAGES/DRESSINGS) ×3 IMPLANT
BNDG GAUZE DERMACEA FLUFF 4 (GAUZE/BANDAGES/DRESSINGS) ×3 IMPLANT
CABLE SURGICAL S-101-97-12 (CABLE) IMPLANT
CANISTER SUCTION 3000ML PPV (SUCTIONS) ×3 IMPLANT
CANNULA MC2 2 STG 29/37 NON-V (CANNULA) ×3 IMPLANT
CANNULA NON VENT 20FR 12 (CANNULA) ×3 IMPLANT
CLIP APPLIE 9.375 SM OPEN (CLIP) ×3 IMPLANT
CLIP RETRACTION 3.0MM CORONARY (MISCELLANEOUS) ×3 IMPLANT
CLIP TI MEDIUM 24 (CLIP) IMPLANT
CLIP TI WIDE RED SMALL 24 (CLIP) IMPLANT
CONN ST 1/2X1/2 BEN (MISCELLANEOUS) ×3 IMPLANT
COVER MAYO STAND STRL (DRAPES) ×3 IMPLANT
COVER ULTRASOUND SURGICAL (MISCELLANEOUS) IMPLANT
CUFF TOURN SGL QUICK 18X4 (TOURNIQUET CUFF) IMPLANT
CUFF TRNQT CYL 24X4X16.5-23 (TOURNIQUET CUFF) IMPLANT
DRAIN CHANNEL 19F RND (DRAIN) IMPLANT
DRAPE EXTREMITY T 121X128X90 (DISPOSABLE) ×3 IMPLANT
DRAPE HALF SHEET 40X57 (DRAPES) ×3 IMPLANT
DRAPE INCISE IOBAN 66X45 STRL (DRAPES) ×3 IMPLANT
DRAPE SRG 135X102X78XABS (DRAPES) ×3 IMPLANT
DRAPE WARM FLUID 44X44 (DRAPES) ×3 IMPLANT
DRSG COVADERM 4X14 (GAUZE/BANDAGES/DRESSINGS) ×3 IMPLANT
ELECTRODE REM PT RTRN 9FT ADLT (ELECTROSURGICAL) ×6 IMPLANT
FELT TEFLON 1X6 (MISCELLANEOUS) ×6 IMPLANT
GAUZE 4X4 16PLY ~~LOC~~+RFID DBL (SPONGE) ×6 IMPLANT
GAUZE SPONGE 4X4 12PLY STRL (GAUZE/BANDAGES/DRESSINGS) ×6 IMPLANT
GEL ULTRASOUND 20GR AQUASONIC (MISCELLANEOUS) ×3 IMPLANT
GLOVE BIO SURGEON STRL SZ7 (GLOVE) ×6 IMPLANT
GLOVE BIOGEL PI IND STRL 7.5 (GLOVE) ×6 IMPLANT
GLOVE SURG SYN 7.5 PF PI (GLOVE) IMPLANT
GOWN STRL REUS W/ TWL LRG LVL3 (GOWN DISPOSABLE) ×12 IMPLANT
GOWN STRL REUS W/ TWL XL LVL3 (GOWN DISPOSABLE) ×6 IMPLANT
HEMOSTAT POWDER SURGIFOAM 1G (HEMOSTASIS) ×9 IMPLANT
INSERT SUTURE HOLDER (MISCELLANEOUS) ×3 IMPLANT
KIT BASIN OR (CUSTOM PROCEDURE TRAY) ×3 IMPLANT
KIT SUCTION CATH 14FR (SUCTIONS) ×9 IMPLANT
KIT TURNOVER KIT B (KITS) ×3 IMPLANT
KIT VASOVIEW HEMOPRO 2 VH 4000 (KITS) ×3 IMPLANT
LEAD PACING MYOCARDI (MISCELLANEOUS) ×3 IMPLANT
MARKER GRAFT CORONARY BYPASS (MISCELLANEOUS) ×9 IMPLANT
NS IRRIG 1000ML POUR BTL (IV SOLUTION) ×15 IMPLANT
OFFPUMP STABILIZER SUV (MISCELLANEOUS) ×3 IMPLANT
PACK E OPEN HEART (SUTURE) ×3 IMPLANT
PACK OPEN HEART (CUSTOM PROCEDURE TRAY) ×3 IMPLANT
PAD ARMBOARD POSITIONER FOAM (MISCELLANEOUS) ×6 IMPLANT
PAD ELECT DEFIB RADIOL ZOLL (MISCELLANEOUS) ×3 IMPLANT
PENCIL BUTTON HOLSTER BLD 10FT (ELECTRODE) ×3 IMPLANT
POSITIONER ACROBAT-I OFFPUMP (MISCELLANEOUS) ×3 IMPLANT
POSITIONER HEAD DONUT 9IN (MISCELLANEOUS) ×3 IMPLANT
PUNCH AORTIC ROTATE 4.5MM 8IN (MISCELLANEOUS) IMPLANT
SHEARS HARMONIC 9CM CVD (BLADE) ×3 IMPLANT
SPONGE T-LAP 18X18 ~~LOC~~+RFID (SPONGE) ×24 IMPLANT
SUPPORT HEART JANKE-BARRON (MISCELLANEOUS) ×3 IMPLANT
SUT BONE WAX W31G (SUTURE) ×3 IMPLANT
SUT MNCRL AB 3-0 PS2 18 (SUTURE) ×6 IMPLANT
SUT PDS AB 1 CTX 36 (SUTURE) ×6 IMPLANT
SUT PROLENE 3 0 SH DA (SUTURE) ×3 IMPLANT
SUT PROLENE 5 0 C 1 36 (SUTURE) ×9 IMPLANT
SUT PROLENE BLUE 7 0 (SUTURE) ×3 IMPLANT
SUT STEEL 6MS V (SUTURE) ×6 IMPLANT
SUT STEEL STERNAL CCS#1 18IN (SUTURE) IMPLANT
SUT VIC AB 2-0 CT1 TAPERPNT 27 (SUTURE) IMPLANT
SUT VIC AB 3-0 SH 27X BRD (SUTURE) IMPLANT
SUT VIC AB 3-0 X1 27 (SUTURE) IMPLANT
SYR 50ML SLIP (SYRINGE) IMPLANT
SYSTEM SAHARA CHEST DRAIN ATS (WOUND CARE) ×3 IMPLANT
TAPE CLOTH SURG 4X10 WHT LF (GAUZE/BANDAGES/DRESSINGS) IMPLANT
TAPES RETRACTO (MISCELLANEOUS) ×6 IMPLANT
TOWEL GREEN STERILE (TOWEL DISPOSABLE) ×6 IMPLANT
TOWEL GREEN STERILE FF (TOWEL DISPOSABLE) ×6 IMPLANT
TRAY FOLEY SLVR 16FR LF STAT (SET/KITS/TRAYS/PACK) IMPLANT
TRAY FOLEY SLVR 16FR TEMP STAT (SET/KITS/TRAYS/PACK) ×3 IMPLANT
TUBING LAP HI FLOW INSUFFLATIO (TUBING) ×3 IMPLANT
UNDERPAD 30X36 HEAVY ABSORB (UNDERPADS AND DIAPERS) ×6 IMPLANT
WATER STERILE IRR 1000ML POUR (IV SOLUTION) ×6 IMPLANT

## 2024-01-29 NOTE — Brief Op Note (Signed)
 01/29/2024  2:53 PM  PATIENT:  Phillip Clark  58 y.o. male  PRE-OPERATIVE DIAGNOSIS:  CORONARY ARTERY DISEASE  POST-OPERATIVE DIAGNOSIS:  CORONARY ARTERY DISEASE  PROCEDURE:  Procedure(s):  OFF PUMP CORONARY ARTERY BYPASS GRAFTING X 1 -Radial Artery to PDA  ECHOCARDIOGRAM, TRANSESOPHAGEAL, INTRAOPERATIVE (N/A)  OPEN LEFT RADIAL ARTERY HARVEST Harvest time: 44 min. Prep Time: 5 min  Open Saphenous Vein Harvest Right Lower Leg Vein harvest time: 15 min Vein prep time: 5 min  SURGEON:  Surgeons and Role:    * Lightfoot, Linnie KIDD, MD - Primary  PHYSICIAN ASSISTANT: Rocky Shad PA-C  ASSISTANTS: Evalene Domino RNFA   ANESTHESIA:   general  EBL:  200 mL   BLOOD ADMINISTERED:  CC CELLSAVER  DRAINS: Mediastinal chest drains   LOCAL MEDICATIONS USED:  NONE  SPECIMEN:  No Specimen  DISPOSITION OF SPECIMEN:  N/A  COUNTS:  YES  TOURNIQUET:  * No tourniquets in log *  DICTATION: .Dragon Dictation  PLAN OF CARE: Admit to inpatient   PATIENT DISPOSITION:  ICU - intubated and hemodynamically stable.   Delay start of Pharmacological VTE agent (>24hrs) due to surgical blood loss or risk of bleeding: yes

## 2024-01-29 NOTE — Anesthesia Procedure Notes (Signed)
 Procedure Name: Intubation Date/Time: 01/29/2024 8:07 AM  Performed by: Vertie Russell NOVAK, CRNAPre-anesthesia Checklist: Patient identified, Emergency Drugs available, Suction available and Patient being monitored Patient Re-evaluated:Patient Re-evaluated prior to induction Oxygen Delivery Method: Circle System Utilized Preoxygenation: Pre-oxygenation with 100% oxygen Induction Type: IV induction Ventilation: Mask ventilation without difficulty and Oral airway inserted - appropriate to patient size Laryngoscope Size: Mac and 4 Grade View: Grade I Tube type: Oral Tube size: 8.0 mm Number of attempts: 1 Airway Equipment and Method: Stylet and Oral airway Placement Confirmation: ETT inserted through vocal cords under direct vision, positive ETCO2 and breath sounds checked- equal and bilateral Secured at: 23 cm Tube secured with: Tape Dental Injury: Teeth and Oropharynx as per pre-operative assessment

## 2024-01-29 NOTE — Consult Note (Signed)
 NAME:  Phillip Clark, MRN:  [id number], DOB:  19-Apr-1966, LOS: 0 ADMISSION DATE:  01/29/2024, CONSULTATION DATE:  01/29/2024 REFERRING MD:  Shyrl MA CHIEF COMPLAINT:  s/p CABG   History of Present Illness:  58 year old male with past medical history of CAD, diabetes, hyperlipidemia presenting to [facility] for CABG. Evaluated by TCTS, Dr. Shyrl for single-vessel CAD. He had attempted PCI of RCA which was unsuccessful. Patient has had ongoing, progressively worsening shortness of breath and chest pain over the past two years.   Off pump CABG x1 7/17 EBL: 200   Pertinent  Medical History  CAD, diabetes, hyperlipidemia   Significant Hospital Events: Including procedures, antibiotic start and stop dates in addition to other pertinent events   7/17: CABG x1 off pump. To ICU post-operatively   Interim History / Subjective:  Admit to ICU post-op CABG  Objective   Blood pressure 100/68, pulse 66, temperature 98.8 F (37.1 C), resp. rate 18, height 6' (1.829 m), weight 82.6 kg, SpO2 95%.        Intake/Output Summary (Last 24 hours) at 01/29/2024 9171 Last data filed at 01/29/2024 0818 Gross per 24 hour  Intake 500 ml  Output --  Net 500 ml   Filed Weights   01/29/24 0551  Weight: 82.6 kg    Examination: General: younger appearing male, post-op sedated, intubated HENT: perrla, ncat, ett, ogt Lungs: ctab, vented, synchronous Cardiovascular: s1s2, no murmur, no edema  Abdomen: flat, soft, hypoactive bs Extremities: no edema, post-operative dressing to the left arm and right leg, both are pink, warm Neuro: sedated GU: foley  Resolved Hospital Problem list    Assessment & Plan:  CAD s/p unsuccessful PCI to RCA now s/p CABG x 1 left radial artery to PDA Echo 7/16 with EF 55-60%, RV normal. LHC on 11/2023 showing LAD 30%, Lcx 30%, RCA mid CTO - post-op management per TCTS - rapid wean per TCTS protocol - CXR/ ABG, CBC, BMET now - con't weaning pressors as able to maintain  MAP >70-90. Albumin  boluses prn for low SV.  - cardene  gtt for vasospasm continue  - insulin  gtt wean per protocol  - tele monitoring/ pacing prn - mediastinal drains per TCTS - multimodal pain control per protocol- oxycodone , tramadol , morphine  with bowel regimen - ASA, statin, zetia  to resume next day  - metoprolol  BID once off pressors - complete post-op antibiotics - monitor electrolytes, replete PRN   Post bypass vasoplegia On levo, neo post-operatively. May be in part related to cardene  drip for vasospasm  - wean for MAP >70-90  - PRN albumin  boluses   Post-operative vent management  - rapid wean per protocol  - full mechanical vent support - lung protective ventilation 6-8cc/kg Vt - VAP and PAD bundle in place  - titrate FiO2 to sat goal >92  - maintain peak/plats <30, driving pressures <84    Expected post-operative ABLA Expected post-operative consumptive thrombocytopenia  - trend - transfuse PRN Best Practice (right click and Reselect all SmartList Selections daily)   Diet/type: NPO DVT prophylaxis: SCD GI prophylaxis: PPI Lines: Central line, Arterial Line, and yes and it is still needed Foley:  Yes, and it is still needed Code Status:  full code Last date of multidisciplinary goals of care discussion [pending]  Labs   CBC: Recent Labs  Lab 01/28/24 0835  WBC 9.2  HGB 16.0  HCT 47.0  MCV 93.3  PLT 255    Basic Metabolic Panel: Recent Labs  Lab 01/28/24 0835  NA  138  K 4.3  CL 101  CO2 27  GLUCOSE 93  BUN 24*  CREATININE 1.14  CALCIUM  9.4   GFR: Estimated Creatinine Clearance: 79.4 mL/min (by C-G formula based on SCr of 1.14 mg/dL). Recent Labs  Lab 01/28/24 0835  WBC 9.2    Liver Function Tests: Recent Labs  Lab 01/28/24 0835  AST 24  ALT 27  ALKPHOS 69  BILITOT 0.7  PROT 7.2  ALBUMIN  4.2   No results for input(s): LIPASE, AMYLASE in the last 168 hours. No results for input(s): AMMONIA in the last 168  hours.  ABG No results found for: PHART, PCO2ART, PO2ART, HCO3, TCO2, ACIDBASEDEF, O2SAT   Coagulation Profile: Recent Labs  Lab 01/28/24 0835  INR 1.0    Cardiac Enzymes: No results for input(s): CKTOTAL, CKMB, CKMBINDEX, TROPONINI in the last 168 hours.  HbA1C: Hgb A1c MFr Bld  Date/Time Value Ref Range Status  01/28/2024 08:35 AM 5.5 4.8 - 5.6 % Final    Comment:    (NOTE) Diagnosis of Diabetes The following HbA1c ranges recommended by the American Diabetes Association (ADA) may be used as an aid in the diagnosis of diabetes mellitus.  Hemoglobin             Suggested A1C NGSP%              Diagnosis  <5.7                   Non Diabetic  5.7-6.4                Pre-Diabetic  >6.4                   Diabetic  <7.0                   Glycemic control for                       adults with diabetes.    02/09/2021 09:16 PM 7.3 (H) 4.8 - 5.6 % Final    Comment:    (NOTE) Pre diabetes:          5.7%-6.4%  Diabetes:              >6.4%  Glycemic control for   <7.0% adults with diabetes     CBG: Recent Labs  Lab 01/28/24 0802 01/29/24 0601  GLUCAP 93 105*    Review of Systems:   As above  Past Medical History:  He,  has a past medical history of Coronary artery calcification of native artery, Diabetes mellitus without complication (HCC), Essential tremor, Hyperlipidemia, Nonobstructive atherosclerosis of coronary artery, and Pneumonia.   Surgical History:   Past Surgical History:  Procedure Laterality Date   CORONARY CTO INTERVENTION N/A 01/01/2024   Procedure: CORONARY CTO INTERVENTION;  Surgeon: Swaziland, Peter M, MD;  Location: [facility] INVASIVE CV LAB;  Service: Cardiovascular;  Laterality: N/A;   FRACTURE SURGERY Left    fracture foot with pin placed   HIP ARTHROPLASTY Right 2021   LEFT HEART CATH AND CORONARY ANGIOGRAPHY N/A 10/02/2020   Procedure: LEFT HEART CATH AND CORONARY ANGIOGRAPHY;  Surgeon: [name], MD;   Location: [facility] INVASIVE CV LAB;  Service: Cardiovascular;  Laterality: N/A;   LEFT HEART CATH AND CORONARY ANGIOGRAPHY N/A 11/26/2023   Procedure: LEFT HEART CATH AND CORONARY ANGIOGRAPHY;  Surgeon: [name], MD;  Location: [facility] INVASIVE CV LAB;  Service: Cardiovascular;  Laterality: N/A;  UMBILICAL HERNIA REPAIR     VASECTOMY       Social History:   reports that he has been smoking cigarettes. He started smoking about 41 years ago. He has a 20.3 pack-year smoking history. He has never used smokeless tobacco. He reports that he does not currently use alcohol. He reports that he does not use drugs.   Family History:  His family history includes Diabetes in his mother; Heart attack in his father; Hyperlipidemia in his brother, father, mother, sister, sister, sister, and sister; Hypertension in his brother, father, mother, and sister; Thyroid disease in his mother.   Allergies Allergies  Allergen Reactions   Latex Rash    Severe   Crestor [Rosuvastatin Calcium ] Other (See Comments)    MYALGIAS   Rosuvastatin Other (See Comments)    Other reaction(s): Myalgias (intolerance)   Other Other (See Comments)    Steroid/Caused blood Glucose to increase   Topiramate Other (See Comments)    Tingling legs    Varenicline Other (See Comments)    Homicidal Ideations    Tape Rash    takes skins off     Home Medications  Prior to Admission medications   Medication Sig Start Date End Date Taking? Authorizing Provider  aspirin  EC 81 MG tablet Take 1 tablet (81 mg total) by mouth daily. Swallow whole. 08/22/20  Yes Tolia, Sunit, DO  atorvastatin  (LIPITOR) 40 MG tablet TAKE 1 TABLET BY MOUTH AT BEDTIME 11/26/23  Yes Tolia, Sunit, DO  ezetimibe  (ZETIA ) 10 MG tablet Take 1 tablet by mouth once daily 09/01/23  Yes Tolia, Sunit, DO  isosorbide  mononitrate (IMDUR ) 30 MG 24 hr tablet Take 1 tablet (30 mg total) by mouth 2 (two) times daily. 01/02/24  Yes Trudy Birmingham, PA-C  JARDIANCE  25 MG TABS  tablet Take 25 mg by mouth daily. 08/01/23  Yes [provider]  magnesium  oxide (MAG-OX) 400 MG tablet Take 400 mg by mouth daily.   Yes [provider]  metoprolol  succinate (TOPROL -XL) 100 MG 24 hr tablet Take 1 tablet (100 mg total) by mouth daily. WITH OR IMMEDIATELY FOLLOWING A MEAL 10/06/23  Yes Tolia, Sunit, DO  Multiple Vitamin (MULTIVITAMIN ADULT PO) Take 1 tablet by mouth daily.   Yes [provider]  nitroGLYCERIN  (NITROSTAT ) 0.4 MG SL tablet Place 1 tablet (0.4 mg total) under the tongue every 5 (five) minutes as needed for chest pain. 01/15/24  Yes Swaziland, Peter M, MD  Omega-3 Fatty Acids (FISH OIL) 1000 MG CAPS Take 1,000 mg by mouth daily.   Yes [provider]  ranolazine  (RANEXA ) 500 MG 12 hr tablet Take 1 tablet (500 mg total) by mouth 2 (two) times daily. 01/03/24  Yes Darryle Thom CROME, PA-C     Critical care time: [address]   Tinnie FORBES Adolph DEVONNA [facility] 01/29/24 9:48 AM  Please see [email or web address] for pager details.  From 7A-7P if no response, please call [phone] After hours, please call ELink [phone]

## 2024-01-29 NOTE — Progress Notes (Signed)
 Extubated early  He had an episode of shivering, agitation and pain and was given dilaudid  with some improvement and placed on precedex .  He was noted to have ectopy as frequent as bigeminy after extubation. Checked a mag/K and repleting the K a bit more. Mag was repleted post-operatively. Also checked a gas and given 1 amp bicarb and CXR which showed no changed in pacer placement. Will keep monitoring  Was called to bedside again around 5:35pm for low SVR and BP. He was able to get up and dangle on side of bed and then felt tired. SVR drop and SBP 90s. On 6 levo. Giving additional albumin  bolus and going up on levo for now.   Tinnie FORBES Furth, PA-C [facility] 01/29/24 5:43 PM  Please see [email or web address] for pager details.  From 7A-7P if no response, please call [phone] After hours, please call ELink [phone]

## 2024-01-29 NOTE — Hospital Course (Addendum)
 History of Present Illness:  Phillip Clark is a 58 yo male with known history of HLD, HTN, nicotine abuse and CAD.  He recently developed complaints of typical exertional angina.  He was treated initially with medical therapy but his symptoms persisted.  Due to this PET myocardial perfusion was obtained which showed a large ischemic area int he apical to basal inferior, infraseptal, and lateral walls.  He underwent repeat catheterization which showed occlusion of the mid RCA with left to right collaterals to the PDA and PL Branches.  He was referred to Dr. Swaziland for possible PCI evaluation.  This was attempted on 01/01/24 which was unsuccessful due to inability to cross the lesions.  Due to this coronary bypass grafting procedure was recommended and the patient was referred to [facility].  He was evaluated by Dr. Shyrl who felt patient was a reasonable surgical candidate.  The risks and benefits of the procedure were explained to the patient and he was agreeable to proceed.  Hospital Course:  Phillip Clark presented to [facility] on 01/29/2024.  He was taken to the operating room and underwent CABG x 1 utilizing radial artery to PDA.  He also underwent open left radial artery harvest and open harvest of saphenous vein from the right lower leg.  He tolerated the procedure without difficulty and was taken to the SICU in stable condition.  He was extubated the evening of surgery.  He was weaned off Levophed  as hemodynamics allowed.  He was transitioned off Cardene  to Norvasc  for his radial artery.  He underwent removal of arterial lines, pacing wires, and chest tubes without difficulty.  He was maintaining NSR and felt stable for transfer to the progressive care unit on 01/31/2024.  His lopressor  was titrated for PVCs.  He was on lasix  to facilitate diuresis. He was under his preoperative weight and his blood pressure was marginal, Lasix  was discontinued as discussed with Dr.  Lucas. His bowels began moving appropriately. He developed mild hypokalemia, potassium was supplemented. He is ambulating without difficulty on room air. His surgical incisions are healing without evidence of infection.  He was felt stable for discharge home today.

## 2024-01-29 NOTE — Transfer of Care (Signed)
 Immediate Anesthesia Transfer of Care Note  Patient: Phillip Clark  Procedure(s) Performed: OFF PUMP CORONARY ARTERY BYPASS GRAFTING X 1, USING HARVESTED LEFT RADIAL ARTERY  HARVESTED RIGHT SAPHENOUS VEIN (Chest) SURGICAL PROCUREMENT, ARTERY, RADIAL (Left: Arm Lower) ECHOCARDIOGRAM, TRANSESOPHAGEAL, INTRAOPERATIVE  Patient Location: ICU  Anesthesia Type:General  Level of Consciousness: sedated and Patient remains intubated per anesthesia plan  Airway & Oxygen Therapy: Patient remains intubated per anesthesia plan and Patient placed on Ventilator (see vital sign flow sheet for setting)  Post-op Assessment: Report given to RN and Post -op Vital signs reviewed and stable  Post vital signs: Reviewed and stable  Last Vitals:  Vitals Value Taken Time  BP 97/65 01/29/24 12:10  Temp    Pulse 78 01/29/24 12:11  Resp 16 01/29/24 12:11  SpO2 97 % 01/29/24 12:11  Vitals shown include unfiled device data.  Last Pain:  Vitals:   01/29/24 0620  PainSc: 0-No pain         Complications: No notable events documented.

## 2024-01-29 NOTE — Interval H&P Note (Signed)
 History and Physical Interval Note:  01/29/2024 7:37 AM  Phillip Clark Gentle  has presented today for surgery, with the diagnosis of CORONARY ARTERY DISEASE.  The various methods of treatment have been discussed with the patient and family. After consideration of risks, benefits and other options for treatment, the patient has consented to  Procedure(s): OFF PUMP CORONARY ARTERY BYPASS GRAFTING (CABG) (N/A) SURGICAL PROCUREMENT, ARTERY, RADIAL (Left) ECHOCARDIOGRAM, TRANSESOPHAGEAL, INTRAOPERATIVE (N/A) as a surgical intervention.  The patient's history has been reviewed, patient examined, no change in status, stable for surgery.  I have reviewed the patient's chart and labs.  Questions were answered to the patient's satisfaction.     Twala Collings MALVA Rayas

## 2024-01-29 NOTE — Progress Notes (Signed)
      [address]       [place] [address]             [phone]      S/p OPCAB x 1  Extubated  BP (!) 80/51   Pulse 94   Temp (!) 96.8 F (36 C)   Resp (!) 22   Ht 6' (1.829 m)   Wt 82.6 kg   SpO2 98%   BMI 24.68 kg/m  Currently in trigeminy CI 5.8 (flow track) Norepi @ 6 nicardipine  1 mg/hr 6L [place]- 97% sat   Intake/Output Summary (Last 24 hours) at 01/29/2024 1833 Last data filed at 01/29/2024 1800 Gross per 24 hour  Intake 4611.07 ml  Output 2348 ml  Net 2263.07 ml   K 3.3- given 5 runs Hct 31  Keyauna Graefe C. Kerrin, MD [facility] [phone]

## 2024-01-29 NOTE — Anesthesia Procedure Notes (Signed)
 Central Venous Catheter Insertion Performed by: Darlyn Rush, MD, anesthesiologist Start/End7/17/2025 7:06 AM, 01/29/2024 7:26 AM Patient location: Pre-op. Preanesthetic checklist: patient identified, IV checked, site marked, risks and benefits discussed, surgical consent, monitors and equipment checked, pre-op evaluation, timeout performed and anesthesia consent Position: Trendelenburg Lidocaine  1% used for infiltration and patient sedated Hand hygiene performed  and maximum sterile barriers used  Catheter size: 8.5 Fr Central line was placed.Sheath introducer Procedure performed using ultrasound guided technique. Ultrasound Notes:anatomy identified, needle tip was noted to be adjacent to the nerve/plexus identified, no ultrasound evidence of intravascular and/or intraneural injection and image(s) printed for medical record Attempts: 1 Following insertion, line sutured, dressing applied and Biopatch. Post procedure assessment: blood return through all ports, free fluid flow and no air  Patient tolerated the procedure well with no immediate complications.

## 2024-01-29 NOTE — Op Note (Signed)
 [address]       [place] [address]             [phone]                                         01/29/2024    Patient:  Phillip Clark Gentle Pre-Op Dx: Isolated RCA coronary artery disease   Post-op Dx:  same Procedure: Off pump CABG X 1.  Left radial artery to PDA Open left radial artery harvest Open right greater saphenous vein harvest   Surgeon and Role:      * Murdock Jellison, Linnie KIDD, MD - Primary    * E. Barrett, PA-C - assisting  An experienced assistant was required given the complexity of this surgery and the standard of surgical care. The assistant was needed for exposure, dissection, suctioning, retraction of delicate tissues and sutures, instrument exchange and for overall help during this procedure.     Anesthesia  general EBL:  Blood Administration: none  Drains: 57 F blake drain: R, mediastinal  Wires: V Counts: correct   Indications: 58 y.o. male with CTO RCA lesion.  He underwent unsuccessful PCI to this lesion recently in the past.  He remains very symptomatic.  This does fill via left-to-right collaterals.  We discussed the risks and benefits of an off-pump CABG with harvesting of the left radial artery along with a small piece of vein to anastomosis to the aorta.  He has agreed to proceed.   Findings: Small radial artery.  The saphenous vein was anastomosed to the proximal end of the arterial graft in and end to end fashion, and this was connected to the aorta.  Calcified PDA.  Operative Technique: After the risks, benefits and alternatives were thoroughly discussed, the patient was brought to the operative theatre.  All invasive lines were placed in pre-op holding.  Anesthesia was induced, and the patient was prepped and draped in normal sterile fashion.  An appropriate surgical pause was performed, and pre-operative antibiotics were dosed accordingly.  We began with an incision along the left arm for harvesting the radial  artery.  It appeared small, so a small incision was made along the right leg to harvest a small piece of the greater saphenous vein.  This was then connected to the proximal end of the radial artery graft in an end to end fashion.  In regards to the sternotomy, this was carried down with bovie cautery, and the sternum was divided with a reciprocating saw.  Meticulous hemostasis was obtained.  The patient was systemically heparinized.  The retractor was placed.  The pericardium was divided in the midline and fashioned into a cradle with pericardial stitches.  We next exposed the posterior wall and identified a good target on PDA.  An end to side anastomosis between it and arterial graft.  Meticulous hemostasis was obtained.    A partial occludding clamp was then placed on the ascending aorta, and we created an end to side anastomosis between it and the proximal portion of vein graft connected the arterial graft.  The proximal site marked with ring.  Hemostasis was obtained, and the heparin  was reversed with protamine .  Chest tubes and wires were placed, and the sternum was re-approximated with with sternal wires.  The soft tissue and skin were re-approximated wth absorbable suture.    The patient tolerated  the procedure without any immediate complications, and was transferred to the ICU in guarded condition.  [name]

## 2024-01-29 NOTE — Anesthesia Procedure Notes (Signed)
Arterial Line Insertion Performed by: Marci Polito B, CRNA, CRNA  Patient location: Pre-op. Preanesthetic checklist: patient identified, IV checked, site marked, risks and benefits discussed, surgical consent, monitors and equipment checked, pre-op evaluation, timeout performed and anesthesia consent Lidocaine 1% used for infiltration Right, radial was placed Catheter size: 20 G Hand hygiene performed , maximum sterile barriers used  and Seldinger technique used Allen's test indicative of satisfactory collateral circulation Attempts: 1 Procedure performed without using ultrasound guided technique. Following insertion, dressing applied and Biopatch. Post procedure assessment: normal  Patient tolerated the procedure well with no immediate complications.    

## 2024-01-29 NOTE — Procedures (Signed)
 Extubation Procedure Note  Patient Details:   Name: BERNAL LUHMAN DOB: 04/06/1966 MRN: [id number]   Airway Documentation:  Airway 8 mm (Active)  Secured at (cm) 21 cm 01/29/24 1440  Measured From Lips 01/29/24 1440  Secured Location Right 01/29/24 1440  Secured By [employer] Tape 01/29/24 1440  Bite Block No 01/29/24 1440  Prone position No 01/29/24 1440  Cuff Pressure (cm H2O) Clear OR 27-39 CmH2O 01/29/24 1440  Site Condition Dry 01/29/24 1440   Vent end date: (not recorded) Vent end time: (not recorded)   Evaluation  O2 sats: stable throughout Complications: No apparent complications Patient did tolerate procedure well. Bilateral Breath Sounds: Clear   Yes  SICU rapid wean protocol aborted & patient was extuabted to a 6L [place] per CCM order due to severe agitation. Positive cuff leak prior to extubation. Dr. Claudene notified after extubation due to continued agitation.   Briar Sword, Rosina LITTIE 01/29/2024, 3:06 PM

## 2024-01-29 NOTE — Telephone Encounter (Signed)
 STD form completed and faxed to [facility] @[phone]. Beginning LOA 01/29/24 through 04/26/24. DOS 01/29/24

## 2024-01-30 ENCOUNTER — Inpatient Hospital Stay (HOSPITAL_COMMUNITY)

## 2024-01-30 ENCOUNTER — Encounter (HOSPITAL_COMMUNITY): Payer: Self-pay | Admitting: Thoracic Surgery (Cardiothoracic Vascular Surgery)

## 2024-01-30 DIAGNOSIS — Z951 Presence of aortocoronary bypass graft: Secondary | ICD-10-CM | POA: Diagnosis not present

## 2024-01-30 DIAGNOSIS — I2510 Atherosclerotic heart disease of native coronary artery without angina pectoris: Secondary | ICD-10-CM

## 2024-01-30 DIAGNOSIS — I959 Hypotension, unspecified: Secondary | ICD-10-CM | POA: Diagnosis not present

## 2024-01-30 LAB — GLUCOSE, CAPILLARY
Glucose-Capillary: 105 mg/dL — ABNORMAL HIGH (ref 70–99)
Glucose-Capillary: 108 mg/dL — ABNORMAL HIGH (ref 70–99)
Glucose-Capillary: 110 mg/dL — ABNORMAL HIGH (ref 70–99)
Glucose-Capillary: 112 mg/dL — ABNORMAL HIGH (ref 70–99)
Glucose-Capillary: 119 mg/dL — ABNORMAL HIGH (ref 70–99)
Glucose-Capillary: 121 mg/dL — ABNORMAL HIGH (ref 70–99)
Glucose-Capillary: 135 mg/dL — ABNORMAL HIGH (ref 70–99)
Glucose-Capillary: 137 mg/dL — ABNORMAL HIGH (ref 70–99)
Glucose-Capillary: 140 mg/dL — ABNORMAL HIGH (ref 70–99)
Glucose-Capillary: 96 mg/dL (ref 70–99)
Glucose-Capillary: 97 mg/dL (ref 70–99)
Glucose-Capillary: 99 mg/dL (ref 70–99)

## 2024-01-30 LAB — LIPID PANEL
Cholesterol: 62 mg/dL (ref 0–200)
HDL: 29 mg/dL — ABNORMAL LOW (ref >40)
LDL Cholesterol: 24 mg/dL (ref 0–99)
Total CHOL/HDL Ratio: 2.1 ratio
Triglycerides: 45 mg/dL (ref <150)
VLDL: 9 mg/dL (ref 0–40)

## 2024-01-30 LAB — BASIC METABOLIC PANEL WITH GFR
Anion gap: 7 (ref 5–15)
Anion gap: 5 (ref 5–15)
BUN: 12 mg/dL (ref 6–20)
BUN: 13 mg/dL (ref 6–20)
CO2: 25 mmol/L (ref 22–32)
CO2: 23 mmol/L (ref 22–32)
Calcium: 8.1 mg/dL — ABNORMAL LOW (ref 8.9–10.3)
Calcium: 7.6 mg/dL — ABNORMAL LOW (ref 8.9–10.3)
Chloride: 104 mmol/L (ref 98–111)
Chloride: 108 mmol/L (ref 98–111)
Creatinine, Ser: 0.91 mg/dL (ref 0.61–1.24)
Creatinine, Ser: 0.71 mg/dL (ref 0.61–1.24)
GFR, Estimated: >60 mL/min (ref >60)
GFR, Estimated: >60 mL/min (ref >60)
Glucose, Bld: 121 mg/dL — ABNORMAL HIGH (ref 70–99)
Glucose, Bld: 133 mg/dL — ABNORMAL HIGH (ref 70–99)
Potassium: 3.6 mmol/L (ref 3.5–5.1)
Potassium: 4 mmol/L (ref 3.5–5.1)
Sodium: 136 mmol/L (ref 135–145)
Sodium: 136 mmol/L (ref 135–145)

## 2024-01-30 LAB — CBC
HCT: 34.3 % — ABNORMAL LOW (ref 39.0–52.0)
HCT: 36.4 % — ABNORMAL LOW (ref 39.0–52.0)
HCT: 38 % — ABNORMAL LOW (ref 39.0–52.0)
Hemoglobin: 11.9 g/dL — ABNORMAL LOW (ref 13.0–17.0)
Hemoglobin: 12.2 g/dL — ABNORMAL LOW (ref 13.0–17.0)
Hemoglobin: 12.6 g/dL — ABNORMAL LOW (ref 13.0–17.0)
MCH: 32.1 pg (ref 26.0–34.0)
MCH: 31.2 pg (ref 26.0–34.0)
MCH: 31.8 pg (ref 26.0–34.0)
MCHC: 34.7 g/dL (ref 30.0–36.0)
MCHC: 33.5 g/dL (ref 30.0–36.0)
MCHC: 33.2 g/dL (ref 30.0–36.0)
MCV: 92.5 fL (ref 80.0–100.0)
MCV: 93.1 fL (ref 80.0–100.0)
MCV: 96 fL (ref 80.0–100.0)
Platelets: 192 K/uL (ref 150–400)
Platelets: 186 K/uL (ref 150–400)
Platelets: 180 K/uL (ref 150–400)
RBC: 3.71 MIL/uL — ABNORMAL LOW (ref 4.22–5.81)
RBC: 3.91 MIL/uL — ABNORMAL LOW (ref 4.22–5.81)
RBC: 3.96 MIL/uL — ABNORMAL LOW (ref 4.22–5.81)
RDW: 12.9 % (ref 11.5–15.5)
RDW: 13.1 % (ref 11.5–15.5)
RDW: 13.2 % (ref 11.5–15.5)
WBC: 11 K/uL — ABNORMAL HIGH (ref 4.0–10.5)
WBC: 11.1 K/uL — ABNORMAL HIGH (ref 4.0–10.5)
WBC: 12.4 K/uL — ABNORMAL HIGH (ref 4.0–10.5)
nRBC: 0 % (ref 0.0–0.2)
nRBC: 0 % (ref 0.0–0.2)
nRBC: 0 % (ref 0.0–0.2)

## 2024-01-30 LAB — MAGNESIUM
Magnesium: 1.9 mg/dL (ref 1.7–2.4)
Magnesium: 1.9 mg/dL (ref 1.7–2.4)

## 2024-01-30 MED ORDER — AMLODIPINE BESYLATE 5 MG PO TABS
2.5000 mg | ORAL_TABLET | Freq: Every day | ORAL | Status: DC
  Administered 2024-01-30 – 2024-02-02 (×4): 2.5 mg via ORAL
  Filled 2024-01-30 (×4): qty 1

## 2024-01-30 MED ORDER — POTASSIUM CHLORIDE CRYS ER 20 MEQ PO TBCR
40.0000 meq | EXTENDED_RELEASE_TABLET | Freq: Once | ORAL | Status: AC
  Administered 2024-01-30: 40 meq via ORAL
  Filled 2024-01-30: qty 2

## 2024-01-30 MED ORDER — ORAL CARE MOUTH RINSE
15.0000 mL | OROMUCOSAL | Status: DC | PRN
Start: 2024-01-30 — End: 2024-02-02

## 2024-01-30 MED ORDER — MAGNESIUM SULFATE 2 GM/50ML IV SOLN
2.0000 g | Freq: Once | INTRAVENOUS | Status: AC
  Administered 2024-01-30: 2 g via INTRAVENOUS
  Filled 2024-01-30: qty 50

## 2024-01-30 MED ORDER — INSULIN ASPART 100 UNIT/ML IJ SOLN
0.0000 [IU] | INTRAMUSCULAR | Status: DC
  Administered 2024-01-30 (×2): 2 [IU] via SUBCUTANEOUS

## 2024-01-30 MED ORDER — ENOXAPARIN SODIUM 40 MG/0.4ML IJ SOSY
40.0000 mg | PREFILLED_SYRINGE | Freq: Every day | INTRAMUSCULAR | Status: DC
  Administered 2024-01-30 – 2024-02-01 (×3): 40 mg via SUBCUTANEOUS
  Filled 2024-01-30 (×3): qty 0.4

## 2024-01-30 MED ORDER — MAGNESIUM SULFATE 4 GM/100ML IV SOLN
2.0000 g | Freq: Once | INTRAVENOUS | Status: DC
  Filled 2024-01-30: qty 100

## 2024-01-30 NOTE — Discharge Summary (Signed)
 [address] [place] [address]             [phone]        Physician Discharge Summary  Patient ID: Phillip Clark MRN: [id number] DOB/AGE: 22-Oct-1965 58 y.o.  Admit date: 01/29/2024 Discharge date: 02/02/2024  Admission Diagnoses:  Patient Active Problem List   Diagnosis Date Noted   Angina pectoris (HCC) 01/01/2024   Unstable angina (HCC) 09/29/2020   Pure hypercholesterolemia 09/29/2020   Premature ventricular contractions    CAD (coronary artery disease)    Atherosclerosis of aorta (HCC)    Former smoker    Family history of premature CAD    Precordial pain    Dyspnea on exertion    Discharge Diagnoses:  Patient Active Problem List   Diagnosis Date Noted   S/P CABG (coronary artery bypass graft) 01/29/2024   H/O open surgical procurement of radial artery 01/29/2024   S/P CABG x 1 01/29/2024   Angina pectoris (HCC) 01/01/2024   Unstable angina (HCC) 09/29/2020   Pure hypercholesterolemia 09/29/2020   Premature ventricular contractions    CAD (coronary artery disease)    Atherosclerosis of aorta (HCC)    Former smoker    Family history of premature CAD    Precordial pain    Dyspnea on exertion    Discharged Condition: good  History of Present Illness:  Phillip Clark is a 58 yo male with known history of HLD, HTN, nicotine abuse and CAD.  He recently developed complaints of typical exertional angina.  He was treated initially with medical therapy but his symptoms persisted.  Due to this PET myocardial perfusion was obtained which showed a large ischemic area int he apical to basal inferior, infraseptal, and lateral walls.  He underwent repeat catheterization which showed occlusion of the mid RCA with left to right collaterals to the PDA and PL Branches.  He was referred to Dr. Swaziland for possible PCI evaluation.  This was attempted on 01/01/24 which was unsuccessful due to inability to cross the lesions.  Due to this coronary  bypass grafting procedure was recommended and the patient was referred to [facility].  He was evaluated by Dr. Shyrl who felt patient was a reasonable surgical candidate.  The risks and benefits of the procedure were explained to the patient and he was agreeable to proceed.  Hospital Course:  Phillip Clark presented to [facility] on 01/29/2024.  He was taken to the operating room and underwent CABG x 1 utilizing radial artery to PDA.  He also underwent open left radial artery harvest and open harvest of saphenous vein from the right lower leg.  He tolerated the procedure without difficulty and was taken to the SICU in stable condition.  He was extubated the evening of surgery.  He was weaned off Levophed  as hemodynamics allowed.  He was transitioned off Cardene  to Norvasc  for his radial artery.  He underwent removal of arterial lines, pacing wires, and chest tubes without difficulty.  He was maintaining NSR and felt stable for transfer to the progressive care unit on 01/31/2024.  His lopressor  was titrated for PVCs.  He was on lasix  to facilitate diuresis. He was under his preoperative weight and his blood pressure was marginal, Lasix  was discontinued as discussed with Dr. Lucas. His bowels began moving appropriately. He developed mild hypokalemia, potassium was supplemented. He is ambulating without difficulty on room air. His surgical incisions  are healing without evidence of infection.  He was felt stable for discharge home today.  Consults: pulmonary/intensive care  Significant Diagnostic Studies: angiography:   Coronary angiography 09/26/2023: LM: No significant disease LAD: Prox-mid 20-30% disease Lcx: Prox Lcx, OM1 30% disease RCA: Mid CTO after RV marginal branch           Minimal right-to-right, robust left-to--right collaterals from LAD, both septal and epicardial   LVEDP 22 mmHg Recommend uptitration of ant anginal therapy and refer to Dr. Swaziland for  CTO revascularization.   Manish JINNY Lawrence, MD     Mid LAD lesion is 30% stenosed.   Prox LAD lesion is 20% stenosed.   Prox Cx lesion is 30% stenosed.   Prox RCA lesion is 20% stenosed.   Dist RCA lesion is 20% stenosed.   1st Mrg lesion is 30% stenosed.   Mid RCA lesion is 100% stenosed.   Post intervention, there is a 100% residual stenosis.   The radiation dose exceeded thresholds defined in the Patient Radiation Dose Management For Interventional Medical Procedures With Extensive Use of Fluoroscopy policy. Specific follow up instructions will be provided to the patient prior to discharge.   on 01/02/2024.   Unsuccessful CTO of the RCA using antegrade and retrograde approaches. Unable to cross with wire.   Plan: will add Ranexa  to medical therapy. Options include referral to another CTO operator or consideration of CABG. Will discuss with patient  Treatments: surgery:   01/29/2024    Patient:  Phillip Clark Pre-Op Dx: Isolated RCA coronary artery disease   Post-op Dx:  same Procedure: Off pump CABG X 1.  Left radial artery to PDA Open left radial artery harvest Open right greater saphenous vein harvest     Surgeon and Role:      * Lightfoot, Linnie KIDD, MD - Primary    * E. Kareena Arrambide, PA-C - assisting             An experienced assistant was required given the complexity of this surgery and the standard of surgical care. The assistant was needed for exposure, dissection, suctioning, retraction of delicate tissues and sutures, instrument exchange and for overall help during this procedure.    Discharge Exam: Blood pressure 110/82, pulse 96, temperature 98.2 F (36.8 C), temperature source Oral, resp. rate 20, height 6' (1.829 m), weight 81.7 kg, SpO2 96%. General appearance: alert, cooperative, and no distress Neurologic: intact Heart: regular rate and rhythm, PVCs, no murmur Lungs: clear to auscultation bilaterally Abdomen: soft, non-tender; bowel sounds normal; no  masses,  no organomegaly Extremities: extremities normal, atraumatic, no cyanosis or edema. Left hand neurovascularly intact Wound: Clean and dry without sign of infection  Discharge Medications:  The patient has been discharged on:   1.Beta Blocker:  Yes [  X ]                              No   [   ]                              If No, reason:  2.Ace Inhibitor/ARB: Yes [   ]                                     No  [  x  ]  If No, reason: labile BP  3.Statin:   Yes Galerius.Gant   ]                  No  [   ]                  If No, reason:  4.Ecasa:  Yes  [ X  ]                  No   [   ]                  If No, reason:  Patient had ACS upon admission: No  Plavix /P2Y12 inhibitor: Yes [   ]                                      No  [ X  ]     Discharge Instructions     Amb Referral to Cardiac Rehabilitation   Complete by: As directed    Diagnosis: CABG   CABG X ___: 1   After initial evaluation and assessments completed: Virtual Based Care may be provided alone or in conjunction with Phase 2 Cardiac Rehab based on patient barriers.: Yes   Intensive Cardiac Rehabilitation (ICR) [facility] location only OR Traditional Cardiac Rehabilitation (TCR) *If criteria for ICR are not met will enroll in TCR ([facility] only): Yes      Allergies as of 02/02/2024       Reactions   Latex Rash   Severe   Crestor [rosuvastatin Calcium ] Other (See Comments)   MYALGIAS   Rosuvastatin Other (See Comments)   Other reaction(s): Myalgias (intolerance)   Other Other (See Comments)   Steroid/Caused blood Glucose to increase   Topiramate Other (See Comments)   Tingling legs   Varenicline Other (See Comments)   Homicidal Ideations   Tape Rash   takes skins off        Medication List     STOP taking these medications    isosorbide  mononitrate 30 MG 24 hr tablet Commonly known as: IMDUR    metoprolol  succinate 100 MG 24 hr tablet Commonly known as: TOPROL -XL    nitroGLYCERIN  0.4 MG SL tablet Commonly known as: NITROSTAT    ranolazine  500 MG 12 hr tablet Commonly known as: Ranexa        TAKE these medications    acetaminophen  325 MG tablet Commonly known as: Tylenol  Take 2 tablets (650 mg total) by mouth every 6 (six) hours as needed.   amLODipine  2.5 MG tablet Commonly known as: NORVASC  Take 1 tablet (2.5 mg total) by mouth daily. Complete prescription but you will not require refill   aspirin  EC 325 MG tablet Take 1 tablet (325 mg total) by mouth daily. What changed:  medication strength how much to take additional instructions   atorvastatin  40 MG tablet Commonly known as: LIPITOR TAKE 1 TABLET BY MOUTH AT BEDTIME   ezetimibe  10 MG tablet Commonly known as: ZETIA  Take 1 tablet by mouth once daily   Fish Oil 1000 MG Caps Take 1,000 mg by mouth daily.   Jardiance  25 MG Tabs tablet Generic drug: empagliflozin  Take 25 mg by mouth daily.   magnesium  oxide 400 MG tablet Commonly known as: MAG-OX Take 400 mg by mouth daily.   metoprolol  tartrate 25 MG tablet Commonly known as: LOPRESSOR  Take 1 tablet (25 mg total) by mouth 2 (  two) times daily.   MULTIVITAMIN ADULT PO Take 1 tablet by mouth daily.   oxyCODONE  5 MG immediate release tablet Commonly known as: Oxy IR/ROXICODONE  Take 1 tablet (5 mg total) by mouth every 6 (six) hours as needed for severe pain (pain score 7-10).        Follow-up Information     [name], MD Follow up on 02/13/2024.   Specialty: Cardiothoracic Surgery Why: This is a virtual appointment @ 2:40.  You do not need to come the office Dr. Shyrl will call you Contact information: [address] [place] [address] [phone]         Lelon Hamilton T, PA-C Follow up on 02/23/2024.   Specialties: Cardiology, Physician Assistant Why: Cardiology appointment is at 8:25AM Contact information: [address] [place] [address] [phone]                  Signed:  Con GORMAN Bend, PA-C  02/02/2024, 12:10 PM

## 2024-01-30 NOTE — Discharge Instructions (Signed)

## 2024-01-30 NOTE — Progress Notes (Signed)
      [address]       [place] [address]             [phone]                 1 Day Post-Op Procedure(s) (LRB): OFF PUMP CORONARY ARTERY BYPASS GRAFTING X 1, USING HARVESTED LEFT RADIAL ARTERY  HARVESTED RIGHT SAPHENOUS VEIN (N/A) SURGICAL PROCUREMENT, ARTERY, RADIAL (Left) ECHOCARDIOGRAM, TRANSESOPHAGEAL, INTRAOPERATIVE (N/A)   Events: No events _______________________________________________________________ Vitals: BP 112/72   Pulse 88   Temp 98.7 F (37.1 C) (Oral)   Resp 18   Ht 6' (1.829 m)   Wt 86.1 kg   SpO2 95%   BMI 25.74 kg/m  Filed Weights   01/29/24 0551 01/30/24 0500  Weight: 82.6 kg 86.1 kg     - Neuro: alert NAD  - Cardiovascular: sinsu  Drips: levo and cardene .   CO:  [2.6 L/min-12.2 L/min] 10.9 L/min CI:  [1.3 L/min/m2-5.9 L/min/m2] 5.3 L/min/m2  - Pulm: EWOB    ABG    Component Value Date/Time   PHART 7.289 (L) 01/29/2024 1603   PCO2ART 40.0 01/29/2024 1603   PO2ART 72 (L) 01/29/2024 1603   HCO3 19.0 (L) 01/29/2024 1603   TCO2 20 (L) 01/29/2024 1603   ACIDBASEDEF 7.0 (H) 01/29/2024 1603   O2SAT 91 01/29/2024 1603    - Abd: ND - Extremity: warm  .Intake/Output      07/17 0701 07/18 0700 07/18 0701 07/19 0700   I.V. (mL/kg) 3825.7 (44.4) 195.4 (2.3)   IV Piggyback 2309.5 50   Total Intake(mL/kg) 6135.2 (71.3) 245.4 (2.9)   Urine (mL/kg/hr) 2683 (1.3) 400 (0.5)   Blood 200    Chest Tube 810 370   Total Output 3693 770   Net +2442.2 -524.6           _______________________________________________________________ Labs:    Latest Ref Rng & Units 01/30/2024    7:50 AM 01/30/2024    4:33 AM 01/29/2024    6:25 PM  CBC  WBC 4.0 - 10.5 K/uL 11.1  11.0  9.6   Hemoglobin 13.0 - 17.0 g/dL 87.7  88.0  88.5   Hematocrit 39.0 - 52.0 % 36.4  34.3  33.3   Platelets 150 - 400 K/uL 186  192  185       Latest Ref Rng & Units 01/30/2024    4:33 AM 01/29/2024    6:25 PM 01/29/2024    4:03 PM  CMP  Glucose 70 - 99  mg/dL 878  889    BUN 6 - 20 mg/dL 12  16    Creatinine 9.38 - 1.24 mg/dL 9.08  9.13    Sodium 864 - 145 mmol/L 136  141  144   Potassium 3.5 - 5.1 mmol/L 3.6  3.7  3.6   Chloride 98 - 111 mmol/L 104  111    CO2 22 - 32 mmol/L 25  25    Calcium  8.9 - 10.3 mg/dL 8.1  7.7      CXR: PV congestion  _______________________________________________________________  Assessment and Plan: POD 1 s/p CABG, radial  Neuro: pain controlled CV: wean gtt, start amlodipine Pulm: IS, ambulation Renal: creat stable GI: on diet Heme: stable ID: afebrile Endo: SSI Dispo: continue ICU care   Endya Austin O Terena Bohan 01/30/2024 4:10 PM

## 2024-01-30 NOTE — Consult Note (Signed)
 NAME:  Phillip Clark, MRN:  [id number], DOB:  1966/03/23, LOS: 1 ADMISSION DATE:  01/29/2024, CONSULTATION DATE:  01/29/2024 REFERRING MD:  Shyrl MA CHIEF COMPLAINT:  s/p CABG   History of Present Illness:  59 year old male with past medical history of CAD, diabetes, hyperlipidemia presenting to [facility] for CABG. Evaluated by TCTS, for single-vessel CAD. He had attempted PCI of RCA which was unsuccessful. Patient has had ongoing, progressively worsening shortness of breath and chest pain over the past two years.   Off pump CABG x1 7/17 with radial artery and saphenous vein harvest EBL: 200   Pertinent  Medical History  CAD, diabetes, hyperlipidemia   Significant Hospital Events: Including procedures, antibiotic start and stop dates in addition to other pertinent events   7/17: CABG x1 off pump. To ICU post-operatively   Interim History / Subjective:  Patient was successfully extubated per rapid weaning protocol Complaining of chest tube site chest pain, denies shortness of breath Still requiring low-dose Levophed  currently on 3 mics  Objective   Blood pressure 118/75, pulse 80, temperature 97.7 F (36.5 C), temperature source Axillary, resp. rate 17, height 6' (1.829 m), weight 86.1 kg, SpO2 93%. CVP:  [0 mmHg-8 mmHg] 0 mmHg CO:  [2.6 L/min-13.4 L/min] 6.8 L/min CI:  [1.3 L/min/m2-6.5 L/min/m2] 3.3 L/min/m2  Vent Mode: SIMV;PRVC;PSV FiO2 (%):  [40 %-50 %] 40 % Set Rate:  [4 bmp-16 bmp] 4 bmp Vt Set:  [620 mL] 620 mL PEEP:  [5 cmH20] 5 cmH20 Pressure Support:  [10 cmH20] 10 cmH20 Plateau Pressure:  [17 cmH20] 17 cmH20   Intake/Output Summary (Last 24 hours) at 01/30/2024 0800 Last data filed at 01/30/2024 0700 Gross per 24 hour  Intake 6135.2 ml  Output 3693 ml  Net 2442.2 ml   Filed Weights   01/29/24 0551 01/30/24 0500  Weight: 82.6 kg 86.1 kg    Examination: General: Middle-aged male, lying on the bed HEENT:  [place]/AT, eyes anicteric.  moist mucus  membranes Neuro: Alert, awake following commands Chest: Central sternum incision looks clean and dry, coarse breath sounds, no wheezes or rhonchi.  Pacer wire, mediastinal chest tubes in place Heart: Regular rate and rhythm, no murmurs or gallops Abdomen: Soft, nontender, nondistended, bowel sounds present  Labs and images reviewed  Patient Lines/Drains/Airways Status     Active Line/Drains/Airways     Name Placement date Placement time Site Days   Arterial Line 01/29/24 Right Radial 01/29/24  0902  Radial  1   Peripheral IV 16 G Right Antecubital --  --  Antecubital  --   Urethral Catheter LOIS Stacks RN Double-lumen;Non-latex 16 Fr. 01/29/24  0815  Double-lumen;Non-latex  1   Y Chest Tube 1 and 2 1 Right Pleural 19 Fr. Medial Mediastinal 19 Fr. 01/29/24  1053  -- 1   Wound 01/29/24 0942 Surgical Closed Surgical Incision Chest Other (Comment) 01/29/24  0942  Chest  1   Wound 01/29/24 0942 Surgical Closed Surgical Incision Leg Right 01/29/24  0942  Leg  1   Wound 01/29/24 0942 Surgical Closed Surgical Incision Arm Left 01/29/24  0942  Arm  1        Resolved Hospital Problem list    Assessment & Plan:  Coronary artery disease status post unsuccessful PCI to RCA, s/p off-pump CABG x 1 Postop vasoplegia Continue aspirin , Zetia  and statin Chest tube management TCTS Continue pain control with tramadol , oxycodone  and morphine  Closely monitor chest tube output On Cardene  at 1 mg/h for radial artery harvest Hold beta-blocker  until off low-dose Levophed  Vasopressor requirement is improving, overnight he was on 13 mics of Levophed , currently on 3 mics  Acute respiratory insufficiency, postop Patient was successfully extubated per rapid weaning protocol Continue to titrate nasal cannula oxygen Encourage incentive spirometry and ambulation  Hypertension Holding antihypertensive for now, as patient is requiring low-dose Levophed   Hyperlipidemia Continue atorvastatin   Diabetes type  2 Patient hemoglobin A1c is 5.5 Will transition insulin  infusion to sliding scale Monitor fingerstick with goal 140-180  Expected perioperative blood loss anemia Monitor H/H  Hypokalemia Continue aggressive electrolyte replacement  Best Practice (right click and Reselect all SmartList Selections daily)   Diet/type: Clear liquid diet, advance as tolerated DVT prophylaxis: SCD GI prophylaxis: PPI Lines: Central line, Arterial Line, and yes and it is still needed Foley:  Yes, and it is still needed Code Status:  full code Last date of multidisciplinary goals of care discussion: Per primary team  Labs   CBC: Recent Labs  Lab 01/28/24 0835 01/29/24 0825 01/29/24 1117 01/29/24 1217 01/29/24 1603 01/29/24 1825 01/30/24 0433  WBC 9.2  --   --  9.7  --  9.6 11.0*  HGB 16.0   < > 11.6* 10.9*  13.3 10.5* 11.4* 11.9*  HCT 47.0   < > 34.0* 32.0*  39.0 31.0* 33.3* 34.3*  MCV 93.3  --   --  92.4  --  92.2 92.5  PLT 255  --   --  195  --  185 192   < > = values in this interval not displayed.    Basic Metabolic Panel: Recent Labs  Lab 01/28/24 0835 01/29/24 0825 01/29/24 1021 01/29/24 1117 01/29/24 1217 01/29/24 1600 01/29/24 1603 01/29/24 1825 01/30/24 0433  NA 138   < > 142 143 145 139 144 141 136  K 4.3   < > 3.9 4.1 3.3* 3.8 3.6 3.7 3.6  CL 101   < > 111 111  --  108  --  111 104  CO2 27  --   --   --   --  23  --  25 25  GLUCOSE 93   < > 119* 133*  --  104*  --  110* 121*  BUN 24*   < > 18 19  --  18  --  16 12  CREATININE 1.14   < > 0.70 0.70  --  0.88  --  0.86 0.91  CALCIUM  9.4  --   --   --   --  7.8*  --  7.7* 8.1*  MG  --   --   --   --   --  2.4  --  2.2 1.9   < > = values in this interval not displayed.   GFR: Estimated Creatinine Clearance: 99.5 mL/min (by C-G formula based on SCr of 0.91 mg/dL). Recent Labs  Lab 01/28/24 0835 01/29/24 1217 01/29/24 1825 01/30/24 0433  WBC 9.2 9.7 9.6 11.0*    Liver Function Tests: Recent Labs  Lab  01/28/24 0835  AST 24  ALT 27  ALKPHOS 69  BILITOT 0.7  PROT 7.2  ALBUMIN  4.2   No results for input(s): LIPASE, AMYLASE in the last 168 hours. No results for input(s): AMMONIA in the last 168 hours.  ABG    Component Value Date/Time   PHART 7.289 (L) 01/29/2024 1603   PCO2ART 40.0 01/29/2024 1603   PO2ART 72 (L) 01/29/2024 1603   HCO3 19.0 (L) 01/29/2024 1603   TCO2 20 (L) 01/29/2024 1603  ACIDBASEDEF 7.0 (H) 01/29/2024 1603   O2SAT 91 01/29/2024 1603     Coagulation Profile: Recent Labs  Lab 01/28/24 0835 01/29/24 1217  INR 1.0 1.3*    Cardiac Enzymes: No results for input(s): CKTOTAL, CKMB, CKMBINDEX, TROPONINI in the last 168 hours.  HbA1C: Hgb A1c MFr Bld  Date/Time Value Ref Range Status  01/28/2024 08:35 AM 5.5 4.8 - 5.6 % Final    Comment:    (NOTE) Diagnosis of Diabetes The following HbA1c ranges recommended by the American Diabetes Association (ADA) may be used as an aid in the diagnosis of diabetes mellitus.  Hemoglobin             Suggested A1C NGSP%              Diagnosis  <5.7                   Non Diabetic  5.7-6.4                Pre-Diabetic  >6.4                   Diabetic  <7.0                   Glycemic control for                       adults with diabetes.    02/09/2021 09:16 PM 7.3 (H) 4.8 - 5.6 % Final    Comment:    (NOTE) Pre diabetes:          5.7%-6.4%  Diabetes:              >6.4%  Glycemic control for   <7.0% adults with diabetes     CBG: Recent Labs  Lab 01/30/24 0030 01/30/24 0234 01/30/24 0431 01/30/24 0644 01/30/24 0755  GLUCAP 110* 112* 121* 108* 99     The patient is critically ill due to coronary artery disease status post CABG x 1, postop vasoplegia requiring titration of vasopressors.  Critical care was necessary to treat or prevent imminent or life-threatening deterioration.  Critical care was time spent personally by me on the following activities: development of treatment plan  with patient and/or surrogate as well as nursing, discussions with consultants, evaluation of patient's response to treatment, examination of patient, obtaining history from patient or surrogate, ordering and performing treatments and interventions, ordering and review of laboratory studies, ordering and review of radiographic studies, pulse oximetry, re-evaluation of patient's condition and participation in multidisciplinary rounds.   During this encounter critical care time was devoted to patient care services described in this note for 36 minutes.   Valinda Novas, MD [facility] See Amion for pager If no response to pager, please call [phone] until 7pm After 7pm, Please call E-link [phone]

## 2024-01-30 NOTE — Progress Notes (Signed)
 Called Dr. Lightfoot to inform him that patient gave 20cc of CT output overnight, but after standing patient dumped 350cc of serosanguinous fluid, after laying back in bed 150cc continued to drain. Patient stable. VSS. No new orders.

## 2024-01-30 NOTE — Anesthesia Postprocedure Evaluation (Signed)
 Anesthesia Post Note  Patient: Phillip Clark  Procedure(s) Performed: OFF PUMP CORONARY ARTERY BYPASS GRAFTING X 1, USING HARVESTED LEFT RADIAL ARTERY  HARVESTED RIGHT SAPHENOUS VEIN (Chest) SURGICAL PROCUREMENT, ARTERY, RADIAL (Left: Arm Lower) ECHOCARDIOGRAM, TRANSESOPHAGEAL, INTRAOPERATIVE     Patient location during evaluation: SICU Anesthesia Type: General Level of consciousness: patient cooperative and awake and alert Pain management: pain level controlled Vital Signs Assessment: post-procedure vital signs reviewed and stable Respiratory status: spontaneous breathing Cardiovascular status: stable Anesthetic complications: no   No notable events documented.  Last Vitals:  Vitals:   01/30/24 1900 01/30/24 1930  BP: 108/72 113/74  Pulse: 82 74  Resp:    Temp:    SpO2: 94% 94%    Last Pain:  Vitals:   01/30/24 1953  TempSrc:   PainSc: 4                  Norleen Pope

## 2024-01-30 NOTE — Progress Notes (Signed)
 [facility] ADULT ICU REPLACEMENT PROTOCOL   The patient does apply for the [facility] Adult ICU Electrolyte Replacment Protocol based on the criteria listed below:   1.Exclusion criteria: TCTS, ECMO, Dialysis, and Myasthenia Gravis patients 2. Is GFR >/= 30 ml/min? Yes.    Patient's GFR today is >60 3. Is SCr </= 2? Yes.   Patient's SCr is 0.91 mg/dL 4. Did SCr increase >/= 0.5 in 24 hours? No. 5.Pt's weight >40kg  Yes.   6. Abnormal electrolyte(s): K+ = 3.6, Mg = 1.9  7. Electrolytes replaced per protocol 8.  Call MD STAT for K+ </= 2.5, Phos </= 1, or Mag </= 1 Physician:  Haze, eMD   Phillip Clark Hamilton 01/30/2024 5:43 AM

## 2024-01-31 ENCOUNTER — Inpatient Hospital Stay (HOSPITAL_COMMUNITY)

## 2024-01-31 DIAGNOSIS — I959 Hypotension, unspecified: Secondary | ICD-10-CM | POA: Diagnosis not present

## 2024-01-31 DIAGNOSIS — I2510 Atherosclerotic heart disease of native coronary artery without angina pectoris: Secondary | ICD-10-CM | POA: Diagnosis not present

## 2024-01-31 DIAGNOSIS — Z951 Presence of aortocoronary bypass graft: Secondary | ICD-10-CM | POA: Diagnosis not present

## 2024-01-31 LAB — GLUCOSE, CAPILLARY
Glucose-Capillary: 80 mg/dL (ref 70–99)
Glucose-Capillary: 126 mg/dL — ABNORMAL HIGH (ref 70–99)
Glucose-Capillary: 85 mg/dL (ref 70–99)
Glucose-Capillary: 101 mg/dL — ABNORMAL HIGH (ref 70–99)
Glucose-Capillary: 123 mg/dL — ABNORMAL HIGH (ref 70–99)

## 2024-01-31 LAB — BASIC METABOLIC PANEL WITH GFR
Anion gap: 7 (ref 5–15)
BUN: 13 mg/dL (ref 6–20)
CO2: 25 mmol/L (ref 22–32)
Calcium: 8.4 mg/dL — ABNORMAL LOW (ref 8.9–10.3)
Chloride: 102 mmol/L (ref 98–111)
Creatinine, Ser: 1.07 mg/dL (ref 0.61–1.24)
GFR, Estimated: >60 mL/min (ref >60)
Glucose, Bld: 92 mg/dL (ref 70–99)
Potassium: 4 mmol/L (ref 3.5–5.1)
Sodium: 134 mmol/L — ABNORMAL LOW (ref 135–145)

## 2024-01-31 LAB — CBC
HCT: 35.8 % — ABNORMAL LOW (ref 39.0–52.0)
Hemoglobin: 11.9 g/dL — ABNORMAL LOW (ref 13.0–17.0)
MCH: 31.7 pg (ref 26.0–34.0)
MCHC: 33.2 g/dL (ref 30.0–36.0)
MCV: 95.5 fL (ref 80.0–100.0)
Platelets: 152 K/uL (ref 150–400)
RBC: 3.75 MIL/uL — ABNORMAL LOW (ref 4.22–5.81)
RDW: 13.2 % (ref 11.5–15.5)
WBC: 9.4 K/uL (ref 4.0–10.5)
nRBC: 0 % (ref 0.0–0.2)

## 2024-01-31 LAB — MAGNESIUM: Magnesium: 1.9 mg/dL (ref 1.7–2.4)

## 2024-01-31 LAB — PHOSPHORUS: Phosphorus: 2.5 mg/dL (ref 2.5–4.6)

## 2024-01-31 MED ORDER — INSULIN ASPART 100 UNIT/ML IJ SOLN
0.0000 [IU] | Freq: Three times a day (TID) | INTRAMUSCULAR | Status: DC
  Administered 2024-02-01: 3 [IU] via SUBCUTANEOUS
  Administered 2024-02-02: 2 [IU] via SUBCUTANEOUS

## 2024-01-31 MED ORDER — ~~LOC~~ CARDIAC SURGERY, PATIENT & FAMILY EDUCATION
Freq: Once | Status: AC
  Administered 2024-01-31: 1

## 2024-01-31 MED ORDER — FUROSEMIDE 40 MG PO TABS
40.0000 mg | ORAL_TABLET | Freq: Every day | ORAL | Status: DC
  Administered 2024-01-31 – 2024-02-01 (×2): 40 mg via ORAL
  Filled 2024-01-31 (×3): qty 1

## 2024-01-31 MED ORDER — SODIUM CHLORIDE 0.9% FLUSH: 3.0000 mL | Freq: Two times a day (BID) | INTRAVENOUS | Status: DC

## 2024-01-31 MED ORDER — SODIUM CHLORIDE 0.9 % IV SOLN: 250.0000 mL | INTRAVENOUS | Status: DC | PRN

## 2024-01-31 MED ORDER — SODIUM CHLORIDE 0.9% FLUSH: 3.0000 mL | INTRAVENOUS | Status: DC | PRN

## 2024-01-31 NOTE — Progress Notes (Signed)
      [address]       [place] [address]             [phone]                 2 Days Post-Op Procedure(s) (LRB): OFF PUMP CORONARY ARTERY BYPASS GRAFTING X 1, USING HARVESTED LEFT RADIAL ARTERY  HARVESTED RIGHT SAPHENOUS VEIN (N/A) SURGICAL PROCUREMENT, ARTERY, RADIAL (Left) ECHOCARDIOGRAM, TRANSESOPHAGEAL, INTRAOPERATIVE (N/A)   Events: No events _______________________________________________________________ Vitals: BP 110/73 (BP Location: Right Arm)   Pulse 76   Temp 98.8 F (37.1 C) (Oral)   Resp 18   Ht 6' (1.829 m)   Wt 86.6 kg   SpO2 93%   BMI 25.89 kg/m  Filed Weights   01/29/24 0551 01/30/24 0500 01/31/24 0500  Weight: 82.6 kg 86.1 kg 86.6 kg     - Neuro: alert NAD  - Cardiovascular: sinsu  Drips: none   CO:  [9.3 L/min-10.9 L/min] 10.9 L/min CI:  [4.5 L/min/m2-5.3 L/min/m2] 5.3 L/min/m2  - Pulm: EWOB    ABG    Component Value Date/Time   PHART 7.289 (L) 01/29/2024 1603   PCO2ART 40.0 01/29/2024 1603   PO2ART 72 (L) 01/29/2024 1603   HCO3 19.0 (L) 01/29/2024 1603   TCO2 20 (L) 01/29/2024 1603   ACIDBASEDEF 7.0 (H) 01/29/2024 1603   O2SAT 91 01/29/2024 1603    - Abd: ND - Extremity: warm  .Intake/Output      07/18 0701 07/19 0700 07/19 0701 07/20 0700   P.O.  120   I.V. (mL/kg) 195.4 (2.3)    IV Piggyback 350    Total Intake(mL/kg) 545.4 (6.3) 120 (1.4)   Urine (mL/kg/hr) 500 (0.2) 300 (2.2)   Blood     Chest Tube 420 70   Total Output 920 370   Net -374.6 -250        Urine Occurrence  1 x      _______________________________________________________________ Labs:    Latest Ref Rng & Units 01/31/2024    4:33 AM 01/30/2024    4:11 PM 01/30/2024    7:50 AM  CBC  WBC 4.0 - 10.5 K/uL 9.4  12.4  11.1   Hemoglobin 13.0 - 17.0 g/dL 88.0  87.3  87.7   Hematocrit 39.0 - 52.0 % 35.8  38.0  36.4   Platelets 150 - 400 K/uL 152  180  186       Latest Ref Rng & Units 01/31/2024    4:33 AM 01/30/2024    4:11 PM  01/30/2024    4:33 AM  CMP  Glucose 70 - 99 mg/dL 92  866  878   BUN 6 - 20 mg/dL 13  13  12    Creatinine 0.61 - 1.24 mg/dL 8.92  9.28  9.08   Sodium 135 - 145 mmol/L 134  136  136   Potassium 3.5 - 5.1 mmol/L 4.0  4.0  3.6   Chloride 98 - 111 mmol/L 102  108  104   CO2 22 - 32 mmol/L 25  23  25    Calcium  8.9 - 10.3 mg/dL 8.4  7.6  8.1     CXR: PV congestion  _______________________________________________________________  Assessment and Plan: POD 2 s/p CABG, radial  Neuro: pain controlled CV: on amlodipine  Pulm: IS, ambulation Renal: creat stable GI: on diet Heme: stable ID: [email or web address] Endo: SSI Dispo: floor   Chinonso Linker O Dorleen Kissel 01/31/2024 8:34 AM

## 2024-01-31 NOTE — Progress Notes (Signed)
 NAME:  Phillip Clark, MRN:  [id number], DOB:  Feb 24, 1966, LOS: 2 ADMISSION DATE:  01/29/2024, CONSULTATION DATE:  01/29/2024 REFERRING MD:  Shyrl MA CHIEF COMPLAINT:  s/p CABG   History of Present Illness:  58 year old male with past medical history of CAD, diabetes, hyperlipidemia presenting to [facility] for CABG. Evaluated by TCTS, for single-vessel CAD. He had attempted PCI of RCA which was unsuccessful. Patient has had ongoing, progressively worsening shortness of breath and chest pain over the past two years.   Off pump CABG x1 7/17 with radial artery and saphenous vein harvest EBL: 200   Pertinent  Medical History  CAD, diabetes, hyperlipidemia   Significant Hospital Events: Including procedures, antibiotic start and stop dates in addition to other pertinent events   7/17: CABG x1 off pump. To ICU post-operatively   Interim History / Subjective:  Patient came off of vasopressor support Stated pain is better controlled today than yesterday Had good night sleep  Objective   Blood pressure 113/71, pulse 78, temperature 98.3 F (36.8 C), temperature source Oral, resp. rate 18, height 6' (1.829 m), weight 86.6 kg, SpO2 95%. CO:  [9.3 L/min-10.9 L/min] 10.9 L/min CI:  [4.5 L/min/m2-5.3 L/min/m2] 5.3 L/min/m2      Intake/Output Summary (Last 24 hours) at 01/31/2024 0734 Last data filed at 01/31/2024 0600 Gross per 24 hour  Intake 545.41 ml  Output 920 ml  Net -374.59 ml   Filed Weights   01/29/24 0551 01/30/24 0500 01/31/24 0500  Weight: 82.6 kg 86.1 kg 86.6 kg    Examination: General: Middle-aged male, sitting on recliner HEENT: [place]/AT, eyes anicteric.  moist mucus membranes Neuro: Alert, awake following commands Chest: Central sternotomy incision looks clean and dry.  Reduced air entry at the bases bilaterally, no wheezes or rhonchi.  Mediastinal, chest tube and pacer wires in place Heart: Regular rate and rhythm, no murmurs or gallops Abdomen: Soft, nontender,  nondistended, bowel sounds present  Labs and images reviewed  Patient Lines/Drains/Airways Status     Active Line/Drains/Airways     Name Placement date Placement time Site Days   Peripheral IV 16 G Right Antecubital --  --  Antecubital  --   Y Chest Tube 1 and 2 1 Right Pleural 19 Fr. Medial Mediastinal 19 Fr. 01/29/24  1053  -- 2   Wound 01/29/24 0942 Surgical Closed Surgical Incision Chest Other (Comment) 01/29/24  0942  Chest  2   Wound 01/29/24 0942 Surgical Closed Surgical Incision Leg Right 01/29/24  0942  Leg  2   Wound 01/29/24 0942 Surgical Closed Surgical Incision Arm Left 01/29/24  0942  Arm  2        Resolved Hospital Problem list   Postop vasoplegia Hypokalemia  Assessment & Plan:  Coronary artery disease status post unsuccessful PCI to RCA, s/p off-pump CABG x 1 Continue aspirin , Zetia  and statin Chest tube management TCTS Chest tube output was 420 cc in last 24 hours Pain is better controlled Continue pain control with tramadol , oxycodone  and morphine  Closely monitor chest tube output Continue amlodipine  2.5 mg for radial artery graft harvest Off vasopressors  Acute respiratory insufficiency, postop Continue to encourage incentive spirometry and ambulation Currently on 1.5 L of oxygen, titrate with O2 sat goal 92%  Hypertension Continue metoprolol   Hyperlipidemia Continue atorvastatin   Diabetes type 2 Patient hemoglobin A1c is 5.5 Continue sliding scale insulin , with CBG goal 140-180  Expected perioperative blood loss anemia Monitor H/H  Best Practice (right click and Reselect all SmartList Selections  daily)   Diet/type: Regular consistency DVT prophylaxis: Subcu Lovenox  GI prophylaxis: PPI Lines: Central line, will discontinue today Foley: Discontinued today Code Status:  full code Last date of multidisciplinary goals of care discussion: Per primary team  Labs   CBC: Recent Labs  Lab 01/29/24 1825 01/30/24 0433 01/30/24 0750  01/30/24 1611 01/31/24 0433  WBC 9.6 11.0* 11.1* 12.4* 9.4  HGB 11.4* 11.9* 12.2* 12.6* 11.9*  HCT 33.3* 34.3* 36.4* 38.0* 35.8*  MCV 92.2 92.5 93.1 96.0 95.5  PLT 185 192 186 180 152    Basic Metabolic Panel: Recent Labs  Lab 01/29/24 1600 01/29/24 1603 01/29/24 1825 01/30/24 0433 01/30/24 1611 01/31/24 0433  NA 139 144 141 136 136 134*  K 3.8 3.6 3.7 3.6 4.0 4.0  CL 108  --  111 104 108 102  CO2 23  --  25 25 23 25   GLUCOSE 104*  --  110* 121* 133* 92  BUN 18  --  16 12 13 13   CREATININE 0.88  --  0.86 0.91 0.71 1.07  CALCIUM  7.8*  --  7.7* 8.1* 7.6* 8.4*  MG 2.4  --  2.2 1.9 1.9  --    GFR: Estimated Creatinine Clearance: 84.6 mL/min (by C-G formula based on SCr of 1.07 mg/dL). Recent Labs  Lab 01/30/24 0433 01/30/24 0750 01/30/24 1611 01/31/24 0433  WBC 11.0* 11.1* 12.4* 9.4    Liver Function Tests: Recent Labs  Lab 01/28/24 0835  AST 24  ALT 27  ALKPHOS 69  BILITOT 0.7  PROT 7.2  ALBUMIN  4.2   No results for input(s): LIPASE, AMYLASE in the last 168 hours. No results for input(s): AMMONIA in the last 168 hours.  ABG    Component Value Date/Time   PHART 7.289 (L) 01/29/2024 1603   PCO2ART 40.0 01/29/2024 1603   PO2ART 72 (L) 01/29/2024 1603   HCO3 19.0 (L) 01/29/2024 1603   TCO2 20 (L) 01/29/2024 1603   ACIDBASEDEF 7.0 (H) 01/29/2024 1603   O2SAT 91 01/29/2024 1603     Coagulation Profile: Recent Labs  Lab 01/28/24 0835 01/29/24 1217  INR 1.0 1.3*    Cardiac Enzymes: No results for input(s): CKTOTAL, CKMB, CKMBINDEX, TROPONINI in the last 168 hours.  HbA1C: Hgb A1c MFr Bld  Date/Time Value Ref Range Status  01/28/2024 08:35 AM 5.5 4.8 - 5.6 % Final    Comment:    (NOTE) Diagnosis of Diabetes The following HbA1c ranges recommended by the American Diabetes Association (ADA) may be used as an aid in the diagnosis of diabetes mellitus.  Hemoglobin             Suggested A1C NGSP%              Diagnosis  <5.7                    Non Diabetic  5.7-6.4                Pre-Diabetic  >6.4                   Diabetic  <7.0                   Glycemic control for                       adults with diabetes.    02/09/2021 09:16 PM 7.3 (H) 4.8 - 5.6 % Final    Comment:    (NOTE)  Pre diabetes:          5.7%-6.4%  Diabetes:              >6.4%  Glycemic control for   <7.0% adults with diabetes     CBG: Recent Labs  Lab 01/30/24 1121 01/30/24 1608 01/30/24 1936 01/30/24 2325 01/31/24 0338  GLUCAP 105* 135* 119* 140* 80     Valinda Novas, MD [facility] See Amion for pager If no response to pager, please call [phone] until 7pm After 7pm, Please call E-link [phone]

## 2024-02-01 LAB — BASIC METABOLIC PANEL WITH GFR
Anion gap: 9 (ref 5–15)
BUN: 14 mg/dL (ref 6–20)
CO2: 26 mmol/L (ref 22–32)
Calcium: 8.4 mg/dL — ABNORMAL LOW (ref 8.9–10.3)
Chloride: 103 mmol/L (ref 98–111)
Creatinine, Ser: 0.86 mg/dL (ref 0.61–1.24)
GFR, Estimated: >60 mL/min (ref >60)
Glucose, Bld: 109 mg/dL — ABNORMAL HIGH (ref 70–99)
Potassium: 3.5 mmol/L (ref 3.5–5.1)
Sodium: 138 mmol/L (ref 135–145)

## 2024-02-01 LAB — CBC
HCT: 35.8 % — ABNORMAL LOW (ref 39.0–52.0)
Hemoglobin: 12.3 g/dL — ABNORMAL LOW (ref 13.0–17.0)
MCH: 31.3 pg (ref 26.0–34.0)
MCHC: 34.4 g/dL (ref 30.0–36.0)
MCV: 91.1 fL (ref 80.0–100.0)
Platelets: 163 K/uL (ref 150–400)
RBC: 3.93 MIL/uL — ABNORMAL LOW (ref 4.22–5.81)
RDW: 12.5 % (ref 11.5–15.5)
WBC: 7.1 K/uL (ref 4.0–10.5)
nRBC: 0 % (ref 0.0–0.2)

## 2024-02-01 LAB — GLUCOSE, CAPILLARY
Glucose-Capillary: 190 mg/dL — ABNORMAL HIGH (ref 70–99)
Glucose-Capillary: 107 mg/dL — ABNORMAL HIGH (ref 70–99)
Glucose-Capillary: 101 mg/dL — ABNORMAL HIGH (ref 70–99)
Glucose-Capillary: 115 mg/dL — ABNORMAL HIGH (ref 70–99)

## 2024-02-01 MED ORDER — METOPROLOL TARTRATE 25 MG PO TABS
25.0000 mg | ORAL_TABLET | Freq: Two times a day (BID) | ORAL | Status: DC
  Administered 2024-02-01 – 2024-02-02 (×3): 25 mg via ORAL
  Filled 2024-02-01 (×3): qty 1

## 2024-02-01 NOTE — Progress Notes (Addendum)
      [address] [place] [employer] [address]             [phone]         3 Days Post-Op Procedure(s) (LRB): OFF PUMP CORONARY ARTERY BYPASS GRAFTING X 1, USING HARVESTED LEFT RADIAL ARTERY  HARVESTED RIGHT SAPHENOUS VEIN (N/A) SURGICAL PROCUREMENT, ARTERY, RADIAL (Left) ECHOCARDIOGRAM, TRANSESOPHAGEAL, INTRAOPERATIVE (N/A)  Subjective:  Patient doing very well.  He is hoping to go home tomorrow.  He denies parasthesia from RA harvest site.  He is ambulating w/o difficulty.  He has not yet moved his bowels, but is passing gas.  Objective: Vital signs in last 24 hours: Temp:  [98.2 F (36.8 C)-98.8 F (37.1 C)] 98.7 F (37.1 C) (07/20 0607) Pulse Rate:  [77-99] 96 (07/20 0607) Cardiac Rhythm: Normal sinus rhythm (07/20 0756) Resp:  [20] 20 (07/20 0607) BP: (84-143)/(50-128) 108/68 (07/20 0607) SpO2:  [90 %-97 %] 94 % (07/20 0607) Weight:  [83.3 kg] 83.3 kg (07/20 0607)  Intake/Output from previous day: 07/19 0701 - 07/20 0700 In: 120 [P.O.:120] Out: 2945 [Urine:2875; Chest Tube:70]  General appearance: alert, cooperative, and no distress Heart: regular rate and rhythm Lungs: clear to auscultation bilaterally Abdomen: soft, non-tender; bowel sounds normal; no masses,  no organomegaly Extremities: extremities normal, atraumatic, no cyanosis or edema Wound: clean and dry  Lab Results: Recent Labs    01/31/24 0433 02/01/24 0407  WBC 9.4 7.1  HGB 11.9* 12.3*  HCT 35.8* 35.8*  PLT 152 163   BMET:  Recent Labs    01/31/24 0433 02/01/24 0407  NA 134* 138  K 4.0 3.5  CL 102 103  CO2 25 26  GLUCOSE 92 109*  BUN 13 14  CREATININE 1.07 0.86  CALCIUM  8.4* 8.4*    PT/INR:  Recent Labs    01/29/24 1217  LABPROT 16.9*  INR 1.3*   ABG    Component Value Date/Time   PHART 7.289 (L) 01/29/2024 1603   HCO3 19.0 (L) 01/29/2024 1603   TCO2 20 (L) 01/29/2024 1603   ACIDBASEDEF 7.0 (H) 01/29/2024 1603   O2SAT 91 01/29/2024 1603   CBG  (last 3)  Recent Labs    01/31/24 1624 01/31/24 2057 02/01/24 0605  GLUCAP 101* 123* 190*    Assessment/Plan: S/P Procedure(s) (LRB): OFF PUMP CORONARY ARTERY BYPASS GRAFTING X 1, USING HARVESTED LEFT RADIAL ARTERY  HARVESTED RIGHT SAPHENOUS VEIN (N/A) SURGICAL PROCUREMENT, ARTERY, RADIAL (Left) ECHOCARDIOGRAM, TRANSESOPHAGEAL, INTRAOPERATIVE (N/A)  CV- NSR with PVCs- Bp stable- will increase Lopressor  to 25 mg BID if BP allows, continue Norvasc  for RA artery Pulm- no acute issues, not requiring oxygen, continue IS Renal- creatinine and electrolytes are normal, weight is at baseline DM- on SSIP, will continue and resume home Jardiance  at 25 mg daily Dispo- patient doing very well, continue current care, titrate BB for better control of PVCs, resume Jardiance .. likely for d/c in AM   LOS: 3 days    Rocky Shad, PA-C 02/01/2024 9:08 AM   Agree Doing well Home tomorrow  Linnie MALVA Rayas

## 2024-02-02 ENCOUNTER — Other Ambulatory Visit (HOSPITAL_COMMUNITY): Payer: Self-pay

## 2024-02-02 ENCOUNTER — Inpatient Hospital Stay (HOSPITAL_COMMUNITY)

## 2024-02-02 DIAGNOSIS — Z951 Presence of aortocoronary bypass graft: Secondary | ICD-10-CM

## 2024-02-02 LAB — BASIC METABOLIC PANEL WITH GFR
Anion gap: 9 (ref 5–15)
BUN: 18 mg/dL (ref 6–20)
CO2: 24 mmol/L (ref 22–32)
Calcium: 8.5 mg/dL — ABNORMAL LOW (ref 8.9–10.3)
Chloride: 104 mmol/L (ref 98–111)
Creatinine, Ser: 0.95 mg/dL (ref 0.61–1.24)
GFR, Estimated: >60 mL/min (ref >60)
Glucose, Bld: 167 mg/dL — ABNORMAL HIGH (ref 70–99)
Potassium: 3.3 mmol/L — ABNORMAL LOW (ref 3.5–5.1)
Sodium: 137 mmol/L (ref 135–145)

## 2024-02-02 LAB — CBC
HCT: 34.8 % — ABNORMAL LOW (ref 39.0–52.0)
Hemoglobin: 12 g/dL — ABNORMAL LOW (ref 13.0–17.0)
MCH: 31.2 pg (ref 26.0–34.0)
MCHC: 34.5 g/dL (ref 30.0–36.0)
MCV: 90.4 fL (ref 80.0–100.0)
Platelets: 195 K/uL (ref 150–400)
RBC: 3.85 MIL/uL — ABNORMAL LOW (ref 4.22–5.81)
RDW: 12.5 % (ref 11.5–15.5)
WBC: 4.5 K/uL (ref 4.0–10.5)
nRBC: 0 % (ref 0.0–0.2)

## 2024-02-02 LAB — GLUCOSE, CAPILLARY: Glucose-Capillary: 140 mg/dL — ABNORMAL HIGH (ref 70–99)

## 2024-02-02 MED ORDER — ASPIRIN 325 MG PO TBEC: 325.0000 mg | DELAYED_RELEASE_TABLET | Freq: Every day | ORAL | Status: DC

## 2024-02-02 MED ORDER — AMLODIPINE BESYLATE 2.5 MG PO TABS
2.5000 mg | ORAL_TABLET | Freq: Every day | ORAL | 0 refills | Status: DC
  Filled 2024-02-02: qty 38, 38d supply, fill #0

## 2024-02-02 MED ORDER — ACETAMINOPHEN 325 MG PO TABS: 650.0000 mg | ORAL_TABLET | Freq: Four times a day (QID) | ORAL | Status: AC | PRN

## 2024-02-02 MED ORDER — METOPROLOL TARTRATE 25 MG PO TABS
25.0000 mg | ORAL_TABLET | Freq: Two times a day (BID) | ORAL | 1 refills | Status: DC
  Filled 2024-02-02: qty 60, 30d supply, fill #0

## 2024-02-02 MED ORDER — POTASSIUM CHLORIDE CRYS ER 20 MEQ PO TBCR
40.0000 meq | EXTENDED_RELEASE_TABLET | Freq: Once | ORAL | Status: AC
  Administered 2024-02-02: 40 meq via ORAL
  Filled 2024-02-02: qty 2

## 2024-02-02 MED ORDER — OXYCODONE HCL 5 MG PO TABS
5.0000 mg | ORAL_TABLET | Freq: Four times a day (QID) | ORAL | 0 refills | Status: DC | PRN
  Filled 2024-02-02: qty 28, 7d supply, fill #0

## 2024-02-02 NOTE — Progress Notes (Addendum)
      [address] [employer] [address]             [phone]      4 Days Post-Op Procedure(s) (LRB): OFF PUMP CORONARY ARTERY BYPASS GRAFTING X 1, USING HARVESTED LEFT RADIAL ARTERY  HARVESTED RIGHT SAPHENOUS VEIN (N/A) SURGICAL PROCUREMENT, ARTERY, RADIAL (Left) ECHOCARDIOGRAM, TRANSESOPHAGEAL, INTRAOPERATIVE (N/A) Subjective: Patient reports he walked 8-10 times yesterday. He does admit to some back pain that has been present from the mediastinal tubes but continues to improve.   Objective: Vital signs in last 24 hours: Temp:  [98.1 F (36.7 C)-98.9 F (37.2 C)] 98.9 F (37.2 C) (07/21 0302) Pulse Rate:  [83-96] 96 (07/21 0302) Cardiac Rhythm: Normal sinus rhythm (07/20 2130) Resp:  [16-20] 20 (07/21 0302) BP: (100-127)/(61-88) 110/71 (07/21 0302) SpO2:  [92 %-98 %] 93 % (07/21 0302) Weight:  [81.7 kg] 81.7 kg (07/21 0302)  Hemodynamic parameters for last 24 hours:    Intake/Output from previous day: No intake/output data recorded. Intake/Output this shift: No intake/output data recorded.  General appearance: alert, cooperative, and no distress Neurologic: intact Heart: regular rate and rhythm, PVCs, no murmur Lungs: clear to auscultation bilaterally Abdomen: soft, non-tender; bowel sounds normal; no masses,  no organomegaly Extremities: extremities normal, atraumatic, no cyanosis or edema Wound: Clean and dry without sign of infection  Lab Results: Recent Labs    02/01/24 0407 02/02/24 0433  WBC 7.1 4.5  HGB 12.3* 12.0*  HCT 35.8* 34.8*  PLT 163 195   BMET:  Recent Labs    02/01/24 0407 02/02/24 0433  NA 138 137  K 3.5 3.3*  CL 103 104  CO2 26 24  GLUCOSE 109* 167*  BUN 14 18  CREATININE 0.86 0.95  CALCIUM  8.4* 8.5*    PT/INR: No results for input(s): LABPROT, INR in the last 72 hours. ABG    Component Value Date/Time   PHART 7.289 (L) 01/29/2024 1603   HCO3 19.0 (L) 01/29/2024 1603   TCO2 20 (L) 01/29/2024 1603    ACIDBASEDEF 7.0 (H) 01/29/2024 1603   O2SAT 91 01/29/2024 1603   CBG (last 3)  Recent Labs    02/01/24 1712 02/01/24 2102 02/02/24 0625  GLUCAP 101* 115* 140*    Assessment/Plan: S/P Procedure(s) (LRB): OFF PUMP CORONARY ARTERY BYPASS GRAFTING X 1, USING HARVESTED LEFT RADIAL ARTERY  HARVESTED RIGHT SAPHENOUS VEIN (N/A) SURGICAL PROCUREMENT, ARTERY, RADIAL (Left) ECHOCARDIOGRAM, TRANSESOPHAGEAL, INTRAOPERATIVE (N/A)  CV: NSR, HR 80s-90s. SBP 110 this AM. On Norvasc  for radial artery conduit. On Lopressor  25mg  BID. K 3.3, supplement.   Pulm: Saturating well on RA. CXR with improved bilateral pleural effusions and left basilar atelectasis. Encourage IS and ambulation  GI: +BM yesterday. Tolerating a diet.  Endo: T2DM controlled, preop A1C 5.5. CBGs controlled on SSI. Restart home Jardiance  at discharge.   Renal: Cr 0.95, stable. No UO recorded. Under preop weight. K 3.3, supplement. Takes mag ox at home, will restart at discharge. As discussed with Dr. Lucas will not discharge home on Lasix .   ID: No leukocytosis, afebrile  Expected postop ABLA: H/H stable 12.3/35.8. Not clinically significant at this time.   DVT Prophylaxis: Lovenox   Back pain: Likely due to mediastinal tubes, patient reports this is improving. No Chest pain and no urinary complaints.   Dispo: As discussed with Dr. Lucas will discharge home today   LOS: 4 days    Con GORMAN Bend, PA-C 02/02/2024

## 2024-02-02 NOTE — Progress Notes (Signed)
 CARDIAC REHAB PHASE I   Pt reports he's been walking independently, no home needs. Pt and wife educated on restrictions, HH nutrition, exercise guidelines, sternal precautions, and CRP2 referral to [facility]. Pt is very eager for CRP2, explained referral process and timeline for scheduling after follow up appointments and clearance. All questions answered, pt voiced understanding.  [phone]   Con KATHEE Pereyra, MS, ACSM-CEP 02/02/2024 9:20 AM

## 2024-02-03 MED FILL — Sodium Chloride IV Soln 0.9%: INTRAVENOUS | Qty: 1000 | Status: AC

## 2024-02-09 MED FILL — Lidocaine HCl Local Preservative Free (PF) Inj 2%: INTRAMUSCULAR | Qty: 14 | Status: AC

## 2024-02-09 MED FILL — Heparin Sodium (Porcine) Inj 1000 Unit/ML: Qty: 1000 | Status: AC

## 2024-02-09 MED FILL — Potassium Chloride Inj 2 mEq/ML: INTRAVENOUS | Qty: 40 | Status: AC

## 2024-02-11 ENCOUNTER — Telehealth (HOSPITAL_COMMUNITY): Payer: Self-pay

## 2024-02-11 NOTE — Telephone Encounter (Signed)
 Pt insurance is active and benefits verified through [facility] Co-pay $25, DED $1,500/$1,500 met, out of pocket $5,000/$5,000 met, co-insurance 0%. no pre-authorization required. Passport 02/11/2024@10 :41, REF# [id number]  How many CR sessions are covered? (ICR)36 Is this a lifetime maximum or an annual maximum? annual Has the member used any of these services to date? no Is there a time limit (weeks/months) on start of program and/or program completion? no  Will contact patient to see if he is interested in the Cardiac Rehab Program. If interested, patient will need to complete follow up appt. Once completed, patient will be contacted for scheduling upon review by the RN Navigator.

## 2024-02-11 NOTE — Telephone Encounter (Signed)
 Called patient to see if he is interested in the Cardiac Rehab Program. Patient expressed interest. Explained scheduling process and went over insurance, patient verbalized understanding. Will contact patient for scheduling once f/u has been completed.

## 2024-02-12 ENCOUNTER — Ambulatory Visit
Attending: Thoracic Surgery (Cardiothoracic Vascular Surgery) | Admitting: Thoracic Surgery (Cardiothoracic Vascular Surgery)

## 2024-02-12 DIAGNOSIS — Z951 Presence of aortocoronary bypass graft: Secondary | ICD-10-CM

## 2024-02-12 NOTE — Progress Notes (Signed)
     [address]       [place] [address]             [phone]       Patient: Home Provider: Office Consent for Telemedicine visit obtained.  Today's visit was completed via a real-time telehealth (see specific modality noted below). The patient/authorized person provided oral consent at the time of the visit to engage in a telemedicine encounter with the present provider at [facility]. The patient/authorized person was informed of the potential benefits, limitations, and risks of telemedicine. The patient/authorized person expressed understanding that the laws that protect confidentiality also apply to telemedicine. The patient/authorized person acknowledged understanding that telemedicine does not provide emergency services and that he or she would need to call 911 or proceed to the nearest hospital for help if such a need arose.   Total time spent in the clinical discussion 10 minutes.  Telehealth Modality: Phone visit (audio only)  I had a telephone visit with  [name] who is s/p off pump cabg.  Overall doing well.  Pain is minimal.  Ambulating well. Vitals have been stable.  [name] Gentle will see us  back in 1 month with a chest x-ray for cardiac rehab clearance.  [name]

## 2024-02-13 ENCOUNTER — Telehealth: Admitting: Thoracic Surgery (Cardiothoracic Vascular Surgery)

## 2024-02-20 ENCOUNTER — Encounter: Payer: Self-pay | Admitting: Cardiology

## 2024-02-22 NOTE — Progress Notes (Unsigned)
 OFFICE NOTE:    Date:  02/23/2024  ID:  [name], DOB 26-Aug-1965, MRN [id number] PCP: [name], MD  [facility] Providers Cardiologist:  Peter Swaziland, MD Cardiology APP:  Lelon [name] DASEN, PA-C        Coronary artery disease s/p CABG CCTA 08/09/20: CAC score 534 (98th percentile); mod stenosis mid to dist LAD, mod stenosis in prox and dist RCA, mod stenosis OM1; aortic atherosclerosis; FFR normal  LHC 10/02/20: pLAD 20, mLAD 30, pLCx 30, OM1 30 ,RPDA 20 PET CT MPI 11/11/23: Large area of ischemia in apical to basal inf/inf-sept/inf-lat walls; global MBFR is reduced in each coronary distribution; +TID; EF 55>>50; high risk LHC 11/26/2023: LAD proximal-mid 20-30, LCx proximal 30, OM1 30; RCA mid 100 CTO with right to right and left-to-right collaterals PCI 01/01/2024: Unsuccessful CTO PCI of RCA TTE 01/28/24: EF 55-60, no RWMA, normal RVSF S/p CABG 01/2024 (L radial-PDA) PVCs Improved on beta-blocker (burden ? from 12% to 6%) TTE 07/22/22: EF 65-70, no RWMA, no valvular heart disease  Monitor 09/2023: No AFib, high grade HB, pauses; PVCs 6.1% FHx CAD Hyperlipidemia  Statin myalgias Diabetes mellitus  Tobacco use         Discussed the use of AI scribe software for clinical note transcription with the patient, who gave verbal consent to proceed. History of Present Illness Phillip Clark is a 58 y.o. male who returns for post hospital follow up.  He underwent cardiac catheterization in May 2025 due to significant anginal symptoms.  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  Medical therapy was adjusted.  He was seen by Dr. Swaziland for CTO PCI.  This was attempted in June and was unsuccessful.  The patient continued to have refractory symptoms despite maximal antianginal therapy.  He was therefore referred to CT surgery for CABG.  He was admitted 7/17-7/21 and underwent single-vessel CABG with left radial to PDA.  His  postoperative course was fairly uneventful.  Surgical wounds are healing well with some swelling in the right foot. He experiences difficulty sleeping due to soreness and changes in activity level, waking multiple times at night. He walks over 20 minutes daily. Heart rate increases after walking three-quarters of a lap around his driveway. No recurrence of pre-surgery chest symptoms. Occasional wobbliness when standing quickly, but no dizziness or syncope. Appetite has decreased, with a total weight loss of 50 pounds since January, 20 pounds post-surgery, and a regain of 10 pounds, currently weighing 180 pounds. He quit smoking prior to surgery.    ROS-See HPI    Studies Reviewed:  EKG Interpretation Date/Time:  Monday February 23 2024 08:18:21 EDT Ventricular Rate:  71 PR Interval:  154 QRS Duration:  92 QT Interval:  350 QTC Calculation: 380 R Axis:   93  Text Interpretation: Sinus rhythm with frequent Premature ventricular complexes Rightward axis Incomplete right bundle branch block TW inversions III, aVF, V6 Confirmed by Lelon [name] [id number]) on 02/23/2024 8:26:45 AM           Physical Exam:  VS:  BP 100/60   Pulse 71   Ht 6' (1.829 m)   Wt 182 lb 9.6 oz (82.8 kg)   SpO2 98%   BMI 24.77 kg/m        Wt Readings from Last 3 Encounters:  02/23/24 182 lb 9.6 oz (82.8 kg)  02/02/24 180 lb 3.2 oz (81.7 kg)  01/28/24 188 lb (85.3 kg)  Constitutional:      Appearance: Healthy appearance. Not in distress.  Neck:     Vascular: JVD normal.  Pulmonary:     Breath sounds: Normal breath sounds. No wheezing. No rales.  Cardiovascular:     Normal rate. Regular rhythm.     Murmurs: There is no murmur.     No rub.     Comments: Median sternotomy well-healed without erythema or discharge Edema:    Peripheral edema present.    Ankle: trace edema of the right ankle. Abdominal:     Palpations: Abdomen is soft.       Assessment and Plan:    Assessment & Plan Coronary artery  disease involving native coronary artery of native heart without angina pectoris History of chronic total occlusion of the RCA.  He had unsuccessful CTO PCI.  He continued to have refractory symptoms despite maximal antianginal therapy and subsequent underwent CABG x 1 with left radial to PDA graft.  He seems to be progressing well since his surgery.  He is walking a little more than 20 minutes a day.  He has not had any further anginal symptoms like he had prior to his surgery.  Chest soreness is improving.  He has follow-up later this month with CT surgery.  Reviewed his case with the PA at his discharge.  He should only be on amlodipine  for 6 weeks postop.  He no longer needs isosorbide . -Continue ASA 325 mg daily for 90 days postop, he can then change to 81 mg daily -Continue amlodipine  as directed by CT surgery -I have asked him to review his medications to make sure he is not on isosorbide  -Continue Lipitor 40 mg daily, metoprolol  tartrate 25 mg twice daily -Keep follow-up with Dr. Swaziland in October Premature ventricular contractions Previous PVC burden 12%. This improved to 6% on beta-blocker Rx.  He has frequent PVCs on his EKG today.  EF was normal by echocardiogram July 2025. - Will obtain BMET, magnesium .   - Continue metoprolol  tartrate 25 mg twice daily.   - Repeat EKG at follow-up in October.   - If he continues to have PVCs, consider repeating his monitor to assess burden.   Pure hypercholesterolemia LDL optimal.   - Continue Lipitor 40 mg daily, Zetia  10 mg daily.       Cardiac Rehabilitation Eligibility Assessment  The patient is ready to start cardiac rehabilitation pending clearance from the cardiac surgeon.     Dispo:  Return in 8 weeks (on 04/19/2024) for Scheduled Follow Up with Dr. Swaziland.  Signed, [name], PA-C

## 2024-02-22 NOTE — Assessment & Plan Note (Signed)
 Previous PVC burden 12%. This improved to 6% on beta-blocker Rx.

## 2024-02-22 NOTE — Assessment & Plan Note (Signed)
 History of chronic total occlusion of the RCA.  He had unsuccessful CTO PCI.  He continued to have refractory symptoms despite maximal antianginal therapy and subsequent underwent CABG x 1 with left radial to PDA graft.

## 2024-02-23 ENCOUNTER — Ambulatory Visit: Attending: Physician Assistant | Admitting: Physician Assistant

## 2024-02-23 ENCOUNTER — Encounter: Payer: Self-pay | Admitting: Physician Assistant

## 2024-02-23 VITALS — BP 100/60 | HR 71 | Ht 72.0 in | Wt 182.6 lb

## 2024-02-23 DIAGNOSIS — E7800 Pure hypercholesterolemia, unspecified: Secondary | ICD-10-CM | POA: Diagnosis not present

## 2024-02-23 DIAGNOSIS — I2510 Atherosclerotic heart disease of native coronary artery without angina pectoris: Secondary | ICD-10-CM | POA: Diagnosis not present

## 2024-02-23 DIAGNOSIS — I493 Ventricular premature depolarization: Secondary | ICD-10-CM

## 2024-02-23 NOTE — Assessment & Plan Note (Signed)
 LDL optimal.   - Continue Lipitor 40 mg daily, Zetia  10 mg daily.

## 2024-02-23 NOTE — Patient Instructions (Signed)
 Medication Instructions:  STOP Isosorbide   *If you need a refill on your cardiac medications before your next appointment, please call your pharmacy*  Lab Work: Today BMET, Magnesium  If you have labs (blood work) drawn today and your tests are completely normal, you will receive your results only by: MyChart Message (if you have MyChart) OR A paper copy in the mail If you have any lab test that is abnormal or we need to change your treatment, we will call you to review the results.  Follow-Up: Keep upcoming appointment   Provider:   Dr [place]

## 2024-02-24 ENCOUNTER — Ambulatory Visit: Payer: Self-pay | Admitting: Physician Assistant

## 2024-02-24 LAB — BASIC METABOLIC PANEL WITH GFR
BUN/Creatinine Ratio: 25 — ABNORMAL HIGH (ref 9–20)
BUN: 29 mg/dL — ABNORMAL HIGH (ref 6–24)
CO2: 21 mmol/L (ref 20–29)
Calcium: 9 mg/dL (ref 8.7–10.2)
Chloride: 100 mmol/L (ref 96–106)
Creatinine, Ser: 1.17 mg/dL (ref 0.76–1.27)
Glucose: 59 mg/dL — ABNORMAL LOW (ref 70–99)
Potassium: 4.7 mmol/L (ref 3.5–5.2)
Sodium: 140 mmol/L (ref 134–144)
eGFR: 73 mL/min/1.73 (ref >59)

## 2024-02-24 LAB — MAGNESIUM: Magnesium: 2.6 mg/dL — ABNORMAL HIGH (ref 1.6–2.3)

## 2024-02-27 ENCOUNTER — Telehealth: Payer: Self-pay | Admitting: Cardiology

## 2024-02-27 NOTE — Telephone Encounter (Signed)
 Received another [employer] disability form.  This form is in addition to the one completed in May.  We have the previous release of information that is still effective. I did not receive an additional $29 form fee as the patient just paid it less that 3 months ago for the same form.  Form is in Dr. Gib box.

## 2024-03-02 ENCOUNTER — Other Ambulatory Visit: Payer: Self-pay | Admitting: Cardiology

## 2024-03-03 ENCOUNTER — Other Ambulatory Visit: Payer: Self-pay | Admitting: *Deleted

## 2024-03-03 MED ORDER — METOPROLOL TARTRATE 25 MG PO TABS: 25.0000 mg | ORAL_TABLET | Freq: Two times a day (BID) | ORAL | 1 refills | Status: DC

## 2024-03-09 ENCOUNTER — Other Ambulatory Visit: Payer: Self-pay | Admitting: Thoracic Surgery (Cardiothoracic Vascular Surgery)

## 2024-03-09 DIAGNOSIS — Z951 Presence of aortocoronary bypass graft: Secondary | ICD-10-CM

## 2024-03-09 NOTE — Telephone Encounter (Signed)
 Disability form faxed to [facility] and scanned into chart.

## 2024-03-10 ENCOUNTER — Ambulatory Visit

## 2024-03-10 ENCOUNTER — Ambulatory Visit (HOSPITAL_COMMUNITY)
Admission: RE | Admit: 2024-03-10 | Discharge: 2024-03-10 | Disposition: A | Discharge status: Home / Self Care | Source: Ambulatory Visit | Attending: Cardiology | Admitting: Cardiology

## 2024-03-10 ENCOUNTER — Other Ambulatory Visit (HOSPITAL_COMMUNITY): Payer: Self-pay

## 2024-03-10 VITALS — BP 119/77 | HR 90 | Resp 18 | Ht 72.0 in | Wt 190.0 lb

## 2024-03-10 DIAGNOSIS — Z951 Presence of aortocoronary bypass graft: Secondary | ICD-10-CM | POA: Diagnosis present

## 2024-03-10 DIAGNOSIS — I2510 Atherosclerotic heart disease of native coronary artery without angina pectoris: Secondary | ICD-10-CM

## 2024-03-10 DIAGNOSIS — J90 Pleural effusion, not elsewhere classified: Secondary | ICD-10-CM

## 2024-03-10 MED ORDER — POTASSIUM CHLORIDE CRYS ER 20 MEQ PO TBCR: 20.0000 meq | EXTENDED_RELEASE_TABLET | Freq: Every day | ORAL | 0 refills | Status: DC

## 2024-03-10 MED ORDER — FUROSEMIDE 40 MG PO TABS: 40.0000 mg | ORAL_TABLET | Freq: Every day | ORAL | 0 refills | Status: DC

## 2024-03-10 NOTE — Patient Instructions (Signed)
-  Take medications as prescribed -Follow up with clinic as needed

## 2024-03-10 NOTE — Progress Notes (Signed)
 [address] [place] [address]             [phone]       HPI: Patient returns for routine postoperative follow-up having undergone Off pump CABG X 1, left radial artery to PDA, open left radial artery harvest, and open right greater saphenous vein harvest on 01/29/2024 with Dr. Shyrl.  The patient's early postoperative recovery while in the hospital was notable for being started on amlodipine  for radial artery harvest. He maintained normal sinus rhythm.  He was discharged in stable condition on 02/02/2024.  Since hospital discharge the patient reports that he has been doing well.  He has not needed to take anything for pain. He has been walking 3 to 4 times a day.  He does notice the feeling of not being able to take a full breath for the past 2 days.  He denies chest pain and lower leg swelling.     Current Outpatient Medications  Medication Sig Dispense Refill   acetaminophen  (TYLENOL ) 325 MG tablet Take 2 tablets (650 mg total) by mouth every 6 (six) hours as needed. (Patient taking differently: Take 650 mg by mouth every 6 (six) hours as needed for mild pain (pain score 1-3) or moderate pain (pain score 4-6).)     amLODipine  (NORVASC ) 2.5 MG tablet Take 1 tablet (2.5 mg total) by mouth daily. Complete prescription but you will not require refill 38 tablet 0   aspirin  EC 325 MG tablet Take 1 tablet (325 mg total) by mouth daily.     atorvastatin  (LIPITOR) 40 MG tablet TAKE 1 TABLET BY MOUTH AT BEDTIME 90 tablet 3   ezetimibe  (ZETIA ) 10 MG tablet Take 1 tablet by mouth once daily 90 tablet 3   JARDIANCE  25 MG TABS tablet Take 25 mg by mouth daily.     magnesium  oxide (MAG-OX) 400 MG tablet Take 400 mg by mouth daily.     metoprolol  tartrate (LOPRESSOR ) 25 MG tablet Take 1 tablet (25 mg total) by mouth 2 (two) times daily. 60 tablet 1   Multiple Vitamin (MULTIVITAMIN ADULT PO) Take 1 tablet by mouth daily.     Omega-3 Fatty Acids (FISH OIL) 1000 MG  CAPS Take 1,000 mg by mouth daily.     oxyCODONE  (OXY IR/ROXICODONE ) 5 MG immediate release tablet Take 1 tablet (5 mg total) by mouth every 6 (six) hours as needed for severe pain (pain score 7-10). 28 tablet 0   No current facility-administered medications for this visit.   Vitals:   03/10/24 1051  BP: 119/77  Pulse: 90  Resp: 18  SpO2: 97%   Review of Systems  Constitutional:  Negative for malaise/fatigue.  Respiratory: Negative.  Negative for cough, shortness of breath and wheezing.   Cardiovascular:  Negative for chest pain and leg swelling.    Physical Exam Constitutional:      Appearance: Normal appearance.  HENT:     Head: Normocephalic and atraumatic.  Cardiovascular:     Rate and Rhythm: Normal rate and regular rhythm.     Heart sounds: Normal heart sounds, S1 normal and S2 normal.  Pulmonary:     Effort: Pulmonary effort is normal.     Breath sounds: Normal breath sounds.  Skin:    General: Skin is warm and dry.  Neurological:     General: No focal deficit present.     Mental Status: He is alert and oriented to person, place,  and time.      Diagnostic Tests: CLINICAL DATA:  Follow from CABG 01/29/2024. Previous left pleural effusion.   EXAM: CHEST - 2 VIEW   COMPARISON:  02/02/2024   FINDINGS: Prior median sternotomy. Cardiac and mediastinal margins appear normal.   The lateral costophrenic angles are no longer blunted, and there is only trace blunting of the posterior costophrenic angles bilaterally.   The lungs appear otherwise clear.  No significant bony findings.   IMPRESSION: 1. Trace bilateral pleural effusions, reduced from prior. 2. Prior median sternotomy and CABG. 3. No acute findings.     Electronically Signed   By: Ryan Salvage M.D.   On: 03/10/2024 11:20   Plan: S/P CABG x 1 We reviewed today's chest x ray. He still has small bilateral pleural effusions.  We discussed driving and he is able to do so at this time since he  is not taking narcotics for pain.  Per send I think should be a short distance in the daytime and he can slowly increase after that.  He would like to participate in cardiac rehab and is cleared to do so from a surgical standpoint at this time.  He is to continue sternal precautions until a full 6 weeks from surgery.  He has been seen by cardiology and he is to continue aspirin , Lipitor and metoprolol .  Due to radial artery harvest recommend he continue amlodipine  for 1 year.    Pleural effusion -He is to start furosemide  and potassium daily for 3 days to see if this helps clear trace pleural effusions. - He is to reach out if he starts to experience shortness of breath, coughing and lower leg swelling    Manuelita CHRISTELLA Rough, PA-C [facility] [phone]

## 2024-03-17 ENCOUNTER — Telehealth: Payer: Self-pay | Admitting: Cardiology

## 2024-03-17 DIAGNOSIS — E7800 Pure hypercholesterolemia, unspecified: Secondary | ICD-10-CM

## 2024-03-17 DIAGNOSIS — I2510 Atherosclerotic heart disease of native coronary artery without angina pectoris: Secondary | ICD-10-CM

## 2024-03-17 MED ORDER — METOPROLOL TARTRATE 25 MG PO TABS: 25.0000 mg | ORAL_TABLET | Freq: Two times a day (BID) | ORAL | 3 refills | Status: AC

## 2024-03-17 MED ORDER — ATORVASTATIN CALCIUM 40 MG PO TABS: 40.0000 mg | ORAL_TABLET | Freq: Every day | ORAL | 3 refills | Status: AC

## 2024-03-17 MED ORDER — EZETIMIBE 10 MG PO TABS: 10.0000 mg | ORAL_TABLET | Freq: Every day | ORAL | 3 refills | Status: AC

## 2024-03-17 MED ORDER — AMLODIPINE BESYLATE 2.5 MG PO TABS: 2.5000 mg | ORAL_TABLET | Freq: Every day | ORAL | 3 refills | Status: DC

## 2024-03-17 NOTE — Telephone Encounter (Signed)
 Last office visit per [name], PA,Continue amlodipine  as directed by CT surgery. Pt's medications were sent to pt's pharmacy as requested. Confirmation received.

## 2024-03-17 NOTE — Telephone Encounter (Signed)
*  STAT* If patient is at the pharmacy, call can be transferred to refill team.   1. Which medications need to be refilled? (please list name of each medication and dose if known)   atorvastatin  (LIPITOR) 40 MG tablet  ezetimibe  (ZETIA ) 10 MG tablet   metoprolol  tartrate (LOPRESSOR ) 25 MG tablet    amLODipine  (NORVASC ) 2.5 MG tablet    2. Would you like to learn more about the convenience, safety, & potential cost savings by using the [facility]? no     3. Are you open to using the [facility] (Type [facility]. NO ).   4. Which pharmacy/location (including street and city if local pharmacy) is medication to be sent to? [facility] [address], [place] - [address] Phone: [phone]  Fax: [phone]       5. Do they need a 30 day or 90 day supply? 90 Pt is losing his insurance Saturday and wants to have 3 MONTHS supply before that happens

## 2024-03-18 ENCOUNTER — Other Ambulatory Visit (HOSPITAL_COMMUNITY): Payer: Self-pay

## 2024-03-18 ENCOUNTER — Telehealth (HOSPITAL_COMMUNITY): Payer: Self-pay

## 2024-03-18 NOTE — Telephone Encounter (Signed)
 Called patient to schedule cardiac rehab, patient states he is on [employer] benefits and his coverage may end tomorrow (he is called them today to find out). Confirmed we have no active benefits listed in patient's chart, informed patient he will have to call us  back and give us  his current insurance information for us  to proceed as he has no insurance on file at the moment.

## 2024-04-01 ENCOUNTER — Telehealth (HOSPITAL_COMMUNITY): Payer: Self-pay

## 2024-04-01 NOTE — Telephone Encounter (Signed)
 Attempted f/u call regarding cardiac rehab- no answer, left message. Sent MyChart message.  Closing referral.

## 2024-04-10 NOTE — Progress Notes (Signed)
 Cardiology Office Note:    Date:  04/19/2024   ID:  Phillip Clark, DOB Feb 05, 1966, MRN [id number]  PCP:  [name], MD   [facility] Providers Cardiologist:  [name], MD Cardiology APP:  [name]     Referring MD: [name], MD   Chief Complaint  Patient presents with   Coronary artery disease of native artery of native heart wi   Follow-up    History of Present Illness:    Phillip Clark is a 58 y.o. male is seen at the request of Dr Elmira for evaluation for potential CTO PCI. He has a history of HTN an HLD. Also history of tobacco abuse. Underwent cardiac cath in 2022 showing nonobstructive CAD. More recently developed typical exertional angina. Medical therapy titrated but symptoms persisted. PET myocardial perfusion imaging revealed a large ischemic area in the apical to basal inferior, infraseptal, and lateral walls, with reduced global myocardial blood flow reserve and transient ischemic dilatation, indicating high risk. He underwent repeat cardiac cath showing occlusion of the mid RCA with left to right collaterals to the PDA and PL branches.  The patient reports to me that he has been having symptoms of chest pain for the past 13 months. He has chest pain worse with exertion. Also complains of palpitations, dizziness, lightheadedness. Symptoms have persisted despite antianginal therapy. He is a smoker and plans to quit. 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 He reports 3 ED visits in the last year due to chest pain.  He underwent attempted CTO PCI of the RCA on 01/01/24. This was unsuccessful. Unable to cross lesion with either antegrade and retrograde approaches. We did place him on Ranexa . This has helped but he still has limiting angina with activity and under stress.   He was seen by Dr Shyrl and underwent CABG x 1 with left radial to PDA graft on 01/29/24.    Since surgery he had 2 occasions where he heard and felt a pop in his anterior chest with severe pain. Better with compression. No instability of sternum. Reports anginal symptoms are still there but less intense. Hasn't been taking amlodipine .   Past Medical History:  Diagnosis Date   Coronary artery calcification of native artery    Diabetes mellitus without complication (HCC)    Essential tremor    Hyperlipidemia    Nonobstructive atherosclerosis of coronary artery    Pneumonia    as a child    Past Surgical History:  Procedure Laterality Date   CORONARY ARTERY BYPASS GRAFT N/A 01/29/2024   Procedure: OFF PUMP CORONARY ARTERY BYPASS GRAFTING X 1, USING HARVESTED LEFT RADIAL ARTERY  HARVESTED RIGHT SAPHENOUS VEIN;  Surgeon: [name], MD;  Location: [facility] OR;  Service: Open Heart Surgery;  Laterality: N/A;   CORONARY CTO INTERVENTION N/A 01/01/2024   Procedure: CORONARY CTO INTERVENTION;  Surgeon: [place], Adrielle Polakowski M, MD;  [facility] INVASIVE CV LAB;  Service: Cardiovascular;  Laterality: N/A;   FRACTURE SURGERY Left    fracture foot with pin placed   HIP ARTHROPLASTY Right 2021   INTRAOPERATIVE TRANSESOPHAGEAL ECHOCARDIOGRAM N/A 01/29/2024   Procedure: ECHOCARDIOGRAM, TRANSESOPHAGEAL, INTRAOPERATIVE;  Surgeon: [name], MD;  Location: [facility] OR;  Service: Open Heart Surgery;  Laterality: N/A;   LEFT HEART CATH AND CORONARY ANGIOGRAPHY N/A 10/02/2020   Procedure: LEFT HEART CATH AND CORONARY ANGIOGRAPHY;  Surgeon: [name],  MD;  Location: [facility] INVASIVE CV LAB;  Service: Cardiovascular;  Laterality: N/A;   LEFT HEART CATH AND CORONARY ANGIOGRAPHY N/A 11/26/2023   Procedure: LEFT HEART CATH AND CORONARY ANGIOGRAPHY;  Surgeon: [name], MD;  Location: [facility] INVASIVE CV LAB;  Service: Cardiovascular;  Laterality: N/A;   RADIAL ARTERY HARVEST Left 01/29/2024   Procedure: SURGICAL PROCUREMENT, ARTERY, RADIAL;  Surgeon: [name], MD;  Location: [facility]  OR;  Service: Open Heart Surgery;  Laterality: Left;   UMBILICAL HERNIA REPAIR     VASECTOMY      Current Medications: Current Meds  Medication Sig   acetaminophen  (TYLENOL ) 325 MG tablet Take 2 tablets (650 mg total) by mouth every 6 (six) hours as needed.   aspirin  EC 325 MG tablet Take 1 tablet (325 mg total) by mouth daily.   atorvastatin  (LIPITOR) 40 MG tablet Take 1 tablet (40 mg total) by mouth at bedtime.   ezetimibe  (ZETIA ) 10 MG tablet Take 1 tablet (10 mg total) by mouth daily.   JARDIANCE  25 MG TABS tablet Take 25 mg by mouth daily.   magnesium  oxide (MAG-OX) 400 MG tablet Take 400 mg by mouth daily.   metoprolol  tartrate (LOPRESSOR ) 25 MG tablet Take 1 tablet (25 mg total) by mouth 2 (two) times daily.   Multiple Vitamin (MULTIVITAMIN ADULT PO) Take 1 tablet by mouth daily.   Omega-3 Fatty Acids (FISH OIL) 1000 MG CAPS Take 1,000 mg by mouth daily.   [DISCONTINUED] amLODipine  (NORVASC ) 2.5 MG tablet Take 1 tablet (2.5 mg total) by mouth daily.   [DISCONTINUED] potassium chloride  SA (KLOR-CON  M) 20 MEQ tablet Take 1 tablet (20 mEq total) by mouth daily.     Allergies:   Latex, Crestor [rosuvastatin calcium ], Rosuvastatin, Other, Topiramate, Varenicline, and Tape   Social History   Socioeconomic History   Marital status: Married    Spouse name: Not on file   Number of children: 0   Years of education: Not on file   Highest education level: Not on file  Occupational History   Not on file  Tobacco Use   Smoking status: Every Day    Current packs/day: 0.50    Average packs/day: 0.5 packs/day for 40.8 years (20.4 ttl pk-yrs)    Types: Cigarettes    Start date: 33    Last attempt to quit: 2018   Smokeless tobacco: Never  Vaping Use   Vaping status: Never Used  Substance and Sexual Activity   Alcohol use: Not Currently   Drug use: Never   Sexual activity: Yes  Other Topics Concern   Not on file  Social History Narrative   Not on file   Social Drivers of  Health   Financial Resource Strain: Not on file  Food Insecurity: No Food Insecurity (01/31/2024)   Hunger Vital Sign    Worried About Running Out of Food in the Last Year: Never true    Ran Out of Food in the Last Year: Never true  Transportation Needs: No Transportation Needs (01/31/2024)   PRAPARE - Administrator, Civil Service (Medical): No    Lack of Transportation (Non-Medical): No  Physical Activity: Not on file  Stress: Not on file  Social Connections: Unknown (11/23/2021)   Received from [facility]   Social Network    Social Network: Not on file     Family History: The patient's family history includes Diabetes in his mother; Heart attack in his father; Hyperlipidemia in his brother, father, mother, sister,  sister, sister, and sister; Hypertension in his brother, father, mother, and sister; Thyroid disease in his mother.  ROS:   Please see the history of present illness.     All other systems reviewed and are negative.  EKGs/Labs/Other Studies Reviewed:    The following studies were reviewed today: Echocardiogram 08/01/2022:  Normal LV systolic function with visual EF 65-70%. Left ventricle cavity  is normal in size. Normal left ventricular wall thickness. Normal global  wall motion. Normal diastolic filling pattern, normal LAP.  No significant valvular heart disease.  Compared to 09/30/2020 otherwise no significant chang   Event monitor 10/02/23: Study Highlights  Cardiac monitor (Zio Patch): September 21, 2023-September 25, 2023 Dominant rhythm sinus. Heart rate 48-116 bpm.  Avg HR 78 bpm. No atrial fibrillation detected during the monitoring period. No supraventricular tachycardia, ventricular tachycardia, high grade AV block, pauses (3 seconds or longer). Total supraventricular ectopic burden <1%. Total ventricular ectopic burden 6.1% (predominantly isolated beats). Patient triggered events: 9.  These episodes illustrate underlying rhythm to be sinus with  ventricular ectopy (majority of the time).  PET CT 11/11/23: Study Result  Narrative & Impression      Large reversible perfusion defect in apical to basal inferior/inferoseptal/inferolateral walls, consistent with ischemia.  Myocardial blood flow reserve is reduced globally and in each coronary distribution.  In addition, high risk findings are present, including TID (1.16) and drop in EF with stress, concerning for severe multivessel disease.  Overall, study is high risk and cardiac catheterization is recommended   LV perfusion is abnormal. There is evidence of ischemia. Defect 1: There is a large defect with moderate reduction in uptake present in the apical to basal inferior, inferolateral and inferoseptal location(s) that is reversible. There is normal wall motion in the defect area. Consistent with ischemia.   Rest left ventricular function is normal. Rest EF: 55%. Stress left ventricular function is normal. Stress EF: 50%. End diastolic cavity size is normal. End systolic cavity size is normal.   Myocardial blood flow was computed to be 0.51ml/g/min at rest and 1.32ml/g/min at stress. Global myocardial blood flow reserve was 1.47 and was abnormal.   Coronary calcium  was present on the attenuation correction CT images. Moderate coronary calcifications were present. Coronary calcifications were present in the left anterior descending artery, left circumflex artery and right coronary artery distribution(s).   Findings are consistent with ischemia. The study is high risk.   Electronically signed by Lonni Nanas, MD   Cardiac cath 11/26/23:  LEFT HEART CATH AND CORONARY ANGIOGRAPHY   Conclusion  Coronary angiography 09/26/2023: LM: No significant disease LAD: Prox-mid 20-30% disease Lcx: Prox Lcx, OM1 30% disease RCA: Mid CTO after RV marginal branch           Minimal right-to-right, robust left-to--right collaterals from LAD, both septal and epicardial   LVEDP 22 mmHg       Recommend uptitration of ant anginal therapy and refer to Dr. Swaziland for CTO revascularization.   Newman JINNY Lawrence, MD EKG Interpretation Date/Time:  Monday April 19 2024 13:28:18 EDT Ventricular Rate:  79 PR Interval:  164 QRS Duration:  96 QT Interval:  360 QTC Calculation: 412 R Axis:   63  Text Interpretation: Sinus rhythm with occasional Premature ventricular complexes Possible Left atrial enlargement Incomplete right bundle branch block Anterior infarct , age undetermined When compared with ECG of 23-Feb-2024 08:18, No significant change was found Confirmed by [place], Jennylee Uehara [phone]) on 04/19/2024 1:30:52 PM    PCI 01/01/24: Unsuccessful CTO  of the RCA using antegrade and retrograde approaches. Unable to cross with wire.   Plan: will add Ranexa  to medical therapy. Options include referral to another CTO operator or consideration of CABG. Will discuss with patient  Recent Labs: 01/28/2024: ALT 27 02/02/2024: Hemoglobin 12.0; Platelets 195 02/23/2024: BUN 29; Creatinine, Ser 1.17; Magnesium  2.6; Potassium 4.7; Sodium 140  Recent Lipid Panel    Component Value Date/Time   CHOL 62 01/30/2024 0433   CHOL 131 09/05/2022 1244   TRIG 45 01/30/2024 0433   HDL 29 (L) 01/30/2024 0433   HDL 37 (L) 09/05/2022 1244   CHOLHDL 2.1 01/30/2024 0433   VLDL 9 01/30/2024 0433   LDLCALC 24 01/30/2024 0433   LDLCALC 74 09/05/2022 1244   LDLDIRECT 72 09/05/2022 1244   EKG Interpretation Date/Time:  Monday April 19 2024 13:28:18 EDT Ventricular Rate:  79 PR Interval:  164 QRS Duration:  96 QT Interval:  360 QTC Calculation: 412 R Axis:   63  Text Interpretation: Sinus rhythm with occasional Premature ventricular complexes Possible Left atrial enlargement Incomplete right bundle branch block Anterior infarct , age undetermined When compared with ECG of 23-Feb-2024 08:18, No significant change was found Confirmed by [place], Bryanne Riquelme [phone]) on 04/19/2024 1:30:52 PM    Risk  Assessment/Calculations:     Physical Exam:    VS:  BP 99/62 (BP Location: Left Arm, Patient Position: Sitting, Cuff Size: Normal)   Pulse (!) 52   Resp 16   Ht 6' (1.829 m)   Wt 192 lb 9.6 oz (87.4 kg)   SpO2 95%   BMI 26.12 kg/m     Wt Readings from Last 3 Encounters:  04/19/24 192 lb 9.6 oz (87.4 kg)  03/10/24 190 lb (86.2 kg)  02/23/24 182 lb 9.6 oz (82.8 kg)     GEN:  Well nourished, well developed in no acute distress HEENT: Normal NECK: No JVD; No carotid bruits LYMPHATICS: No lymphadenopathy CARDIAC: RRR, no murmurs, rubs, gallops Chest reveals healed median sternotomy scar. Sternum is very stable to palpation.  RESPIRATORY:  Clear to auscultation without rales, wheezing or rhonchi  ABDOMEN: Soft, non-tender, non-distended MUSCULOSKELETAL:  No edema; small knot in left groin. Moderate bilateral groin bruising. No bruit.  SKIN: Warm and dry NEUROLOGIC:  Alert and oriented x 3 PSYCHIATRIC:  Normal affect   ASSESSMENT:    1. Coronary atherosclerosis due to calcified coronary lesion   2. Coronary artery disease of bypass graft of native heart with stable angina pectoris   3. Pure hypercholesterolemia      PLAN:    In order of problems listed above:  CAD with CTO of the mid RCA with left to right collaterals. RCA is a large dominant vessel. It was patent in 2022. He had symptoms refractory to medical therapy. He has no significant stenosis in the left coronary system. Unfortunately unable to revascularize with CTO PCI. Now s/p single vessel CABG with radial artery graft to the PDA. Clinically doing ok. Will leave off amlodipine  due to low BP. Continue ASA 325 mg daily for one month then 81 mg daily. Encourage enrollment in Cardiac Rehab. Will delay return to work for 2 months.       Medication Adjustments/Labs and Tests Ordered: Current medicines are reviewed at length with the patient today.  Concerns regarding medicines are outlined above.  Orders Placed This  Encounter  Procedures   EKG 12-Lead   No orders of the defined types were placed in this encounter.   Patient Instructions  Medication Instructions:  Continue same medications *If you need a refill on your cardiac medications before your next appointment, please call your pharmacy*  Lab Work: None ordered  Testing/Procedures: None ordered  Follow-Up: At [facility], you and your health needs are our priority.  As part of our continuing mission to provide you with exceptional heart care, our providers are all part of one team.  This team includes your primary Cardiologist (physician) and Advanced Practice Providers or APPs (Physician Assistants and Nurse Practitioners) who all work together to provide you with the care you need, when you need it.  Your next appointment:  3 months    Provider:  [name] PA    Cardiac Rehab will call    We recommend signing up for the patient portal called MyChart.  Sign up information is provided on this After Visit Summary.  MyChart is used to connect with patients for Virtual Visits (Telemedicine).  Patients are able to view lab/test results, encounter notes, upcoming appointments, etc.  Non-urgent messages can be sent to your provider as well.   To learn more about what you can do with MyChart, go to [email or web address].            Signed, Dezirea Mccollister [place], MD  04/19/2024 1:56 PM    [facility] v

## 2024-04-15 ENCOUNTER — Telehealth (HOSPITAL_COMMUNITY): Payer: Self-pay

## 2024-04-15 NOTE — Telephone Encounter (Signed)
 Pt's wife LVM stating pt is ready to schedule for Cardiac Rehab. Will pass to support staff to verify benefits and schedule appt

## 2024-04-19 ENCOUNTER — Ambulatory Visit: Payer: Self-pay | Attending: Cardiology | Admitting: Cardiology

## 2024-04-19 ENCOUNTER — Encounter: Payer: Self-pay | Admitting: Cardiology

## 2024-04-19 VITALS — BP 99/62 | HR 52 | Resp 16 | Ht 72.0 in | Wt 192.6 lb

## 2024-04-19 DIAGNOSIS — I2584 Coronary atherosclerosis due to calcified coronary lesion: Secondary | ICD-10-CM | POA: Diagnosis not present

## 2024-04-19 DIAGNOSIS — E7800 Pure hypercholesterolemia, unspecified: Secondary | ICD-10-CM | POA: Diagnosis not present

## 2024-04-19 DIAGNOSIS — I2510 Atherosclerotic heart disease of native coronary artery without angina pectoris: Secondary | ICD-10-CM | POA: Diagnosis not present

## 2024-04-19 DIAGNOSIS — I25708 Atherosclerosis of coronary artery bypass graft(s), unspecified, with other forms of angina pectoris: Secondary | ICD-10-CM | POA: Diagnosis not present

## 2024-04-19 NOTE — Patient Instructions (Signed)
 Medication Instructions:  Continue same medications *If you need a refill on your cardiac medications before your next appointment, please call your pharmacy*  Lab Work: None ordered  Testing/Procedures: None ordered  Follow-Up: At [facility], you and your health needs are our priority.  As part of our continuing mission to provide you with exceptional heart care, our providers are all part of one team.  This team includes your primary Cardiologist (physician) and Advanced Practice Providers or APPs (Physician Assistants and Nurse Practitioners) who all work together to provide you with the care you need, when you need it.  Your next appointment:  3 months    Provider:  [name] PA    Cardiac Rehab will call    We recommend signing up for the patient portal called MyChart.  Sign up information is provided on this After Visit Summary.  MyChart is used to connect with patients for Virtual Visits (Telemedicine).  Patients are able to view lab/test results, encounter notes, upcoming appointments, etc.  Non-urgent messages can be sent to your provider as well.   To learn more about what you can do with MyChart, go to [email or web address].

## 2024-04-20 ENCOUNTER — Telehealth (HOSPITAL_COMMUNITY): Payer: Self-pay

## 2024-04-20 NOTE — Telephone Encounter (Signed)
 Pt insurance is active and benefits verified through [facility]?) Co-pay $25, DED $0/$0 met, out of pocket $5,000/$5,000 met, co-insurance 0%. No pre-authorization required. 04/20/2024 @ 2:30pm, spoke with Alm, REF# [id number].  TCR/ICR? ICR Visit(date of service)limitation? 36 day limit Can multiple codes be used on the same date of service/visit?(IF ITS A LIMIT) Yes  Is this a lifetime maximum or an annual maximum? Annual Has the member used any of these services to date? No Is there a time limit (weeks/months) on start of program and/or program completion? No  *Patient states this is [employer], [facility] can only confirm his coverage is set to end 10/31 (when he pays premium for November it will extend to 10/30) and has no way to see if this is [employer]. This insurance is an extension of the benefits he had through his employer.

## 2024-04-20 NOTE — Telephone Encounter (Signed)
 Called patient to see if he was interested in participating in the Cardiac Rehab Program. Patient will come in for orientation on 10/08 and will attend the 8:15 exercise class.  Sent MyChart message.

## 2024-04-21 ENCOUNTER — Encounter (HOSPITAL_COMMUNITY)
Admission: RE | Admit: 2024-04-21 | Discharge: 2024-04-21 | Disposition: A | Discharge status: Home / Self Care | Source: Ambulatory Visit | Attending: Cardiology | Admitting: Cardiology

## 2024-04-21 VITALS — BP 102/68 | HR 73 | Ht 72.0 in | Wt 193.1 lb

## 2024-04-21 DIAGNOSIS — Z951 Presence of aortocoronary bypass graft: Secondary | ICD-10-CM | POA: Insufficient documentation

## 2024-04-21 NOTE — Progress Notes (Signed)
 Cardiac Rehab Medication Review   Does the patient  feel that his/her medications are working for him/her? Yes    Has the patient been experiencing any side effects to the medications prescribed? No  Does the patient measure his/her own blood pressure or blood glucose at home?   Yes  Does the patient have any problems obtaining medications due to transportation or finances?   No  Understanding of regimen: good Understanding of indications: good Potential of compliance: good    Comments: Aubry understands his medications and regime well. He checks his CBG's 3x/day and BP occasionally.     Con KATHEE Pereyra, MS, ACSM-CEP 04/21/2024 11:33 AM

## 2024-04-21 NOTE — Progress Notes (Signed)
 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  Patient's Home Medications on Admission:  Current Outpatient Medications:    acetaminophen  (TYLENOL ) 325 MG tablet, Take 2 tablets (650 mg total) by mouth every 6 (six) hours as needed., Disp: , Rfl:    aspirin  EC 325 MG tablet, Take 1 tablet (325 mg total) by mouth daily., Disp: , Rfl:    atorvastatin  (LIPITOR) 40 MG tablet, Take 1 tablet (40 mg total) by mouth at bedtime., Disp: 90 tablet, Rfl: 3   ezetimibe  (ZETIA ) 10 MG tablet, Take 1 tablet (10 mg total) by mouth daily., Disp: 90 tablet, Rfl: 3   JARDIANCE  25 MG TABS tablet, Take 25 mg by mouth daily., Disp: , Rfl:    metoprolol  tartrate (LOPRESSOR ) 25 MG tablet, Take 1 tablet (25 mg total) by mouth 2 (two) times daily., Disp: 180 tablet, Rfl: 3   Multiple Vitamin (MULTIVITAMIN ADULT PO), Take 1 tablet by mouth daily., Disp: , Rfl:    magnesium  oxide (MAG-OX) 400 MG tablet, Take 400 mg by mouth daily. (Patient not taking: Reported on 04/21/2024), Disp: , Rfl:    Omega-3 Fatty Acids (FISH OIL) 1000 MG CAPS, Take 1,000 mg by mouth daily. (Patient not taking: Reported on 04/21/2024), Disp: , Rfl:   Past Medical History: Past Medical History:  Diagnosis Date   Coronary artery calcification of native artery    Diabetes mellitus without complication (HCC)    Essential tremor    Hyperlipidemia    Nonobstructive atherosclerosis of coronary artery    Pneumonia    as a child    Tobacco Use: Social History    Tobacco Use  Smoking Status Every Day   Current packs/day: 0.50   Average packs/day: 0.5 packs/day for 40.8 years (20.4 ttl pk-yrs)   Types: Cigarettes   Start date: 32   Last attempt to quit: 2018  Smokeless Tobacco Never    Labs: Review Flowsheet  More data exists      Latest Ref Rng & Units 07/23/2022 09/05/2022 01/28/2024 01/29/2024 01/30/2024  Labs for ITP Cardiac and Pulmonary Rehab  Cholestrol 0 - 200 mg/dL 797  868  - - 62   LDL (calc) 0 - 99 mg/dL 848  74  - - 24   Direct LDL 0 - 99 mg/dL 847  72  - - -  HDL-C >59 mg/dL 35  37  - - 29   Trlycerides <150 mg/dL 86  891  - - 45   Hemoglobin A1c 4.8 - 5.6 % - - 5.5  - -  PH, Arterial 7.35 - 7.45 - - - 7.289  7.379  -  PCO2 arterial 32 - 48 mmHg - - - 40.0  30.1  -  Bicarbonate 20.0 - 28.0 mmol/L - - - 19.0  18.3  -  TCO2 22 - 32 mmol/L - - - 20  19  21  20  24   -  Acid-base deficit 0.0 - 2.0 mmol/L - - - 7.0  7.0  -  O2 Saturation % - - -  91  97  -    Details       Multiple values from one day are sorted in reverse-chronological order         Capillary Blood Glucose: Lab Results  Component Value Date   GLUCAP 140 (H) 02/02/2024   GLUCAP 115 (H) 02/01/2024   GLUCAP 101 (H) 02/01/2024   GLUCAP 107 (H) 02/01/2024   GLUCAP 190 (H) 02/01/2024     Exercise Target Goals: Exercise Program Goal: Individual exercise prescription set using results from initial 6 min walk test and THRR while considering  patient's activity barriers and safety.   Exercise Prescription Goal: Initial exercise prescription builds to 30-45 minutes a day of aerobic activity, 2-3 days per week.  Home exercise guidelines will be given to patient during program as part of exercise prescription that the participant will acknowledge.  Activity Barriers & Risk Stratification:  Activity Barriers & Cardiac Risk Stratification - 04/21/24 1137       Activity Barriers & Cardiac Risk Stratification   Activity Barriers Right Hip  Replacement;Incisional Pain    Cardiac Risk Stratification High   <5 METs on         6 Minute Walk:  6 Minute Walk     Row Name 04/21/24 1322         6 Minute Walk   Phase Initial     Distance 1500 feet     Walk Time 6 minutes     # of Rest Breaks 0     MPH 2.84     METS 4.1     RPE 11     Perceived Dyspnea  0     VO2 Peak 14.27     Symptoms No     Resting HR 73 bpm     Resting BP 102/68     Resting Oxygen Saturation  97 %     Exercise Oxygen Saturation  during 6 min walk 97 %     Max Ex. HR 90 bpm     Max Ex. BP 130/70     2 Minute Post BP 112/70        Oxygen Initial Assessment:   Oxygen Re-Evaluation:   Oxygen Discharge (Final Oxygen Re-Evaluation):   Initial Exercise Prescription:  Initial Exercise Prescription - 04/21/24 1100       Date of Initial Exercise RX and Referring Provider   Date 04/21/24    Referring Provider Peter Swazil[place], MD    Expected Discharge Date 07/16/24      Recumbant Bike   Level 2    RPM 50    Watts 30    Minutes 15    METs 2.5      NuStep   Level 2    SPM 75    Minutes 15    METs 2.5      Prescription Details   Frequency (times per week) 3    Duration Progress to 30 minutes of continuous aerobic without signs/symptoms of physical distress      Intensity   THRR 40-80% of Max Heartrate 66-132    Ratings of Perceived Exertion 11-13    Perceived Dyspnea 0-4      Progression   Progression Continue progressive overload as per policy without signs/symptoms or physical distress.      Resistance Training   Training Prescription Yes    Weight 3    Reps 10-15          Perform Capillary Blood Glucose checks as needed.  Exercise Prescription Changes:   Exercise Comments:   Exercise Goals and Review:   Exercise Goals     Row Name 04/21/24 1138             Exercise Goals   Increase Physical Activity Yes       Intervention Provide advice, education, support and counseling about physical  activity/exercise needs.;Develop an individualized exercise prescription for aerobic and resistive training based on initial evaluation findings, risk stratification, comorbidities and participant's personal goals.       Expected Outcomes Short Term: Attend rehab on a regular basis to increase amount of physical activity.;Long Term: Exercising regularly at least 3-5 days a week.;Long Term: Add in home exercise to make exercise part of routine and to increase amount of physical activity.       Increase Strength and Stamina Yes       Intervention Develop an individualized exercise prescription for aerobic and resistive training based on initial evaluation findings, risk stratification, comorbidities and participant's personal goals.;Provide advice, education, support and counseling about physical activity/exercise needs.       Expected Outcomes Long Term: Improve cardiorespiratory fitness, muscular endurance and strength as measured by increased METs and functional capacity ( );Short Term: Perform resistance training exercises routinely during rehab and add in resistance training at home;Short Term: Increase workloads from initial exercise prescription for resistance, speed, and METs.       Able to understand and use rate of perceived exertion (RPE) scale Yes       Intervention Provide education and explanation on how to use RPE scale       Expected Outcomes Long Term:  Able to use RPE to guide intensity level when exercising independently;Short Term: Able to use RPE daily in rehab to express subjective intensity level       Knowledge and understanding of Target Heart Rate Range (THRR) Yes       Intervention Provide education and explanation of THRR including how the numbers were predicted and where they are located for reference       Expected Outcomes Long Term: Able to use THRR to govern intensity when exercising independently;Short Term: Able to state/look up THRR;Short Term: Able to use daily as  guideline for intensity in rehab       Understanding of Exercise Prescription Yes       Intervention Provide education, explanation, and written materials on patient's individual exercise prescription       Expected Outcomes Short Term: Able to explain program exercise prescription;Long Term: Able to explain home exercise prescription to exercise independently          Exercise Goals Re-Evaluation :   Discharge Exercise Prescription (Final Exercise Prescription Changes):   Nutrition:  Target Goals: Understanding of nutrition guidelines, daily intake of sodium 1500mg , cholesterol 200mg , calories 30% from fat and 7% or less from saturated fats, daily to have 5 or more servings of fruits and vegetables.  Biometrics:  Pre Biometrics - 04/21/24 1132       Pre Biometrics   Waist Circumference 39.25 inches    Hip Circumference 41 inches    Waist to Hip Ratio 0.96 %    Triceps Skinfold 17 mm    % Body Fat 26.4 %    Grip Strength 33 kg    Flexibility 14 in    Single Leg Stand 30 seconds           Nutrition Therapy Plan and Nutrition Goals:   Nutrition Assessments:  MEDIFICTS Score Key: >=70  Need to make dietary changes  40-70 Heart Healthy Diet <= 40 Therapeutic Level Cholesterol Diet    Picture Your Plate Scores: <59 Unhealthy dietary pattern with much room for improvement. 41-50 Dietary pattern unlikely to meet recommendations for good health and room for improvement. 51-60 More healthful dietary pattern, with some room for improvement.  >60 Healthy dietary pattern, although there may be some specific behaviors that could be improved.    Nutrition Goals Re-Evaluation:   Nutrition Goals Re-Evaluation:   Nutrition Goals Discharge (Final Nutrition Goals Re-Evaluation):   Psychosocial: Target Goals: Acknowledge presence or absence of significant depression and/or stress, maximize coping skills, provide positive support system. Participant is able to verbalize  types and ability to use techniques and skills needed for reducing stress and depression.  Initial Review & Psychosocial Screening:  Initial Psych Review & Screening - 04/21/24 1138       Initial Review   Current issues with None Identified      Family Dynamics   Good Support System? Yes   wife     Barriers   Psychosocial barriers to participate in program There are no identifiable barriers or psychosocial needs.      Screening Interventions   Interventions Encouraged to exercise;Provide feedback about the scores to participant    Expected Outcomes Long Term goal: The participant improves quality of Life and PHQ9 Scores as seen by post scores and/or verbalization of changes;Short Term goal: Identification and review with participant of any Quality of Life or Depression concerns found by scoring the questionnaire.          Quality of Life Scores:  Quality of Life - 04/21/24 1323       Quality of Life   Select Quality of Life      Quality of Life Scores   Health/Function Pre 20.63 %    Socioeconomic Pre 26.21 %    Psych/Spiritual Pre 24.5 %    Family Pre 26 %    GLOBAL Pre 23.2 %         Scores of 19 and below usually indicate a poorer quality of life in these areas.  A difference of  2-3 points is a clinically meaningful difference.  A difference of 2-3 points in the total score of the Quality of Life Index has been associated with significant improvement in overall quality of life, self-image, physical symptoms, and general health in studies assessing change in quality of life.  PHQ-9: Review Flowsheet       04/21/2024  Depression screen PHQ 2/9  Decreased Interest 0  Down, Depressed, Hopeless 0  PHQ - 2 Score 0  Altered sleeping 0  Tired, decreased energy 1  Change in appetite 0  Feeling bad or failure about yourself  0  Trouble concentrating 0  Moving slowly or fidgety/restless 1  Suicidal thoughts 0  PHQ-9 Score 2  Difficult doing work/chores Not difficult  at all   Interpretation of Total Score  Total Score Depression Severity:  1-4 = Minimal depression, 5-9 = Mild depression, 10-14 = Moderate depression, 15-19 = Moderately severe depression, 20-27 = Severe depression   Psychosocial Evaluation and Intervention:   Psychosocial Re-Evaluation:   Psychosocial Discharge (Final Psychosocial Re-Evaluation):   Vocational Rehabilitation: Provide vocational rehab assistance to qualifying candidates.   Vocational Rehab Evaluation & Intervention:  Vocational Rehab - 04/21/24 1138       Initial Vocational Rehab Evaluation & Intervention   Assessment shows need for Vocational Rehabilitation No   returning to work  12/25         Education: Education Goals: Education classes will be provided on a weekly basis, covering required topics. Participant will state understanding/return demonstration of topics presented.     Core Videos: Exercise    Move It!  Clinical staff conducted group or individual video education with verbal and written material and guidebook.  Patient learns the recommended Pritikin exercise program. Exercise with the goal of living a long, healthy life. Some of the health benefits of exercise include controlled diabetes, healthier blood pressure levels, improved cholesterol levels, improved heart and lung capacity, improved sleep, and better body composition. Everyone should speak with their doctor before starting or changing an exercise routine.  Biomechanical Limitations Clinical staff conducted group or individual video education with verbal and written material and guidebook.  Patient learns how biomechanical limitations can impact exercise and how we can mitigate and possibly overcome limitations to have an impactful and balanced exercise routine.  Body Composition Clinical staff conducted group or individual video education with verbal and written material and guidebook.  Patient learns that body composition (ratio  of muscle mass to fat mass) is a key component to assessing overall fitness, rather than body weight alone. Increased fat mass, especially visceral belly fat, can put us  at increased risk for metabolic syndrome, type 2 diabetes, heart disease, and even death. It is recommended to combine diet and exercise (cardiovascular and resistance training) to improve your body composition. Seek guidance from your physician and exercise physiologist before implementing an exercise routine.  Exercise Action Plan Clinical staff conducted group or individual video education with verbal and written material and guidebook.  Patient learns the recommended strategies to achieve and enjoy long-term exercise adherence, including variety, self-motivation, self-efficacy, and positive decision making. Benefits of exercise include fitness, good health, weight management, more energy, better sleep, less stress, and overall well-being.  Medical   Heart Disease Risk Reduction Clinical staff conducted group or individual video education with verbal and written material and guidebook.  Patient learns our heart is our most vital organ as it circulates oxygen, nutrients, white blood cells, and hormones throughout the entire body, and carries waste away. Data supports a plant-based eating plan like the Pritikin Program for its effectiveness in slowing progression of and reversing heart disease. The video provides a number of recommendations to address heart disease.   Metabolic Syndrome and Belly Fat  Clinical staff conducted group or individual video education with verbal and written material and guidebook.  Patient learns what metabolic syndrome is, how it leads to heart disease, and how one can reverse it and keep it from coming back. You have metabolic syndrome if you have 3 of the following 5 criteria: abdominal obesity, high blood pressure, high triglycerides, low HDL cholesterol, and high blood sugar.  Hypertension and Heart  Disease Clinical staff conducted group or individual video education with verbal and written material and guidebook.  Patient learns that high blood pressure, or hypertension, is very common in the [place] . Hypertension is largely due to excessive salt intake, but other important risk factors include being overweight, physical inactivity, drinking too much alcohol, smoking, and not eating enough potassium from fruits and vegetables. High blood pressure is a leading risk factor for heart attack, stroke, congestive heart failure, dementia, kidney failure, and premature death. Long-term effects of excessive salt intake include stiffening of the arteries and thickening of heart muscle and organ damage. Recommendations include ways to reduce hypertension and the risk of heart disease.  Diseases of Our Time - Focusing on Diabetes Clinical staff conducted group or individual video education with verbal and written material and guidebook.  Patient learns why the best way to stop diseases of our time is prevention, through food and other lifestyle changes. Medicine (such as prescription pills and surgeries) is often only a Band-Aid on the problem, not a long-term solution. Most common diseases of our time include obesity, type 2 diabetes, hypertension, heart disease, and cancer. The Pritikin Program is recommended and has been proven to help reduce, reverse, and/or prevent the damaging effects of metabolic syndrome.  Nutrition   Overview of the Pritikin Eating Plan  Clinical staff conducted group or individual video education with verbal and written material and guidebook.  Patient learns about the Pritikin Eating Plan for disease risk reduction. The Pritikin Eating Plan emphasizes a wide variety of unrefined, minimally-processed carbohydrates, like fruits, vegetables, whole grains, and legumes. Go, Caution, and Stop food choices are explained. Plant-based and lean animal proteins are emphasized. Rationale  provided for low sodium intake for blood pressure control, low added sugars for blood sugar stabilization, and low added fats and oils for coronary artery disease risk reduction and weight management.  Calorie Density  Clinical staff conducted group or individual video education with verbal and written material and guidebook.  Patient learns about calorie density and how it impacts the Pritikin Eating Plan. Knowing the characteristics of the food you choose will help you decide whether those foods will lead to weight gain or weight loss, and whether you want to consume more or less of them. Weight loss is usually a side effect of the Pritikin Eating Plan because of its focus on low calorie-dense foods.  Label Reading  Clinical staff conducted group or individual video education with verbal and written material and guidebook.  Patient learns about the Pritikin recommended label reading guidelines and corresponding recommendations regarding calorie density, added sugars, sodium content, and whole grains.  Dining Out - Part 1  Clinical staff conducted group or individual video education with verbal and written material and guidebook.  Patient learns that restaurant meals can be sabotaging because they can be so high in calories, fat, sodium, and/or sugar. Patient learns recommended strategies on how to positively address this and avoid unhealthy pitfalls.  Facts on Fats  Clinical staff conducted group or individual video education with verbal and written material and guidebook.  Patient learns that lifestyle modifications can be just as effective, if not more so, as many medications for lowering your risk of heart disease. A Pritikin lifestyle can help to reduce your risk of inflammation and atherosclerosis (cholesterol build-up, or plaque, in the artery walls). Lifestyle interventions such as dietary choices and physical activity address the cause of atherosclerosis. A review of the types of fats and  their impact on blood cholesterol levels, along with dietary recommendations to reduce fat intake is also included.  Nutrition Action Plan  Clinical staff conducted group or individual video education with verbal and written material and guidebook.  Patient learns how to incorporate Pritikin recommendations into their lifestyle. Recommendations include planning and keeping personal health goals in mind as an important part of their success.  Healthy Mind-Set    Healthy Minds, Bodies, Hearts  Clinical staff conducted group or individual video education with verbal and written material and guidebook.  Patient learns how to identify when they are stressed. Video will discuss the impact of that stress, as well as the many benefits of stress management. Patient will  also be introduced to stress management techniques. The way we think, act, and feel has an impact on our hearts.  How Our Thoughts Can Heal Our Hearts  Clinical staff conducted group or individual video education with verbal and written material and guidebook.  Patient learns that negative thoughts can cause depression and anxiety. This can result in negative lifestyle behavior and serious health problems. Cognitive behavioral therapy is an effective method to help control our thoughts in order to change and improve our emotional outlook.  Additional Videos:  Exercise    Improving Performance  Clinical staff conducted group or individual video education with verbal and written material and guidebook.  Patient learns to use a non-linear approach by alternating intensity levels and lengths of time spent exercising to help burn more calories and lose more body fat. Cardiovascular exercise helps improve heart health, metabolism, hormonal balance, blood sugar control, and recovery from fatigue. Resistance training improves strength, endurance, balance, coordination, reaction time, metabolism, and muscle mass. Flexibility exercise improves  circulation, posture, and balance. Seek guidance from your physician and exercise physiologist before implementing an exercise routine and learn your capabilities and proper form for all exercise.  Introduction to Yoga  Clinical staff conducted group or individual video education with verbal and written material and guidebook.  Patient learns about yoga, a discipline of the coming together of mind, breath, and body. The benefits of yoga include improved flexibility, improved range of motion, better posture and core strength, increased lung function, weight loss, and positive self-image. Yoga's heart health benefits include lowered blood pressure, healthier heart rate, decreased cholesterol and triglyceride levels, improved immune function, and reduced stress. Seek guidance from your physician and exercise physiologist before implementing an exercise routine and learn your capabilities and proper form for all exercise.  Medical   Aging: Enhancing Your Quality of Life  Clinical staff conducted group or individual video education with verbal and written material and guidebook.  Patient learns key strategies and recommendations to stay in good physical health and enhance quality of life, such as prevention strategies, having an advocate, securing a Health Care Proxy and Power of Attorney, and keeping a list of medications and system for tracking them. It also discusses how to avoid risk for bone loss.  Biology of Weight Control  Clinical staff conducted group or individual video education with verbal and written material and guidebook.  Patient learns that weight gain occurs because we consume more calories than we burn (eating more, moving less). Even if your body weight is normal, you may have higher ratios of fat compared to muscle mass. Too much body fat puts you at increased risk for cardiovascular disease, heart attack, stroke, type 2 diabetes, and obesity-related cancers. In addition to exercise,  following the Pritikin Eating Plan can help reduce your risk.  Decoding Lab Results  Clinical staff conducted group or individual video education with verbal and written material and guidebook.  Patient learns that lab test reflects one measurement whose values change over time and are influenced by many factors, including medication, stress, sleep, exercise, food, hydration, pre-existing medical conditions, and more. It is recommended to use the knowledge from this video to become more involved with your lab results and evaluate your numbers to speak with your doctor.   Diseases of Our Time - Overview  Clinical staff conducted group or individual video education with verbal and written material and guidebook.  Patient learns that according to the CDC, 50% to 70% of chronic diseases (such as  obesity, type 2 diabetes, elevated lipids, hypertension, and heart disease) are avoidable through lifestyle improvements including healthier food choices, listening to satiety cues, and increased physical activity.  Sleep Disorders Clinical staff conducted group or individual video education with verbal and written material and guidebook.  Patient learns how good quality and duration of sleep are important to overall health and well-being. Patient also learns about sleep disorders and how they impact health along with recommendations to address them, including discussing with a physician.  Nutrition  Dining Out - Part 2 Clinical staff conducted group or individual video education with verbal and written material and guidebook.  Patient learns how to plan ahead and communicate in order to maximize their dining experience in a healthy and nutritious manner. Included are recommended food choices based on the type of restaurant the patient is visiting.   Fueling a Banker conducted group or individual video education with verbal and written material and guidebook.  There is a strong  connection between our food choices and our health. Diseases like obesity and type 2 diabetes are very prevalent and are in large-part due to lifestyle choices. The Pritikin Eating Plan provides plenty of food and hunger-curbing satisfaction. It is easy to follow, affordable, and helps reduce health risks.  Menu Workshop  Clinical staff conducted group or individual video education with verbal and written material and guidebook.  Patient learns that restaurant meals can sabotage health goals because they are often packed with calories, fat, sodium, and sugar. Recommendations include strategies to plan ahead and to communicate with the manager, chef, or server to help order a healthier meal.  Planning Your Eating Strategy  Clinical staff conducted group or individual video education with verbal and written material and guidebook.  Patient learns about the Pritikin Eating Plan and its benefit of reducing the risk of disease. The Pritikin Eating Plan does not focus on calories. Instead, it emphasizes high-quality, nutrient-rich foods. By knowing the characteristics of the foods, we choose, we can determine their calorie density and make informed decisions.  Targeting Your Nutrition Priorities  Clinical staff conducted group or individual video education with verbal and written material and guidebook.  Patient learns that lifestyle habits have a tremendous impact on disease risk and progression. This video provides eating and physical activity recommendations based on your personal health goals, such as reducing LDL cholesterol, losing weight, preventing or controlling type 2 diabetes, and reducing high blood pressure.  Vitamins and Minerals  Clinical staff conducted group or individual video education with verbal and written material and guidebook.  Patient learns different ways to obtain key vitamins and minerals, including through a recommended healthy diet. It is important to discuss all supplements  you take with your doctor.   Healthy Mind-Set    Smoking Cessation  Clinical staff conducted group or individual video education with verbal and written material and guidebook.  Patient learns that cigarette smoking and tobacco addiction pose a serious health risk which affects millions of people. Stopping smoking will significantly reduce the risk of heart disease, lung disease, and many forms of cancer. Recommended strategies for quitting are covered, including working with your doctor to develop a successful plan.  Culinary   Becoming a Set designer conducted group or individual video education with verbal and written material and guidebook.  Patient learns that cooking at home can be healthy, cost-effective, quick, and puts them in control. Keys to cooking healthy recipes will include looking at your recipe,  assessing your equipment needs, planning ahead, making it simple, choosing cost-effective seasonal ingredients, and limiting the use of added fats, salts, and sugars.  Cooking - Breakfast and Snacks  Clinical staff conducted group or individual video education with verbal and written material and guidebook.  Patient learns how important breakfast is to satiety and nutrition through the entire day. Recommendations include key foods to eat during breakfast to help stabilize blood sugar levels and to prevent overeating at meals later in the day. Planning ahead is also a key component.  Cooking - Educational psychologist conducted group or individual video education with verbal and written material and guidebook.  Patient learns eating strategies to improve overall health, including an approach to cook more at home. Recommendations include thinking of animal protein as a side on your plate rather than center stage and focusing instead on lower calorie dense options like vegetables, fruits, whole grains, and plant-based proteins, such as beans. Making sauces in large  quantities to freeze for later and leaving the skin on your vegetables are also recommended to maximize your experience.  Cooking - Healthy Salads and Dressing Clinical staff conducted group or individual video education with verbal and written material and guidebook.  Patient learns that vegetables, fruits, whole grains, and legumes are the foundations of the Pritikin Eating Plan. Recommendations include how to incorporate each of these in flavorful and healthy salads, and how to create homemade salad dressings. Proper handling of ingredients is also covered. Cooking - Soups and [employer] - Soups and Desserts Clinical staff conducted group or individual video education with verbal and written material and guidebook.  Patient learns that Pritikin soups and desserts make for easy, nutritious, and delicious snacks and meal components that are low in sodium, fat, sugar, and calorie density, while high in vitamins, minerals, and filling fiber. Recommendations include simple and healthy ideas for soups and desserts.   Overview     The Pritikin Solution Program Overview Clinical staff conducted group or individual video education with verbal and written material and guidebook.  Patient learns that the results of the Pritikin Program have been documented in more than 100 articles published in peer-reviewed journals, and the benefits include reducing risk factors for (and, in some cases, even reversing) high cholesterol, high blood pressure, type 2 diabetes, obesity, and more! An overview of the three key pillars of the Pritikin Program will be covered: eating well, doing regular exercise, and having a healthy mind-set.  WORKSHOPS  Exercise: Exercise Basics: Building Your Action Plan Clinical staff led group instruction and group discussion with PowerPoint presentation and patient guidebook. To enhance the learning environment the use of posters, models and videos may be added. At the conclusion of  this workshop, patients will comprehend the difference between physical activity and exercise, as well as the benefits of incorporating both, into their routine. Patients will understand the FITT (Frequency, Intensity, Time, and Type) principle and how to use it to build an exercise action plan. In addition, safety concerns and other considerations for exercise and cardiac rehab will be addressed by the presenter. The purpose of this lesson is to promote a comprehensive and effective weekly exercise routine in order to improve patients' overall level of fitness.   Managing Heart Disease: Your Path to a Healthier Heart Clinical staff led group instruction and group discussion with PowerPoint presentation and patient guidebook. To enhance the learning environment the use of posters, models and videos may be added.At the conclusion of  this workshop, patients will understand the anatomy and physiology of the heart. Additionally, they will understand how Pritikin's three pillars impact the risk factors, the progression, and the management of heart disease.  The purpose of this lesson is to provide a high-level overview of the heart, heart disease, and how the Pritikin lifestyle positively impacts risk factors.  Exercise Biomechanics Clinical staff led group instruction and group discussion with PowerPoint presentation and patient guidebook. To enhance the learning environment the use of posters, models and videos may be added. Patients will learn how the structural parts of their bodies function and how these functions impact their daily activities, movement, and exercise. Patients will learn how to promote a neutral spine, learn how to manage pain, and identify ways to improve their physical movement in order to promote healthy living. The purpose of this lesson is to expose patients to common physical limitations that impact physical activity. Participants will learn practical ways to adapt and  manage aches and pains, and to minimize their effect on regular exercise. Patients will learn how to maintain good posture while sitting, walking, and lifting.  Balance Training and Fall Prevention  Clinical staff led group instruction and group discussion with PowerPoint presentation and patient guidebook. To enhance the learning environment the use of posters, models and videos may be added. At the conclusion of this workshop, patients will understand the importance of their sensorimotor skills (vision, proprioception, and the vestibular system) in maintaining their ability to balance as they age. Patients will apply a variety of balancing exercises that are appropriate for their current level of function. Patients will understand the common causes for poor balance, possible solutions to these problems, and ways to modify their physical environment in order to minimize their fall risk. The purpose of this lesson is to teach patients about the importance of maintaining balance as they age and ways to minimize their risk of falling.  WORKSHOPS   Nutrition:  Fueling a Ship broker led group instruction and group discussion with PowerPoint presentation and patient guidebook. To enhance the learning environment the use of posters, models and videos may be added. Patients will review the foundational principles of the Pritikin Eating Plan and understand what constitutes a serving size in each of the food groups. Patients will also learn Pritikin-friendly foods that are better choices when away from home and review make-ahead meal and snack options. Calorie density will be reviewed and applied to three nutrition priorities: weight maintenance, weight loss, and weight gain. The purpose of this lesson is to reinforce (in a group setting) the key concepts around what patients are recommended to eat and how to apply these guidelines when away from home by planning and selecting Pritikin-friendly  options. Patients will understand how calorie density may be adjusted for different weight management goals.  Mindful Eating  Clinical staff led group instruction and group discussion with PowerPoint presentation and patient guidebook. To enhance the learning environment the use of posters, models and videos may be added. Patients will briefly review the concepts of the Pritikin Eating Plan and the importance of low-calorie dense foods. The concept of mindful eating will be introduced as well as the importance of paying attention to internal hunger signals. Triggers for non-hunger eating and techniques for dealing with triggers will be explored. The purpose of this lesson is to provide patients with the opportunity to review the basic principles of the Pritikin Eating Plan, discuss the value of eating mindfully and how to measure internal  cues of hunger and fullness using the Hunger Scale. Patients will also discuss reasons for non-hunger eating and learn strategies to use for controlling emotional eating.  Targeting Your Nutrition Priorities Clinical staff led group instruction and group discussion with PowerPoint presentation and patient guidebook. To enhance the learning environment the use of posters, models and videos may be added. Patients will learn how to determine their genetic susceptibility to disease by reviewing their family history. Patients will gain insight into the importance of diet as part of an overall healthy lifestyle in mitigating the impact of genetics and other environmental insults. The purpose of this lesson is to provide patients with the opportunity to assess their personal nutrition priorities by looking at their family history, their own health history and current risk factors. Patients will also be able to discuss ways of prioritizing and modifying the Pritikin Eating Plan for their highest risk areas  Menu  Clinical staff led group instruction and group discussion with  PowerPoint presentation and patient guidebook. To enhance the learning environment the use of posters, models and videos may be added. Using menus brought in from [employer], or printed from [employer], patients will apply the Pritikin dining out guidelines that were presented in the [employer] video. Patients will also be able to practice these guidelines in a variety of provided scenarios. The purpose of this lesson is to provide patients with the opportunity to practice hands-on learning of the Pritikin Dining Out guidelines with actual menus and practice scenarios.  Label Reading Clinical staff led group instruction and group discussion with PowerPoint presentation and patient guidebook. To enhance the learning environment the use of posters, models and videos may be added. Patients will review and discuss the Pritikin label reading guidelines presented in Pritikin's Label Reading Educational series video. Using fool labels brought in from local grocery stores and markets, patients will apply the label reading guidelines and determine if the packaged food meet the Pritikin guidelines. The purpose of this lesson is to provide patients with the opportunity to review, discuss, and practice hands-on learning of the Pritikin Label Reading guidelines with actual packaged food labels. Cooking School  Pritikin's [employer] are designed to teach patients ways to prepare quick, simple, and affordable recipes at home. The importance of nutrition's role in chronic disease risk reduction is reflected in its emphasis in the overall Pritikin program. By learning how to prepare essential core Pritikin Eating Plan recipes, patients will increase control over what they eat; be able to customize the flavor of foods without the use of added salt, sugar, or fat; and improve the quality of the food they consume. By learning a set of core recipes which are easily assembled, quickly  prepared, and affordable, patients are more likely to prepare more healthy foods at home. These workshops focus on convenient breakfasts, simple entres, side dishes, and desserts which can be prepared with minimal effort and are consistent with nutrition recommendations for cardiovascular risk reduction. Cooking [employer] are taught by a Armed forces logistics/support/administrative officer (RD) who has been trained by the [employer]. The chef or RD has a clear understanding of the importance of minimizing - if not completely eliminating - added fat, sugar, and sodium in recipes. Throughout the series of Cooking School Workshop sessions, patients will learn about healthy ingredients and efficient methods of cooking to build confidence in their capability to prepare    Cooking School weekly topics:  Adding Flavor- Sodium-Free  Fast  and Healthy Breakfasts  Powerhouse Plant-Based Proteins  Satisfying Salads and Dressings  Simple Sides and Sauces  International Cuisine-Spotlight on the Blue Zones  Delicious Desserts  Savory Soups  Efficiency Cooking - Meals in a Snap  Tasty Appetizers and Snacks  Comforting Weekend Breakfasts  One-Pot Wonders   Fast Evening Meals  Landscape architect Your Pritikin Plate  WORKSHOPS   Healthy Mindset (Psychosocial):  Focused Goals, Sustainable Changes Clinical staff led group instruction and group discussion with PowerPoint presentation and patient guidebook. To enhance the learning environment the use of posters, models and videos may be added. Patients will be able to apply effective goal setting strategies to establish at least one personal goal, and then take consistent, meaningful action toward that goal. They will learn to identify common barriers to achieving personal goals and develop strategies to overcome them. Patients will also gain an understanding of how our mind-set can impact our ability to achieve goals and the importance of  cultivating a positive and growth-oriented mind-set. The purpose of this lesson is to provide patients with a deeper understanding of how to set and achieve personal goals, as well as the tools and strategies needed to overcome common obstacles which may arise along the way.  From Head to Heart: The Power of a Healthy Outlook  Clinical staff led group instruction and group discussion with PowerPoint presentation and patient guidebook. To enhance the learning environment the use of posters, models and videos may be added. Patients will be able to recognize and describe the impact of emotions and mood on physical health. They will discover the importance of self-care and explore self-care practices which may work for them. Patients will also learn how to utilize the 4 C's to cultivate a healthier outlook and better manage stress and challenges. The purpose of this lesson is to demonstrate to patients how a healthy outlook is an essential part of maintaining good health, especially as they continue their cardiac rehab journey.  Healthy Sleep for a Healthy Heart Clinical staff led group instruction and group discussion with PowerPoint presentation and patient guidebook. To enhance the learning environment the use of posters, models and videos may be added. At the conclusion of this workshop, patients will be able to demonstrate knowledge of the importance of sleep to overall health, well-being, and quality of life. They will understand the symptoms of, and treatments for, common sleep disorders. Patients will also be able to identify daytime and nighttime behaviors which impact sleep, and they will be able to apply these tools to help manage sleep-related challenges. The purpose of this lesson is to provide patients with a general overview of sleep and outline the importance of quality sleep. Patients will learn about a few of the most common sleep disorders. Patients will also be introduced to the concept of  "sleep hygiene," and discover ways to self-manage certain sleeping problems through simple daily behavior changes. Finally, the workshop will motivate patients by clarifying the links between quality sleep and their goals of heart-healthy living.   Recognizing and Reducing Stress Clinical staff led group instruction and group discussion with PowerPoint presentation and patient guidebook. To enhance the learning environment the use of posters, models and videos may be added. At the conclusion of this workshop, patients will be able to understand the types of stress reactions, differentiate between acute and chronic stress, and recognize the impact that chronic stress has on their health. They will also be able to apply different coping mechanisms, such as  reframing negative self-talk. Patients will have the opportunity to practice a variety of stress management techniques, such as deep abdominal breathing, progressive muscle relaxation, and/or guided imagery.  The purpose of this lesson is to educate patients on the role of stress in their lives and to provide healthy techniques for coping with it.  Learning Barriers/Preferences:  Learning Barriers/Preferences - 04/21/24 1138       Learning Barriers/Preferences   Learning Barriers Sight    Learning Preferences Computer/Internet;Group Instruction;Individual Instruction;Skilled Demonstration;Verbal Instruction;Written Material;Video;Pictoral          Education Topics:  Knowledge Questionnaire Score:  Knowledge Questionnaire Score - 04/21/24 1148       Knowledge Questionnaire Score   Pre Score 22/24          Core Components/Risk Factors/Patient Goals at Admission:  Personal Goals and Risk Factors at Admission - 04/21/24 1139       Core Components/Risk Factors/Patient Goals on Admission    Weight Management Yes;Weight Maintenance    Intervention Weight Management: Develop a combined nutrition and exercise program designed to reach  desired caloric intake, while maintaining appropriate intake of nutrient and fiber, sodium and fats, and appropriate energy expenditure required for the weight goal.;Weight Management: Provide education and appropriate resources to help participant work on and attain dietary goals.    Expected Outcomes Short Term: Continue to assess and modify interventions until short term weight is achieved;Long Term: Adherence to nutrition and physical activity/exercise program aimed toward attainment of established weight goal;Weight Maintenance: Understanding of the daily nutrition guidelines, which includes 25-35% calories from fat, 7% or less cal from saturated fats, less than 200mg  cholesterol, less than 1.5gm of sodium, & 5 or more servings of fruits and vegetables daily;Understanding recommendations for meals to include 15-35% energy as protein, 25-35% energy from fat, 35-60% energy from carbohydrates, less than 200mg  of dietary cholesterol, 20-35 gm of total fiber daily;Understanding of distribution of calorie intake throughout the day with the consumption of 4-5 meals/snacks    Tobacco Cessation Yes    Number of packs per day 5 cigarettes a day    Intervention Assist the participant in steps to quit. Provide individualized education and counseling about committing to Tobacco Cessation, relapse prevention, and pharmacological support that can be provided by physician.;Education officer, environmental, assist with locating and accessing local/national Quit Smoking programs, and support quit date choice.    Expected Outcomes Short Term: Will demonstrate readiness to quit, by selecting a quit date.;Long Term: Complete abstinence from all tobacco products for at least 12 months from quit date.;Short Term: Will quit all tobacco product use, adhering to prevention of relapse plan.    Diabetes Yes    Intervention Provide education about signs/symptoms and action to take for hypo/hyperglycemia.;Provide education about proper  nutrition, including hydration, and aerobic/resistive exercise prescription along with prescribed medications to achieve blood glucose in normal ranges: Fasting glucose 65-99 mg/dL    Expected Outcomes Short Term: Participant verbalizes understanding of the signs/symptoms and immediate care of hyper/hypoglycemia, proper foot care and importance of medication, aerobic/resistive exercise and nutrition plan for blood glucose control.;Long Term: Attainment of HbA1C < 7%.    Hypertension Yes    Intervention Provide education on lifestyle modifcations including regular physical activity/exercise, weight management, moderate sodium restriction and increased consumption of fresh fruit, vegetables, and low fat dairy, alcohol moderation, and smoking cessation.;Monitor prescription use compliance.    Expected Outcomes Long Term: Maintenance of blood pressure at goal levels.;Short Term: Continued assessment and intervention until BP is <  140/75mm HG in hypertensive participants. < 130/30mm HG in hypertensive participants with diabetes, heart failure or chronic kidney disease.    Lipids Yes    Intervention Provide education and support for participant on nutrition & aerobic/resistive exercise along with prescribed medications to achieve LDL 70mg , HDL >40mg .    Expected Outcomes Short Term: Participant states understanding of desired cholesterol values and is compliant with medications prescribed. Participant is following exercise prescription and nutrition guidelines.;Long Term: Cholesterol controlled with medications as prescribed, with individualized exercise RX and with personalized nutrition plan. Value goals: LDL < 70mg , HDL > 40 mg.          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 Introduction to  pritikin education/intensive cardiac rehab. Initial orientation packet reviewed with patient.          Comments: Participant attended orientation for the cardiac rehabilitation program on  04/21/2024  to perform initial intake and exercise walk test. Patient introduced to the [facility] education and orientation packet was reviewed. Completed 6-minute walk test, measurements, initial ITP, and exercise prescription. Vital signs stable. Telemetry-normal sinus rhythm frequent PVCs, asymptomatic.   Service time was from 1030 to 1226.  Con KATHEE Pereyra, MS, ACSM-CEP 04/21/2024 1:25 PM

## 2024-04-22 ENCOUNTER — Emergency Department (HOSPITAL_COMMUNITY)
Admission: EM | Admit: 2024-04-22 | Discharge: 2024-04-23 | Disposition: A | Discharge status: Home / Self Care | Attending: Emergency Medicine | Admitting: Emergency Medicine

## 2024-04-22 ENCOUNTER — Emergency Department (HOSPITAL_COMMUNITY)

## 2024-04-22 ENCOUNTER — Other Ambulatory Visit: Payer: Self-pay

## 2024-04-22 DIAGNOSIS — Z7982 Long term (current) use of aspirin: Secondary | ICD-10-CM | POA: Insufficient documentation

## 2024-04-22 DIAGNOSIS — Z91040 Latex allergy status: Secondary | ICD-10-CM | POA: Diagnosis not present

## 2024-04-22 DIAGNOSIS — I3139 Other pericardial effusion (noninflammatory): Secondary | ICD-10-CM | POA: Diagnosis not present

## 2024-04-22 DIAGNOSIS — J90 Pleural effusion, not elsewhere classified: Secondary | ICD-10-CM | POA: Diagnosis not present

## 2024-04-22 DIAGNOSIS — E119 Type 2 diabetes mellitus without complications: Secondary | ICD-10-CM | POA: Diagnosis not present

## 2024-04-22 DIAGNOSIS — R071 Chest pain on breathing: Secondary | ICD-10-CM

## 2024-04-22 DIAGNOSIS — Z7984 Long term (current) use of oral hypoglycemic drugs: Secondary | ICD-10-CM | POA: Diagnosis not present

## 2024-04-22 DIAGNOSIS — Z951 Presence of aortocoronary bypass graft: Secondary | ICD-10-CM | POA: Diagnosis not present

## 2024-04-22 LAB — HEPATIC FUNCTION PANEL
ALT: 22 U/L (ref 0–44)
AST: 22 U/L (ref 15–41)
Albumin: 3.8 g/dL (ref 3.5–5.0)
Alkaline Phosphatase: 67 U/L (ref 38–126)
Bilirubin, Direct: 0.1 mg/dL (ref 0.0–0.2)
Indirect Bilirubin: 0.7 mg/dL (ref 0.3–0.9)
Total Bilirubin: 0.8 mg/dL (ref 0.0–1.2)
Total Protein: 6.9 g/dL (ref 6.5–8.1)

## 2024-04-22 LAB — BASIC METABOLIC PANEL WITH GFR
Anion gap: 10 (ref 5–15)
BUN: 17 mg/dL (ref 6–20)
CO2: 23 mmol/L (ref 22–32)
Calcium: 8.6 mg/dL — ABNORMAL LOW (ref 8.9–10.3)
Chloride: 102 mmol/L (ref 98–111)
Creatinine, Ser: 1.04 mg/dL (ref 0.61–1.24)
GFR, Estimated: >60 mL/min (ref >60)
Glucose, Bld: 105 mg/dL — ABNORMAL HIGH (ref 70–99)
Potassium: 4.2 mmol/L (ref 3.5–5.1)
Sodium: 135 mmol/L (ref 135–145)

## 2024-04-22 LAB — CBC
HCT: 44.7 % (ref 39.0–52.0)
Hemoglobin: 14.7 g/dL (ref 13.0–17.0)
MCH: 29.2 pg (ref 26.0–34.0)
MCHC: 32.9 g/dL (ref 30.0–36.0)
MCV: 88.9 fL (ref 80.0–100.0)
Platelets: 252 K/uL (ref 150–400)
RBC: 5.03 MIL/uL (ref 4.22–5.81)
RDW: 14.2 % (ref 11.5–15.5)
WBC: 9.6 K/uL (ref 4.0–10.5)
nRBC: 0 % (ref 0.0–0.2)

## 2024-04-22 LAB — TROPONIN I (HIGH SENSITIVITY)
Troponin I (High Sensitivity): 4 ng/L (ref <18)
Troponin I (High Sensitivity): 3 ng/L (ref <18)

## 2024-04-22 NOTE — ED Provider Notes (Signed)
 [place] EMERGENCY DEPARTMENT AT [facility] Provider Note   CSN: [id number] Arrival date & time: 04/22/24  8367     Patient presents with: Chest Pain   Phillip Clark is a 58 y.o. male.  {Add pertinent medical, surgical, social history, OB history to YEP:67052} The history is provided by the patient.  Chest Pain Phillip Clark is a 58 y.o. male who presents to the Emergency Department complaining of *** Chest pain yesterday Constant Slept more than usual  Right upper chest and shoulder Hurts to breathe. Pain with breathing deep.   No fever but feels warm. No sob.  No v/d.  No leg swelling or pain.    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 DM. No hx/o DVT/PE.      Prior to Admission medications   Medication Sig Start Date End Date Taking? Authorizing Provider  acetaminophen  (TYLENOL ) 325 MG tablet Take 2 tablets (650 mg total) by mouth every 6 (six) hours as needed. 02/02/24   Raguel Con RAMAN, PA-C  aspirin  EC 325 MG tablet Take 1 tablet (325 mg total) by mouth daily. 02/02/24   Raguel Con RAMAN, PA-C  atorvastatin  (LIPITOR) 40 MG tablet Take 1 tablet (40 mg total) by mouth at bedtime. 03/17/24   Lelon Hamilton T, PA-C  ezetimibe  (ZETIA ) 10 MG tablet Take 1 tablet (10 mg total) by mouth daily. 03/17/24   Lelon Hamilton T, PA-C  JARDIANCE  25 MG TABS tablet Take 25 mg by mouth daily. 08/01/23   [provider]  magnesium  oxide (MAG-OX) 400 MG tablet Take 400 mg by mouth daily. Patient not taking: Reported on 04/21/2024    [provider]  metoprolol  tartrate (LOPRESSOR ) 25 MG tablet Take 1 tablet (25 mg total) by mouth 2 (two) times daily. 03/17/24   Lelon Hamilton T, PA-C  Multiple Vitamin (MULTIVITAMIN ADULT PO) Take 1 tablet by mouth daily.    [provider]  Omega-3 Fatty Acids (FISH OIL) 1000 MG CAPS Take 1,000 mg by mouth daily. Patient not taking: Reported on 04/21/2024    [provider]    Allergies: Latex, Crestor [rosuvastatin calcium ],  Rosuvastatin, Other, Topiramate, Varenicline, and Tape    Review of Systems  Cardiovascular:  Positive for chest pain.  All other systems reviewed and are negative.   Updated Vital Signs BP 111/74 (BP Location: Left Arm)   Pulse 91   Temp 98.7 F (37.1 C)   Resp 16   Wt 87.6 kg   SpO2 97%   BMI 26.19 kg/m   Physical Exam  (all labs ordered are listed, but only abnormal results are displayed) Labs Reviewed  BASIC METABOLIC PANEL WITH GFR - Abnormal; Notable for the following components:      Result Value   Glucose, Bld 105 (*)    Calcium  8.6 (*)    All other components within normal limits  CBC  TROPONIN I (HIGH SENSITIVITY)  TROPONIN I (HIGH SENSITIVITY)    EKG: EKG Interpretation Date/Time:  Thursday April 22 2024 16:40:27 EDT Ventricular Rate:  97 PR Interval:  150 QRS Duration:  92 QT Interval:  336 QTC Calculation: 426 R Axis:   79  Text Interpretation: Normal sinus rhythm Possible Left atrial enlargement Incomplete right bundle branch block Possible Anterior infarct , age undetermined Abnormal ECG Confirmed by Griselda Norris [phone]) on 04/22/2024 11:02:02 PM  Radiology: DG Chest 2 View Result Date: 04/22/2024 CLINICAL DATA:  Chest pain EXAM: CHEST - 2 VIEW COMPARISON:  03/10/2024 FINDINGS: Heart size is normal.  Status post median sternotomy. New, trace right pleural effusion and associated atelectasis or consolidation. Left lung normally aerated. The visualized skeletal structures are unremarkable. IMPRESSION: New, trace right pleural effusion and associated atelectasis or consolidation. Electronically Signed   By: Marolyn JONETTA Jaksch M.D.   On: 04/22/2024 17:55    {Document cardiac monitor, telemetry assessment procedure when appropriate:32947} Procedures   Medications Ordered in the ED - No data to display    {Click here for ABCD2, HEART and other calculators REFRESH Note before signing:1}                              Medical Decision Making Amount and/or  Complexity of Data Reviewed Labs: ordered. Radiology: ordered.   ***  {Document critical care time when appropriate  Document review of labs and clinical decision tools ie CHADS2VASC2, etc  Document your independent review of radiology images and any outside records  Document your discussion with family members, caretakers and with consultants  Document social determinants of health affecting pt's care  Document your decision making why or why not admission, treatments were needed:32947:::1}   Final diagnoses:  None    ED Discharge Orders     None

## 2024-04-22 NOTE — ED Triage Notes (Signed)
 Pt with hx of cabg this summer is here for stabbing right sided chest pain

## 2024-04-22 NOTE — ED Triage Notes (Signed)
 PT complains of right sided chest pain started yesterday and radiates to left side. Denies radiating anywhere, denies n/v/, denies sob

## 2024-04-23 ENCOUNTER — Emergency Department (HOSPITAL_COMMUNITY)

## 2024-04-23 MED ORDER — IOHEXOL 350 MG/ML SOLN
75.0000 mL | Freq: Once | INTRAVENOUS | Status: AC | PRN
  Administered 2024-04-23: 75 mL via INTRAVENOUS

## 2024-04-23 MED ORDER — COLCHICINE 0.6 MG PO TABS: 0.6000 mg | ORAL_TABLET | Freq: Two times a day (BID) | ORAL | 0 refills | Status: DC

## 2024-04-23 MED ORDER — COLCHICINE 0.6 MG PO TABS: 0.6000 mg | ORAL_TABLET | Freq: Once | ORAL | Status: DC

## 2024-04-23 MED ORDER — COLCHICINE 0.6 MG PO TABS
1.2000 mg | ORAL_TABLET | Freq: Once | ORAL | Status: AC
  Administered 2024-04-23: 1.2 mg via ORAL
  Filled 2024-04-23: qty 2

## 2024-04-23 MED ORDER — COLCHICINE 0.6 MG PO TABS: 0.6000 mg | ORAL_TABLET | Freq: Every day | ORAL | 0 refills | Status: DC

## 2024-04-23 NOTE — ED Notes (Signed)
Patient verbalizes understanding of discharge instructions. Opportunity for questioning and answers were provided. Armband removed by staff, pt discharged from ED. Ambulated out to lobby with friend

## 2024-04-27 ENCOUNTER — Other Ambulatory Visit: Payer: Self-pay | Admitting: Thoracic Surgery (Cardiothoracic Vascular Surgery)

## 2024-04-27 DIAGNOSIS — J90 Pleural effusion, not elsewhere classified: Secondary | ICD-10-CM

## 2024-04-28 ENCOUNTER — Encounter (HOSPITAL_COMMUNITY)
Admission: RE | Admit: 2024-04-28 | Discharge: 2024-04-28 | Disposition: A | Discharge status: Home / Self Care | Source: Ambulatory Visit | Attending: Cardiology | Admitting: Cardiology

## 2024-04-28 DIAGNOSIS — Z951 Presence of aortocoronary bypass graft: Secondary | ICD-10-CM

## 2024-04-28 NOTE — Progress Notes (Signed)
 Incomplete Session Note  Patient Details  Name: Phillip Clark MRN: [id number] Date of Birth: 1966/02/08 Referring Provider:   Flowsheet Row INTENSIVE CARDIAC REHAB ORIENT from 04/21/2024 in [facility]  Referring Provider Peter Sw[place]il[place], MD    Phillip Clark Gentle did not complete his rehab session.  Will hold off on patient starting exercise until follow up visit with [facility] tomorrow. Patient will need clearance to proceed with exercise. Patient states understanding.[name] Elpidio Quan RN BSN

## 2024-04-29 ENCOUNTER — Ambulatory Visit

## 2024-04-29 ENCOUNTER — Other Ambulatory Visit: Payer: Self-pay | Admitting: Thoracic Surgery (Cardiothoracic Vascular Surgery)

## 2024-04-29 ENCOUNTER — Ambulatory Visit (HOSPITAL_COMMUNITY)
Admission: RE | Admit: 2024-04-29 | Discharge: 2024-04-29 | Disposition: A | Discharge status: Home / Self Care | Source: Ambulatory Visit | Attending: Cardiology | Admitting: Cardiology

## 2024-04-29 VITALS — BP 104/69 | HR 70 | Resp 20 | Ht 72.0 in | Wt 188.0 lb

## 2024-04-29 DIAGNOSIS — I2510 Atherosclerotic heart disease of native coronary artery without angina pectoris: Secondary | ICD-10-CM

## 2024-04-29 DIAGNOSIS — I3139 Other pericardial effusion (noninflammatory): Secondary | ICD-10-CM

## 2024-04-29 DIAGNOSIS — J90 Pleural effusion, not elsewhere classified: Secondary | ICD-10-CM | POA: Insufficient documentation

## 2024-04-29 DIAGNOSIS — Z951 Presence of aortocoronary bypass graft: Secondary | ICD-10-CM

## 2024-04-29 MED ORDER — COLCHICINE 0.6 MG PO TABS: 0.6000 mg | ORAL_TABLET | Freq: Two times a day (BID) | ORAL | 2 refills | Status: DC

## 2024-04-29 NOTE — Progress Notes (Signed)
 [address] [place] [address]             [phone]       HPI: Patient returns for follow-up having undergone Off pump CABG X 1, left radial artery to PDA, open left radial artery harvest, and open right greater saphenous vein harvest on 01/29/2024 with Dr. Shyrl. He was seen in clinic on 03/10/2024 and was cleared from our surgical practice.  On 04/22/2024 he presented to the emergency department with chest pain and shortness of breath.  He was found to small right pleural effusion and small pericardial effusion without evidence of cardiac tamponade.  He was started on colchicine for possible pericarditis.   He reports that since ER visit he has noticed improvement of his symptoms.  His chest pain with breathing has lessened while on the colchicine. He does still have some pain when lying flat but this is improving.  He denies shortness of breath and lower leg swelling.      Current Outpatient Medications  Medication Sig Dispense Refill   acetaminophen  (TYLENOL ) 325 MG tablet Take 2 tablets (650 mg total) by mouth every 6 (six) hours as needed.     aspirin  EC 325 MG tablet Take 1 tablet (325 mg total) by mouth daily.     atorvastatin  (LIPITOR) 40 MG tablet Take 1 tablet (40 mg total) by mouth at bedtime. 90 tablet 3   colchicine 0.6 MG tablet Take 1 tablet (0.6 mg total) by mouth 2 (two) times daily. 14 tablet 0   ezetimibe  (ZETIA ) 10 MG tablet Take 1 tablet (10 mg total) by mouth daily. 90 tablet 3   JARDIANCE  25 MG TABS tablet Take 25 mg by mouth daily.     magnesium  oxide (MAG-OX) 400 MG tablet Take 400 mg by mouth daily.     metoprolol  tartrate (LOPRESSOR ) 25 MG tablet Take 1 tablet (25 mg total) by mouth 2 (two) times daily. 180 tablet 3   Multiple Vitamin (MULTIVITAMIN ADULT PO) Take 1 tablet by mouth daily.     Omega-3 Fatty Acids (FISH OIL) 1000 MG CAPS Take 1,000 mg by mouth daily.     No current facility-administered medications for this  visit.   Vitals:   04/29/24 0924  BP: 104/69  Pulse: 70  Resp: 20  SpO2: 100%    Review of Systems  Constitutional: Negative.  Negative for fever and malaise/fatigue.  Respiratory:  Negative for cough and shortness of breath.   Cardiovascular:  Negative for leg swelling.  Neurological: Negative.  Negative for dizziness and headaches.    Physical Exam Constitutional:      Appearance: Normal appearance.  HENT:     Head: Normocephalic and atraumatic.  Cardiovascular:     Rate and Rhythm: Normal rate and regular rhythm.     Heart sounds: Normal heart sounds, S1 normal and S2 normal.  Pulmonary:     Effort: Pulmonary effort is normal.     Breath sounds: Normal breath sounds.  Skin:    General: Skin is warm and dry.  Neurological:     General: No focal deficit present.     Mental Status: He is alert and oriented to person, place, and time.      Diagnostic Tests: EXAM: 2 VIEW(S) XRAY OF THE CHEST 04/29/2024 09:20:01 AM   COMPARISON: Chest CTA 04/23/2024 and earlier.   CLINICAL HISTORY: 58 year old male with pleural effusion.   FINDINGS:  LUNGS AND PLEURA: Improved lung volumes and normalization of right lung base ventilation. No focal pulmonary opacity. No pulmonary edema. No pleural effusion. No pneumothorax.   HEART AND MEDIASTINUM: heart size and mediastinal contours remain normal.   BONES AND SOFT TISSUES: Stable sternotomy. No acute osseous abnormality.   IMPRESSION: 1. Normalized right lung base ventilation. No acute cardiopulmonary abnormality.   Electronically signed by: Helayne Hurst MD 04/29/2024 09:33 AM EDT RP Workstation: [id number]  Plan: Pericardial effusion -Continue with colchicine BID for one week  -Echocardiogram ordered for continued follow up of pericardial effusion; will call with results -Alarm symptoms discussed and he states understanding of proper follow up  Pleural effusion -We reviewed today's chest x-ray which shows  resolution or right sided pleural effusion.  -Continue to be active  S/P CABG x 1 -He is still cleared to participate in cardiac rehab -Continue to be active but know limitations.  Attempt to take breaks if needed while exercising     Manuelita CHRISTELLA Rough, PA-C [facility] [phone]

## 2024-04-29 NOTE — Patient Instructions (Signed)
-  Echocardiogram ordered -Will call with results once test is completed

## 2024-04-30 ENCOUNTER — Encounter (HOSPITAL_COMMUNITY)
Admission: RE | Admit: 2024-04-30 | Discharge: 2024-04-30 | Disposition: A | Discharge status: Home / Self Care | Source: Ambulatory Visit | Attending: Cardiology | Admitting: Cardiology

## 2024-04-30 DIAGNOSIS — Z951 Presence of aortocoronary bypass graft: Secondary | ICD-10-CM

## 2024-04-30 LAB — GLUCOSE, CAPILLARY
Glucose-Capillary: 117 mg/dL — ABNORMAL HIGH (ref 70–99)
Glucose-Capillary: 99 mg/dL (ref 70–99)

## 2024-04-30 NOTE — Progress Notes (Signed)
 Daily Session Note  Patient Details  Name: Phillip Clark MRN: [id number] Date of Birth: 28-Aug-1965 Referring Provider:   Flowsheet Row INTENSIVE CARDIAC REHAB ORIENT from 04/21/2024 in [facility]  Referring Provider Peter Swazil[place], MD    Encounter Date: 04/30/2024  Check In:  Session Check In - 04/30/24 0827       Check-In   Supervising physician immediately available to respond to emergencies [facility] MD immediately available    Physician(s) Rosabel Mose, NP    Location [facility]    Staff Present Arnoldo Gal, MS, ACSM-CEP, Exercise Physiologist;David Makemson, MS, ACSM-CEP, CCRP, Exercise Physiologist;Tieler Cournoyer, RN, BSN;Johnny Fayette, MS, Exercise Physiologist;Joseph Lennon, RN, BSN;Jetta Walker BS, ACSM-CEP, Exercise Physiologist    Virtual Visit No    Medication changes reported     No    Fall or balance concerns reported    No    Tobacco Cessation No Change    Warm-up and Cool-down Performed as group-led instruction   Attended education class, no exercise pending post-hospital f/u.   Resistance Training Performed Yes    VAD Patient? No    PAD/SET Patient? No      Pain Assessment   Currently in Pain? No/denies    Pain Score 0-No pain    Multiple Pain Sites No          Capillary Blood Glucose: Results for orders placed or performed during the hospital encounter of 04/30/24 (from the past 24 hours)  Glucose, capillary     Status: Abnormal   Collection Time: 04/30/24  9:19 AM  Result Value Ref Range   Glucose-Capillary 117 (H) 70 - 99 mg/dL  Glucose, capillary     Status: None   Collection Time: 04/30/24 10:06 AM  Result Value Ref Range   Glucose-Capillary 99 70 - 99 mg/dL      Social History   Tobacco Use  Smoking Status Every Day   Current packs/day: 0.50   Average packs/day: 0.5 packs/day for 40.8 years (20.4 ttl pk-yrs)   Types: Cigarettes   Start date: 1984   Last  attempt to quit: 2018  Smokeless Tobacco Never    Goals Met:  Exercise tolerated well No report of concerns or symptoms today Strength training completed today  Goals Unmet:  Not Applicable  Comments: Pt started cardiac rehab today.  Pt tolerated light exercise without difficulty. VSS, telemetry-Sinus Rhythm, asymptomatic.  Medication list reconciled. Pt denies barriers to medicaiton compliance.  PSYCHOSOCIAL ASSESSMENT:  PHQ-1.  Says that energy level is improving. Pt exhibits positive coping skills, hopeful outlook with supportive family. No psychosocial needs identified at this time, no psychosocial interventions necessary.    Pt enjoys singing, piano music, drawing, art and reading.   Pt oriented to exercise equipment and routine.    Understanding verbalized.[name] Elpidio Quan RN BSN    Dr. Wilbert Bihari is Medical Director for Cardiac Rehab at [facility].

## 2024-05-03 ENCOUNTER — Encounter (HOSPITAL_COMMUNITY)
Admission: RE | Admit: 2024-05-03 | Discharge: 2024-05-03 | Disposition: A | Source: Ambulatory Visit | Attending: Cardiology

## 2024-05-03 DIAGNOSIS — Z951 Presence of aortocoronary bypass graft: Secondary | ICD-10-CM

## 2024-05-03 LAB — GLUCOSE, CAPILLARY
Glucose-Capillary: 108 mg/dL — ABNORMAL HIGH (ref 70–99)
Glucose-Capillary: 83 mg/dL (ref 70–99)

## 2024-05-05 ENCOUNTER — Encounter (HOSPITAL_COMMUNITY)
Admission: RE | Admit: 2024-05-05 | Discharge: 2024-05-05 | Disposition: A | Discharge status: Home / Self Care | Source: Ambulatory Visit | Attending: Cardiology | Admitting: Cardiology

## 2024-05-05 DIAGNOSIS — Z951 Presence of aortocoronary bypass graft: Secondary | ICD-10-CM | POA: Diagnosis not present

## 2024-05-05 NOTE — Progress Notes (Signed)
 Intermittent resting BP's noted in the 90's at cardiac rehab. Asymptomatic. Taking medications as prescribed. Will forward today's vital signs to [name] [facility] for review and continue to monitor the patient.[name] Elpidio Quan RN BSN

## 2024-05-06 ENCOUNTER — Telehealth (HOSPITAL_COMMUNITY): Payer: Self-pay | Admitting: *Deleted

## 2024-05-06 NOTE — Telephone Encounter (Signed)
-----   Message from [name] sent at 05/05/2024  5:38 PM EDT ----- Thanks [name] The metoprolol  is helping suppress his PVCs. If he was asymptomatic, let's just keep monitoring him. If he starts to have symptoms or we see any BPs that are lower, we can reduce the dose. Scott ----- Message ----- From: [name] ORN, RN Sent: 05/05/2024   2:31 PM EDT To: [name] [name], PA-C; [name], CMA  Good afternoon [name],  Phillip Clark started cardiac rehab on 04/30/24 and is off to a good start to exercise. I want to bring it to your attention that Phillip Clark has had some  Intermittent resting BP's noted in the 90's at cardiac rehab. Asymptomatic. Taking medications as prescribed. Phillip Clark is taking 25 mg twice a day and has lost 2.2 kg since starting the program. Please review attached media and advise the patient as necessary.   I appreciate your input with this matter.  Sincerely, [name]

## 2024-05-07 ENCOUNTER — Encounter (HOSPITAL_COMMUNITY)
Admission: RE | Admit: 2024-05-07 | Discharge: 2024-05-07 | Disposition: A | Discharge status: Home / Self Care | Source: Ambulatory Visit | Attending: Cardiology | Admitting: Cardiology

## 2024-05-07 DIAGNOSIS — Z951 Presence of aortocoronary bypass graft: Secondary | ICD-10-CM

## 2024-05-10 ENCOUNTER — Encounter (HOSPITAL_COMMUNITY)
Admission: RE | Admit: 2024-05-10 | Discharge: 2024-05-10 | Disposition: A | Discharge status: Home / Self Care | Source: Ambulatory Visit | Attending: Cardiology | Admitting: Cardiology

## 2024-05-10 DIAGNOSIS — Z951 Presence of aortocoronary bypass graft: Secondary | ICD-10-CM | POA: Diagnosis not present

## 2024-05-12 ENCOUNTER — Encounter (HOSPITAL_COMMUNITY)
Admission: RE | Admit: 2024-05-12 | Discharge: 2024-05-12 | Disposition: A | Discharge status: Home / Self Care | Source: Ambulatory Visit | Attending: Cardiology | Admitting: Cardiology

## 2024-05-12 DIAGNOSIS — Z951 Presence of aortocoronary bypass graft: Secondary | ICD-10-CM

## 2024-05-13 NOTE — Progress Notes (Signed)
 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  Patient's Home Medications on Admission:  Current Outpatient Medications:    acetaminophen  (TYLENOL ) 325 MG tablet, Take 2 tablets (650 mg total) by mouth every 6 (six) hours as needed., Disp: , Rfl:    aspirin  EC 325 MG tablet, Take 1 tablet (325 mg total) by mouth daily., Disp: , Rfl:    atorvastatin  (LIPITOR) 40 MG tablet, Take 1 tablet (40 mg total) by mouth at bedtime., Disp: 90 tablet, Rfl: 3   colchicine 0.6 MG tablet, Take 1 tablet (0.6 mg total) by mouth 2 (two) times daily., Disp: 14 tablet, Rfl: 0   colchicine 0.6 MG tablet, Take 1 tablet (0.6 mg total) by mouth 2 (two) times daily., Disp: 60 tablet, Rfl: 2   ezetimibe  (ZETIA ) 10 MG tablet, Take 1 tablet (10 mg total) by mouth daily., Disp: 90 tablet, Rfl: 3   JARDIANCE  25 MG TABS tablet, Take 25 mg by mouth daily., Disp: , Rfl:    magnesium  oxide (MAG-OX) 400 MG tablet, Take 400 mg by mouth daily., Disp: , Rfl:    metoprolol  tartrate (LOPRESSOR ) 25 MG tablet, Take 1 tablet (25 mg total) by mouth 2 (two) times daily., Disp: 180 tablet, Rfl: 3   Multiple Vitamin (MULTIVITAMIN ADULT PO), Take 1 tablet by mouth daily., Disp: , Rfl:    Omega-3 Fatty Acids (FISH OIL) 1000 MG CAPS, Take 1,000 mg by mouth daily., Disp: , Rfl:   Past Medical History: Past Medical History:  Diagnosis Date   Coronary artery calcification of native artery    Diabetes mellitus without complication (HCC)    Essential tremor     Hyperlipidemia    Nonobstructive atherosclerosis of coronary artery    Pneumonia    as a child    Tobacco Use: Social History   Tobacco Use  Smoking Status Every Day   Current packs/day: 0.50   Average packs/day: 0.5 packs/day for 40.8 years (20.4 ttl pk-yrs)   Types: Cigarettes   Start date: 83   Last attempt to quit: 2018  Smokeless Tobacco Never    Labs: Review Flowsheet  More data exists      Latest Ref Rng & Units 07/23/2022 09/05/2022 01/28/2024 01/29/2024 01/30/2024  Labs for ITP Cardiac and Pulmonary Rehab  Cholestrol 0 - 200 mg/dL 797  868  - - 62   LDL (calc) 0 - 99 mg/dL 848  74  - - 24   Direct LDL 0 - 99 mg/dL 847  72  - - -  HDL-C >59 mg/dL 35  37  - - 29   Trlycerides <150 mg/dL 86  891  - - 45   Hemoglobin A1c 4.8 - 5.6 % - - 5.5  - -  PH, Arterial 7.35 - 7.45 - - - 7.289  7.379  -  PCO2 arterial 32 - 48 mmHg - - - 40.0  30.1  -  Bicarbonate 20.0 - 28.0 mmol/L - - - 19.0  18.3  -  TCO2 22 - 32 mmol/L - - -  20  19  21  20  24   -  Acid-base deficit 0.0 - 2.0 mmol/L - - - 7.0  7.0  -  O2 Saturation % - - - 91  97  -    Details       Multiple values from one day are sorted in reverse-chronological order         Capillary Blood Glucose: Lab Results  Component Value Date   GLUCAP 83 05/03/2024   GLUCAP 108 (H) 05/03/2024   GLUCAP 99 04/30/2024   GLUCAP 117 (H) 04/30/2024   GLUCAP 140 (H) 02/02/2024     Exercise Target Goals: Exercise Program Goal: Individual exercise prescription set using results from initial 6 min walk test and THRR while considering  patient's activity barriers and safety.   Exercise Prescription Goal: Initial exercise prescription builds to 30-45 minutes a day of aerobic activity, 2-3 days per week.  Home exercise guidelines will be given to patient during program as part of exercise prescription that the participant will acknowledge.  Activity Barriers & Risk Stratification:  Activity Barriers & Cardiac Risk Stratification  - 04/21/24 1137       Activity Barriers & Cardiac Risk Stratification   Activity Barriers Right Hip Replacement;Incisional Pain    Cardiac Risk Stratification High   <5 METs on         6 Minute Walk:  6 Minute Walk     Row Name 04/21/24 1322         6 Minute Walk   Phase Initial     Distance 1500 feet     Walk Time 6 minutes     # of Rest Breaks 0     MPH 2.84     METS 4.1     RPE 11     Perceived Dyspnea  0     VO2 Peak 14.27     Symptoms No     Resting HR 73 bpm     Resting BP 102/68     Resting Oxygen Saturation  97 %     Exercise Oxygen Saturation  during 6 min walk 97 %     Max Ex. HR 90 bpm     Max Ex. BP 130/70     2 Minute Post BP 112/70        Oxygen Initial Assessment:   Oxygen Re-Evaluation:   Oxygen Discharge (Final Oxygen Re-Evaluation):   Initial Exercise Prescription:  Initial Exercise Prescription - 04/21/24 1100       Date of Initial Exercise RX and Referring Provider   Date 04/21/24    Referring Provider Peter Jordan, MD    Expected Discharge Date 07/16/24      Recumbant Bike   Level 2    RPM 50    Watts 30    Minutes 15    METs 2.5      NuStep   Level 2    SPM 75    Minutes 15    METs 2.5      Prescription Details   Frequency (times per week) 3    Duration Progress to 30 minutes of continuous aerobic without signs/symptoms of physical distress      Intensity   THRR 40-80% of Max Heartrate 66-132    Ratings of Perceived Exertion 11-13    Perceived Dyspnea 0-4      Progression   Progression Continue progressive overload as per policy without signs/symptoms or physical distress.      Resistance Training  Training Prescription Yes    Weight 3    Reps 10-15          Perform Capillary Blood Glucose checks as needed.  Exercise Prescription Changes:   Exercise Prescription Changes     Row Name 04/30/24 1100 05/12/24 1030           Response to Exercise   Blood Pressure (Admit) 102/64 108/72       Blood Pressure (Exercise) 116/76 120/76      Blood Pressure (Exit) 106/70 116/66      Heart Rate (Admit) 62 bpm 84 bpm      Heart Rate (Exercise) 90 bpm 98 bpm      Heart Rate (Exit) 69 bpm 80 bpm      Rating of Perceived Exertion (Exercise) 10 14      Symptoms None None      Comments Pt's first day in the CRP2 program Reviewed METs      Duration Continue with 30 min of aerobic exercise without signs/symptoms of physical distress. Continue with 30 min of aerobic exercise without signs/symptoms of physical distress.      Intensity THRR unchanged THRR unchanged        Progression   Progression Continue to progress workloads to maintain intensity without signs/symptoms of physical distress. Continue to progress workloads to maintain intensity without signs/symptoms of physical distress.      Average METs 2.2 3        Resistance Training   Training Prescription Yes No      Weight 3 No wts on wednesdays      Reps 10-15 --      Time 5 Minutes --        Interval Training   Interval Training No No        Recumbant Bike   Level 2 2      RPM 59 85      Watts 20 28      Minutes 15 15      METs 2.3 3.2        NuStep   Level 2 2      SPM 77 107      Minutes 15 15      METs 2.1 2.8         Exercise Comments:   Exercise Comments     Row Name 04/30/24 1121 05/12/24 1030         Exercise Comments Pt's first day in the CRP2 program. Pt exercised without compliants and tolerated session well. Reviewed METs, pt is making progress. RPE's are currently at 12-14 on modalites, no increases at this time.         Exercise Goals and Review:   Exercise Goals     Row Name 04/21/24 1138             Exercise Goals   Increase Physical Activity Yes       Intervention Provide advice, education, support and counseling about physical activity/exercise needs.;Develop an individualized exercise prescription for aerobic and resistive training based on initial evaluation findings, risk  stratification, comorbidities and participant's personal goals.       Expected Outcomes Short Term: Attend rehab on a regular basis to increase amount of physical activity.;Long Term: Exercising regularly at least 3-5 days a week.;Long Term: Add in home exercise to make exercise part of routine and to increase amount of physical activity.       Increase Strength and Stamina Yes  Intervention Develop an individualized exercise prescription for aerobic and resistive training based on initial evaluation findings, risk stratification, comorbidities and participant's personal goals.;Provide advice, education, support and counseling about physical activity/exercise needs.       Expected Outcomes Long Term: Improve cardiorespiratory fitness, muscular endurance and strength as measured by increased METs and functional capacity ( );Short Term: Perform resistance training exercises routinely during rehab and add in resistance training at home;Short Term: Increase workloads from initial exercise prescription for resistance, speed, and METs.       Able to understand and use rate of perceived exertion (RPE) scale Yes       Intervention Provide education and explanation on how to use RPE scale       Expected Outcomes Long Term:  Able to use RPE to guide intensity level when exercising independently;Short Term: Able to use RPE daily in rehab to express subjective intensity level       Knowledge and understanding of Target Heart Rate Range (THRR) Yes       Intervention Provide education and explanation of THRR including how the numbers were predicted and where they are located for reference       Expected Outcomes Long Term: Able to use THRR to govern intensity when exercising independently;Short Term: Able to state/look up THRR;Short Term: Able to use daily as guideline for intensity in rehab       Understanding of Exercise Prescription Yes       Intervention Provide education, explanation, and written  materials on patient's individual exercise prescription       Expected Outcomes Short Term: Able to explain program exercise prescription;Long Term: Able to explain home exercise prescription to exercise independently          Exercise Goals Re-Evaluation :  Exercise Goals Re-Evaluation     Row Name 04/30/24 1120             Exercise Goal Re-Evaluation   Exercise Goals Review Increase Physical Activity;Understanding of Exercise Prescription;Increase Strength and Stamina;Knowledge and understanding of Target Heart Rate Range (THRR);Able to understand and use rate of perceived exertion (RPE) scale       Comments Pt's first day in the CRP2 program. Pt understands the exercise Rx, RPE sclae and THRR.       Expected Outcomes Will continue to monitor the patient and progress exercise workloads as tolerated.          Discharge Exercise Prescription (Final Exercise Prescription Changes):  Exercise Prescription Changes - 05/12/24 1030       Response to Exercise   Blood Pressure (Admit) 108/72    Blood Pressure (Exercise) 120/76    Blood Pressure (Exit) 116/66    Heart Rate (Admit) 84 bpm    Heart Rate (Exercise) 98 bpm    Heart Rate (Exit) 80 bpm    Rating of Perceived Exertion (Exercise) 14    Symptoms None    Comments Reviewed METs    Duration Continue with 30 min of aerobic exercise without signs/symptoms of physical distress.    Intensity THRR unchanged      Progression   Progression Continue to progress workloads to maintain intensity without signs/symptoms of physical distress.    Average METs 3      Resistance Training   Training Prescription No    Weight No wts on wednesdays      Interval Training   Interval Training No      Recumbant Bike   Level 2    RPM 85  Watts 28    Minutes 15    METs 3.2      NuStep   Level 2    SPM 107    Minutes 15    METs 2.8          Nutrition:  Target Goals: Understanding of nutrition guidelines, daily intake of sodium  1500mg , cholesterol 200mg , calories 30% from fat and 7% or less from saturated fats, daily to have 5 or more servings of fruits and vegetables.  Biometrics:  Pre Biometrics - 04/21/24 1132       Pre Biometrics   Waist Circumference 39.25 inches    Hip Circumference 41 inches    Waist to Hip Ratio 0.96 %    Triceps Skinfold 17 mm    % Body Fat 26.4 %    Grip Strength 33 kg    Flexibility 14 in    Single Leg Stand 30 seconds           Nutrition Therapy Plan and Nutrition Goals:   Nutrition Assessments:  MEDIFICTS Score Key: >=70 Need to make dietary changes  40-70 Heart Healthy Diet <= 40 Therapeutic Level Cholesterol Diet   Flowsheet Row INTENSIVE CARDIAC REHAB from 05/05/2024 in [facility], [facility]  Picture Your Plate Total Score on Admission 70   Picture Your Plate Scores: <59 Unhealthy dietary pattern with much room for improvement. 41-50 Dietary pattern unlikely to meet recommendations for good health and room for improvement. 51-60 More healthful dietary pattern, with some room for improvement.  >60 Healthy dietary pattern, although there may be some specific behaviors that could be improved.    Nutrition Goals Re-Evaluation:   Nutrition Goals Re-Evaluation:   Nutrition Goals Discharge (Final Nutrition Goals Re-Evaluation):   Psychosocial: Target Goals: Acknowledge presence or absence of significant depression and/or stress, maximize coping skills, provide positive support system. Participant is able to verbalize types and ability to use techniques and skills needed for reducing stress and depression.  Initial Review & Psychosocial Screening:  Initial Psych Review & Screening - 04/21/24 1138       Initial Review   Current issues with None Identified      Family Dynamics   Good Support System? Yes   wife     Barriers   Psychosocial barriers to participate in program There are no identifiable  barriers or psychosocial needs.      Screening Interventions   Interventions Encouraged to exercise;Provide feedback about the scores to participant    Expected Outcomes Long Term goal: The participant improves quality of Life and PHQ9 Scores as seen by post scores and/or verbalization of changes;Short Term goal: Identification and review with participant of any Quality of Life or Depression concerns found by scoring the questionnaire.          Quality of Life Scores:  Quality of Life - 04/21/24 1323       Quality of Life   Select Quality of Life      Quality of Life Scores   Health/Function Pre 20.63 %    Socioeconomic Pre 26.21 %    Psych/Spiritual Pre 24.5 %    Family Pre 26 %    GLOBAL Pre 23.2 %         Scores of 19 and below usually indicate a poorer quality of life in these areas.  A difference of  2-3 points is a clinically meaningful difference.  A difference of 2-3 points in the total score  of the Quality of Life Index has been associated with significant improvement in overall quality of life, self-image, physical symptoms, and general health in studies assessing change in quality of life.  PHQ-9: Review Flowsheet       04/21/2024  Depression screen PHQ 2/9  Decreased Interest 0  Down, Depressed, Hopeless 0  PHQ - 2 Score 0  Altered sleeping 0  Tired, decreased energy 1  Change in appetite 0  Feeling bad or failure about yourself  0  Trouble concentrating 0  Moving slowly or fidgety/restless 1  Suicidal thoughts 0  PHQ-9 Score 2  Difficult doing work/chores Not difficult at all   Interpretation of Total Score  Total Score Depression Severity:  1-4 = Minimal depression, 5-9 = Mild depression, 10-14 = Moderate depression, 15-19 = Moderately severe depression, 20-27 = Severe depression   Psychosocial Evaluation and Intervention:   Psychosocial Re-Evaluation:  Psychosocial Re-Evaluation     Row Name 05/05/24 [phone]             Psychosocial  Re-Evaluation   Current issues with None Identified       Comments Juda says he has health insurance now and hopes to return to work at the end of the year. 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       Continue Psychosocial Services  No Follow up required          Psychosocial Discharge (Final Psychosocial Re-Evaluation):  Psychosocial Re-Evaluation - 05/05/24 0839       Psychosocial Re-Evaluation   Current issues with None Identified    Comments Daray says he has health insurance now and hopes to return to work at the end of the year. 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    Continue Psychosocial Services  No Follow up required          Vocational Rehabilitation: Provide vocational rehab assistance to qualifying candidates.   Vocational Rehab Evaluation & Intervention:  Vocational Rehab - 04/21/24 1138       Initial Vocational Rehab Evaluation & Intervention   Assessment shows need for Vocational Rehabilitation No   returning to work 12/25         Education: Education Goals: Education classes will be provided on a weekly basis, covering required topics. Participant will state understanding/return demonstration of topics presented.    Education     Row Name 04/28/24 0800     Education   Cardiac Education Topics Pritikin   Secondary School Teacher School   Educator Nurse;Respiratory Therapist   Weekly Topic Tasty Appetizers and Snacks   Instruction Review Code 1- Verbalizes Understanding   Class Start Time 0815   Class Stop Time 0846   Class Time Calculation (min) 31 min    Row Name 04/30/24 0800     Education   Cardiac Education Topics Pritikin   [employer] Workshops     Workshops   Educator Exercise Physiologist   Select Exercise   Exercise Workshop Managing Heart Disease: Your Path to a Healthier Heart   Instruction Review Code 1- Verbalizes Understanding   Class Start Time [phone]   Class Stop Time 0900   Class Time Calculation  (min) 41 min    Row Name 05/03/24 0700     Education   Cardiac Education Topics Pritikin   Nurse, Children's Exercise Physiologist   Select Psychosocial   Psychosocial Healthy Minds, Bodies, Hearts  Instruction Review Code 1- Verbalizes Understanding   Class Start Time 0815   Class Stop Time 0847   Class Time Calculation (min) 32 min    Row Name 05/05/24 0800     Education   Cardiac Education Topics Pritikin   Secondary School Teacher School   Educator Nurse;Respiratory Therapist   Weekly Topic Adding Flavor - Sodium-Free   Instruction Review Code 1- Verbalizes Understanding   Class Start Time 0815   Class Stop Time 0847   Class Time Calculation (min) 32 min    Row Name 05/07/24 0800     Education   Cardiac Education Topics Pritikin   [employer]     Workshops   Educator Dietitian   Select Nutrition   Nutrition Workshop Label Reading   Instruction Review Code 1- Verbalizes Understanding   Class Start Time 0815   Class Stop Time [phone]   Class Time Calculation (min) 37 min    Row Name 05/10/24 0900     Education   Cardiac Education Topics Pritikin   [employer] Workshops     Workshops   Educator Exercise Physiologist   Select Exercise   Exercise Workshop Location Manager and Fall Prevention   Instruction Review Code 1- Verbalizes Understanding   Class Start Time 0815   Class Stop Time 0850   Class Time Calculation (min) 35 min    Row Name 05/12/24 0800     Education   Cardiac Education Topics Pritikin   Secondary School Teacher School   Educator Nurse;Respiratory Therapist   Weekly Topic Fast and Healthy Breakfasts   Instruction Review Code 1- Verbalizes Understanding   Class Start Time 0815   Class Stop Time 9147   Class Time Calculation (min) 37 min    Row Name 05/14/24 0800     Education   Cardiac Education Topics Pritikin   Select Core Videos     Core Videos   Educator Dietitian   Select  Nutrition   Nutrition Other  label reading   Instruction Review Code 1- Verbalizes Understanding   Class Start Time 0815   Class Stop Time 0847   Class Time Calculation (min) 32 min      Core Videos: Exercise    Move It!  Clinical staff conducted group or individual video education with verbal and written material and guidebook.  Patient learns the recommended Pritikin exercise program. Exercise with the goal of living a long, healthy life. Some of the health benefits of exercise include controlled diabetes, healthier blood pressure levels, improved cholesterol levels, improved heart and lung capacity, improved sleep, and better body composition. Everyone should speak with their doctor before starting or changing an exercise routine.  Biomechanical Limitations Clinical staff conducted group or individual video education with verbal and written material and guidebook.  Patient learns how biomechanical limitations can impact exercise and how we can mitigate and possibly overcome limitations to have an impactful and balanced exercise routine.  Body Composition Clinical staff conducted group or individual video education with verbal and written material and guidebook.  Patient learns that body composition (ratio of muscle mass to fat mass) is a key component to assessing overall fitness, rather than body weight alone. Increased fat mass, especially visceral belly fat, can put us  at increased risk for metabolic syndrome, type 2 diabetes, heart disease, and even death. It is recommended to combine diet and exercise (cardiovascular and resistance training) to improve your body composition. Seek guidance  from your physician and exercise physiologist before implementing an exercise routine.  Exercise Action Plan Clinical staff conducted group or individual video education with verbal and written material and guidebook.  Patient learns the recommended strategies to achieve and enjoy long-term exercise  adherence, including variety, self-motivation, self-efficacy, and positive decision making. Benefits of exercise include fitness, good health, weight management, more energy, better sleep, less stress, and overall well-being.  Medical   Heart Disease Risk Reduction Clinical staff conducted group or individual video education with verbal and written material and guidebook.  Patient learns our heart is our most vital organ as it circulates oxygen, nutrients, white blood cells, and hormones throughout the entire body, and carries waste away. Data supports a plant-based eating plan like the Pritikin Program for its effectiveness in slowing progression of and reversing heart disease. The video provides a number of recommendations to address heart disease.   Metabolic Syndrome and Belly Fat  Clinical staff conducted group or individual video education with verbal and written material and guidebook.  Patient learns what metabolic syndrome is, how it leads to heart disease, and how one can reverse it and keep it from coming back. You have metabolic syndrome if you have 3 of the following 5 criteria: abdominal obesity, high blood pressure, high triglycerides, low HDL cholesterol, and high blood sugar.  Hypertension and Heart Disease Clinical staff conducted group or individual video education with verbal and written material and guidebook.  Patient learns that high blood pressure, or hypertension, is very common in the [place] . Hypertension is largely due to excessive salt intake, but other important risk factors include being overweight, physical inactivity, drinking too much alcohol, smoking, and not eating enough potassium from fruits and vegetables. High blood pressure is a leading risk factor for heart attack, stroke, congestive heart failure, dementia, kidney failure, and premature death. Long-term effects of excessive salt intake include stiffening of the arteries and thickening of heart muscle and  organ damage. Recommendations include ways to reduce hypertension and the risk of heart disease.  Diseases of Our Time - Focusing on Diabetes Clinical staff conducted group or individual video education with verbal and written material and guidebook.  Patient learns why the best way to stop diseases of our time is prevention, through food and other lifestyle changes. Medicine (such as prescription pills and surgeries) is often only a Band-Aid on the problem, not a long-term solution. Most common diseases of our time include obesity, type 2 diabetes, hypertension, heart disease, and cancer. The Pritikin Program is recommended and has been proven to help reduce, reverse, and/or prevent the damaging effects of metabolic syndrome.  Nutrition   Overview of the Pritikin Eating Plan  Clinical staff conducted group or individual video education with verbal and written material and guidebook.  Patient learns about the Pritikin Eating Plan for disease risk reduction. The Pritikin Eating Plan emphasizes a wide variety of unrefined, minimally-processed carbohydrates, like fruits, vegetables, whole grains, and legumes. Go, Caution, and Stop food choices are explained. Plant-based and lean animal proteins are emphasized. Rationale provided for low sodium intake for blood pressure control, low added sugars for blood sugar stabilization, and low added fats and oils for coronary artery disease risk reduction and weight management.  Calorie Density  Clinical staff conducted group or individual video education with verbal and written material and guidebook.  Patient learns about calorie density and how it impacts the Pritikin Eating Plan. Knowing the characteristics of the food you choose will help you decide whether  those foods will lead to weight gain or weight loss, and whether you want to consume more or less of them. Weight loss is usually a side effect of the Pritikin Eating Plan because of its focus on low  calorie-dense foods.  Label Reading  Clinical staff conducted group or individual video education with verbal and written material and guidebook.  Patient learns about the Pritikin recommended label reading guidelines and corresponding recommendations regarding calorie density, added sugars, sodium content, and whole grains.  Dining Out - Part 1  Clinical staff conducted group or individual video education with verbal and written material and guidebook.  Patient learns that restaurant meals can be sabotaging because they can be so high in calories, fat, sodium, and/or sugar. Patient learns recommended strategies on how to positively address this and avoid unhealthy pitfalls.  Facts on Fats  Clinical staff conducted group or individual video education with verbal and written material and guidebook.  Patient learns that lifestyle modifications can be just as effective, if not more so, as many medications for lowering your risk of heart disease. A Pritikin lifestyle can help to reduce your risk of inflammation and atherosclerosis (cholesterol build-up, or plaque, in the artery walls). Lifestyle interventions such as dietary choices and physical activity address the cause of atherosclerosis. A review of the types of fats and their impact on blood cholesterol levels, along with dietary recommendations to reduce fat intake is also included.  Nutrition Action Plan  Clinical staff conducted group or individual video education with verbal and written material and guidebook.  Patient learns how to incorporate Pritikin recommendations into their lifestyle. Recommendations include planning and keeping personal health goals in mind as an important part of their success.  Healthy Mind-Set    Healthy Minds, Bodies, Hearts  Clinical staff conducted group or individual video education with verbal and written material and guidebook.  Patient learns how to identify when they are stressed. Video will discuss the  impact of that stress, as well as the many benefits of stress management. Patient will also be introduced to stress management techniques. The way we think, act, and feel has an impact on our hearts.  How Our Thoughts Can Heal Our Hearts  Clinical staff conducted group or individual video education with verbal and written material and guidebook.  Patient learns that negative thoughts can cause depression and anxiety. This can result in negative lifestyle behavior and serious health problems. Cognitive behavioral therapy is an effective method to help control our thoughts in order to change and improve our emotional outlook.  Additional Videos:  Exercise    Improving Performance  Clinical staff conducted group or individual video education with verbal and written material and guidebook.  Patient learns to use a non-linear approach by alternating intensity levels and lengths of time spent exercising to help burn more calories and lose more body fat. Cardiovascular exercise helps improve heart health, metabolism, hormonal balance, blood sugar control, and recovery from fatigue. Resistance training improves strength, endurance, balance, coordination, reaction time, metabolism, and muscle mass. Flexibility exercise improves circulation, posture, and balance. Seek guidance from your physician and exercise physiologist before implementing an exercise routine and learn your capabilities and proper form for all exercise.  Introduction to Yoga  Clinical staff conducted group or individual video education with verbal and written material and guidebook.  Patient learns about yoga, a discipline of the coming together of mind, breath, and body. The benefits of yoga include improved flexibility, improved range of motion, better posture and  core strength, increased lung function, weight loss, and positive self-image. Yoga's heart health benefits include lowered blood pressure, healthier heart rate, decreased  cholesterol and triglyceride levels, improved immune function, and reduced stress. Seek guidance from your physician and exercise physiologist before implementing an exercise routine and learn your capabilities and proper form for all exercise.  Medical   Aging: Enhancing Your Quality of Life  Clinical staff conducted group or individual video education with verbal and written material and guidebook.  Patient learns key strategies and recommendations to stay in good physical health and enhance quality of life, such as prevention strategies, having an advocate, securing a Health Care Proxy and Power of Attorney, and keeping a list of medications and system for tracking them. It also discusses how to avoid risk for bone loss.  Biology of Weight Control  Clinical staff conducted group or individual video education with verbal and written material and guidebook.  Patient learns that weight gain occurs because we consume more calories than we burn (eating more, moving less). Even if your body weight is normal, you may have higher ratios of fat compared to muscle mass. Too much body fat puts you at increased risk for cardiovascular disease, heart attack, stroke, type 2 diabetes, and obesity-related cancers. In addition to exercise, following the Pritikin Eating Plan can help reduce your risk.  Decoding Lab Results  Clinical staff conducted group or individual video education with verbal and written material and guidebook.  Patient learns that lab test reflects one measurement whose values change over time and are influenced by many factors, including medication, stress, sleep, exercise, food, hydration, pre-existing medical conditions, and more. It is recommended to use the knowledge from this video to become more involved with your lab results and evaluate your numbers to speak with your doctor.   Diseases of Our Time - Overview  Clinical staff conducted group or individual video education with verbal  and written material and guidebook.  Patient learns that according to the CDC, 50% to 70% of chronic diseases (such as obesity, type 2 diabetes, elevated lipids, hypertension, and heart disease) are avoidable through lifestyle improvements including healthier food choices, listening to satiety cues, and increased physical activity.  Sleep Disorders Clinical staff conducted group or individual video education with verbal and written material and guidebook.  Patient learns how good quality and duration of sleep are important to overall health and well-being. Patient also learns about sleep disorders and how they impact health along with recommendations to address them, including discussing with a physician.  Nutrition  Dining Out - Part 2 Clinical staff conducted group or individual video education with verbal and written material and guidebook.  Patient learns how to plan ahead and communicate in order to maximize their dining experience in a healthy and nutritious manner. Included are recommended food choices based on the type of restaurant the patient is visiting.   Fueling a Banker conducted group or individual video education with verbal and written material and guidebook.  There is a strong connection between our food choices and our health. Diseases like obesity and type 2 diabetes are very prevalent and are in large-part due to lifestyle choices. The Pritikin Eating Plan provides plenty of food and hunger-curbing satisfaction. It is easy to follow, affordable, and helps reduce health risks.  Menu Workshop  Clinical staff conducted group or individual video education with verbal and written material and guidebook.  Patient learns that restaurant meals can sabotage health goals because they  are often packed with calories, fat, sodium, and sugar. Recommendations include strategies to plan ahead and to communicate with the manager, chef, or server to help order a healthier  meal.  Planning Your Eating Strategy  Clinical staff conducted group or individual video education with verbal and written material and guidebook.  Patient learns about the Pritikin Eating Plan and its benefit of reducing the risk of disease. The Pritikin Eating Plan does not focus on calories. Instead, it emphasizes high-quality, nutrient-rich foods. By knowing the characteristics of the foods, we choose, we can determine their calorie density and make informed decisions.  Targeting Your Nutrition Priorities  Clinical staff conducted group or individual video education with verbal and written material and guidebook.  Patient learns that lifestyle habits have a tremendous impact on disease risk and progression. This video provides eating and physical activity recommendations based on your personal health goals, such as reducing LDL cholesterol, losing weight, preventing or controlling type 2 diabetes, and reducing high blood pressure.  Vitamins and Minerals  Clinical staff conducted group or individual video education with verbal and written material and guidebook.  Patient learns different ways to obtain key vitamins and minerals, including through a recommended healthy diet. It is important to discuss all supplements you take with your doctor.   Healthy Mind-Set    Smoking Cessation  Clinical staff conducted group or individual video education with verbal and written material and guidebook.  Patient learns that cigarette smoking and tobacco addiction pose a serious health risk which affects millions of people. Stopping smoking will significantly reduce the risk of heart disease, lung disease, and many forms of cancer. Recommended strategies for quitting are covered, including working with your doctor to develop a successful plan.  Culinary   Becoming a Set Designer conducted group or individual video education with verbal and written material and guidebook.  Patient learns  that cooking at home can be healthy, cost-effective, quick, and puts them in control. Keys to cooking healthy recipes will include looking at your recipe, assessing your equipment needs, planning ahead, making it simple, choosing cost-effective seasonal ingredients, and limiting the use of added fats, salts, and sugars.  Cooking - Breakfast and Snacks  Clinical staff conducted group or individual video education with verbal and written material and guidebook.  Patient learns how important breakfast is to satiety and nutrition through the entire day. Recommendations include key foods to eat during breakfast to help stabilize blood sugar levels and to prevent overeating at meals later in the day. Planning ahead is also a key component.  Cooking - Educational Psychologist conducted group or individual video education with verbal and written material and guidebook.  Patient learns eating strategies to improve overall health, including an approach to cook more at home. Recommendations include thinking of animal protein as a side on your plate rather than center stage and focusing instead on lower calorie dense options like vegetables, fruits, whole grains, and plant-based proteins, such as beans. Making sauces in large quantities to freeze for later and leaving the skin on your vegetables are also recommended to maximize your experience.  Cooking - Healthy Salads and Dressing Clinical staff conducted group or individual video education with verbal and written material and guidebook.  Patient learns that vegetables, fruits, whole grains, and legumes are the foundations of the Pritikin Eating Plan. Recommendations include how to incorporate each of these in flavorful and healthy salads, and how to create homemade salad dressings. Proper handling  of ingredients is also covered. Cooking - Soups and [employer] - Soups and Desserts Clinical staff conducted group or individual video education with  verbal and written material and guidebook.  Patient learns that Pritikin soups and desserts make for easy, nutritious, and delicious snacks and meal components that are low in sodium, fat, sugar, and calorie density, while high in vitamins, minerals, and filling fiber. Recommendations include simple and healthy ideas for soups and desserts.   Overview     The Pritikin Solution Program Overview Clinical staff conducted group or individual video education with verbal and written material and guidebook.  Patient learns that the results of the Pritikin Program have been documented in more than 100 articles published in peer-reviewed journals, and the benefits include reducing risk factors for (and, in some cases, even reversing) high cholesterol, high blood pressure, type 2 diabetes, obesity, and more! An overview of the three key pillars of the Pritikin Program will be covered: eating well, doing regular exercise, and having a healthy mind-set.  WORKSHOPS  Exercise: Exercise Basics: Building Your Action Plan Clinical staff led group instruction and group discussion with PowerPoint presentation and patient guidebook. To enhance the learning environment the use of posters, models and videos may be added. At the conclusion of this workshop, patients will comprehend the difference between physical activity and exercise, as well as the benefits of incorporating both, into their routine. Patients will understand the FITT (Frequency, Intensity, Time, and Type) principle and how to use it to build an exercise action plan. In addition, safety concerns and other considerations for exercise and cardiac rehab will be addressed by the presenter. The purpose of this lesson is to promote a comprehensive and effective weekly exercise routine in order to improve patients' overall level of fitness.   Managing Heart Disease: Your Path to a Healthier Heart Clinical staff led group instruction and group discussion with  PowerPoint presentation and patient guidebook. To enhance the learning environment the use of posters, models and videos may be added.At the conclusion of this workshop, patients will understand the anatomy and physiology of the heart. Additionally, they will understand how Pritikin's three pillars impact the risk factors, the progression, and the management of heart disease.  The purpose of this lesson is to provide a high-level overview of the heart, heart disease, and how the Pritikin lifestyle positively impacts risk factors.  Exercise Biomechanics Clinical staff led group instruction and group discussion with PowerPoint presentation and patient guidebook. To enhance the learning environment the use of posters, models and videos may be added. Patients will learn how the structural parts of their bodies function and how these functions impact their daily activities, movement, and exercise. Patients will learn how to promote a neutral spine, learn how to manage pain, and identify ways to improve their physical movement in order to promote healthy living. The purpose of this lesson is to expose patients to common physical limitations that impact physical activity. Participants will learn practical ways to adapt and manage aches and pains, and to minimize their effect on regular exercise. Patients will learn how to maintain good posture while sitting, walking, and lifting.  Balance Training and Fall Prevention  Clinical staff led group instruction and group discussion with PowerPoint presentation and patient guidebook. To enhance the learning environment the use of posters, models and videos may be added. At the conclusion of this workshop, patients will understand the importance of their sensorimotor skills (vision, proprioception, and the vestibular system) in maintaining their  ability to balance as they age. Patients will apply a variety of balancing exercises that are appropriate for their  current level of function. Patients will understand the common causes for poor balance, possible solutions to these problems, and ways to modify their physical environment in order to minimize their fall risk. The purpose of this lesson is to teach patients about the importance of maintaining balance as they age and ways to minimize their risk of falling.  WORKSHOPS   Nutrition:  Fueling a Ship Broker led group instruction and group discussion with PowerPoint presentation and patient guidebook. To enhance the learning environment the use of posters, models and videos may be added. Patients will review the foundational principles of the Pritikin Eating Plan and understand what constitutes a serving size in each of the food groups. Patients will also learn Pritikin-friendly foods that are better choices when away from home and review make-ahead meal and snack options. Calorie density will be reviewed and applied to three nutrition priorities: weight maintenance, weight loss, and weight gain. The purpose of this lesson is to reinforce (in a group setting) the key concepts around what patients are recommended to eat and how to apply these guidelines when away from home by planning and selecting Pritikin-friendly options. Patients will understand how calorie density may be adjusted for different weight management goals.  Mindful Eating  Clinical staff led group instruction and group discussion with PowerPoint presentation and patient guidebook. To enhance the learning environment the use of posters, models and videos may be added. Patients will briefly review the concepts of the Pritikin Eating Plan and the importance of low-calorie dense foods. The concept of mindful eating will be introduced as well as the importance of paying attention to internal hunger signals. Triggers for non-hunger eating and techniques for dealing with triggers will be explored. The purpose of this lesson is to provide  patients with the opportunity to review the basic principles of the Pritikin Eating Plan, discuss the value of eating mindfully and how to measure internal cues of hunger and fullness using the Hunger Scale. Patients will also discuss reasons for non-hunger eating and learn strategies to use for controlling emotional eating.  Targeting Your Nutrition Priorities Clinical staff led group instruction and group discussion with PowerPoint presentation and patient guidebook. To enhance the learning environment the use of posters, models and videos may be added. Patients will learn how to determine their genetic susceptibility to disease by reviewing their family history. Patients will gain insight into the importance of diet as part of an overall healthy lifestyle in mitigating the impact of genetics and other environmental insults. The purpose of this lesson is to provide patients with the opportunity to assess their personal nutrition priorities by looking at their family history, their own health history and current risk factors. Patients will also be able to discuss ways of prioritizing and modifying the Pritikin Eating Plan for their highest risk areas  Menu  Clinical staff led group instruction and group discussion with PowerPoint presentation and patient guidebook. To enhance the learning environment the use of posters, models and videos may be added. Using menus brought in from [employer], or printed from [employer], patients will apply the Pritikin dining out guidelines that were presented in the [employer] video. Patients will also be able to practice these guidelines in a variety of provided scenarios. The purpose of this lesson is to provide patients with the opportunity to practice hands-on learning of the  Pritikin [employer] guidelines with actual menus and practice scenarios.  Label Reading Clinical staff led group instruction and group discussion with PowerPoint  presentation and patient guidebook. To enhance the learning environment the use of posters, models and videos may be added. Patients will review and discuss the Pritikin label reading guidelines presented in Pritikin's Label Reading Educational series video. Using fool labels brought in from local grocery stores and markets, patients will apply the label reading guidelines and determine if the packaged food meet the Pritikin guidelines. The purpose of this lesson is to provide patients with the opportunity to review, discuss, and practice hands-on learning of the Pritikin Label Reading guidelines with actual packaged food labels. Cooking School  Pritikin's [employer] are designed to teach patients ways to prepare quick, simple, and affordable recipes at home. The importance of nutrition's role in chronic disease risk reduction is reflected in its emphasis in the overall Pritikin program. By learning how to prepare essential core Pritikin Eating Plan recipes, patients will increase control over what they eat; be able to customize the flavor of foods without the use of added salt, sugar, or fat; and improve the quality of the food they consume. By learning a set of core recipes which are easily assembled, quickly prepared, and affordable, patients are more likely to prepare more healthy foods at home. These workshops focus on convenient breakfasts, simple entres, side dishes, and desserts which can be prepared with minimal effort and are consistent with nutrition recommendations for cardiovascular risk reduction. Cooking [employer] are taught by a armed forces logistics/support/administrative officer (RD) who has been trained by the [employer]. The chef or RD has a clear understanding of the importance of minimizing - if not completely eliminating - added fat, sugar, and sodium in recipes. Throughout the series of Cooking School Workshop sessions, patients will learn about healthy ingredients and  efficient methods of cooking to build confidence in their capability to prepare    Cooking School weekly topics:  Adding Flavor- Sodium-Free  Fast and Healthy Breakfasts  Powerhouse Plant-Based Proteins  Satisfying Salads and Dressings  Simple Sides and Sauces  International Cuisine-Spotlight on the [employer] Zones  Delicious Desserts  Savory Soups  [employer] - Meals in a Astronomer Appetizers and Snacks  Comforting Weekend Breakfasts  One-Pot Wonders   Fast Evening Meals  Landscape Architect Your Pritikin Plate  WORKSHOPS   Healthy Mindset (Psychosocial):  Focused Goals, Sustainable Changes Clinical staff led group instruction and group discussion with PowerPoint presentation and patient guidebook. To enhance the learning environment the use of posters, models and videos may be added. Patients will be able to apply effective goal setting strategies to establish at least one personal goal, and then take consistent, meaningful action toward that goal. They will learn to identify common barriers to achieving personal goals and develop strategies to overcome them. Patients will also gain an understanding of how our mind-set can impact our ability to achieve goals and the importance of cultivating a positive and growth-oriented mind-set. The purpose of this lesson is to provide patients with a deeper understanding of how to set and achieve personal goals, as well as the tools and strategies needed to overcome common obstacles which may arise along the way.  From Head to Heart: The Power of a Healthy Outlook  Clinical staff led group instruction and group discussion with PowerPoint presentation and patient guidebook. To enhance the learning environment the use of posters, models and  videos may be added. Patients will be able to recognize and describe the impact of emotions and mood on physical health. They will discover the importance of self-care and explore self-care  practices which may work for them. Patients will also learn how to utilize the 4 C's to cultivate a healthier outlook and better manage stress and challenges. The purpose of this lesson is to demonstrate to patients how a healthy outlook is an essential part of maintaining good health, especially as they continue their cardiac rehab journey.  Healthy Sleep for a Healthy Heart Clinical staff led group instruction and group discussion with PowerPoint presentation and patient guidebook. To enhance the learning environment the use of posters, models and videos may be added. At the conclusion of this workshop, patients will be able to demonstrate knowledge of the importance of sleep to overall health, well-being, and quality of life. They will understand the symptoms of, and treatments for, common sleep disorders. Patients will also be able to identify daytime and nighttime behaviors which impact sleep, and they will be able to apply these tools to help manage sleep-related challenges. The purpose of this lesson is to provide patients with a general overview of sleep and outline the importance of quality sleep. Patients will learn about a few of the most common sleep disorders. Patients will also be introduced to the concept of "sleep hygiene," and discover ways to self-manage certain sleeping problems through simple daily behavior changes. Finally, the workshop will motivate patients by clarifying the links between quality sleep and their goals of heart-healthy living.   Recognizing and Reducing Stress Clinical staff led group instruction and group discussion with PowerPoint presentation and patient guidebook. To enhance the learning environment the use of posters, models and videos may be added. At the conclusion of this workshop, patients will be able to understand the types of stress reactions, differentiate between acute and chronic stress, and recognize the impact that chronic stress has on their health. They  will also be able to apply different coping mechanisms, such as reframing negative self-talk. Patients will have the opportunity to practice a variety of stress management techniques, such as deep abdominal breathing, progressive muscle relaxation, and/or guided imagery.  The purpose of this lesson is to educate patients on the role of stress in their lives and to provide healthy techniques for coping with it.  Learning Barriers/Preferences:  Learning Barriers/Preferences - 04/21/24 1138       Learning Barriers/Preferences   Learning Barriers Sight    Learning Preferences Computer/Internet;Group Instruction;Individual Instruction;Skilled Demonstration;Verbal Instruction;Written Material;Video;Pictoral          Education Topics:  Knowledge Questionnaire Score:  Knowledge Questionnaire Score - 04/21/24 1148       Knowledge Questionnaire Score   Pre Score 22/24          Core Components/Risk Factors/Patient Goals at Admission:  Personal Goals and Risk Factors at Admission - 04/21/24 1139       Core Components/Risk Factors/Patient Goals on Admission    Weight Management Yes;Weight Maintenance    Intervention Weight Management: Develop a combined nutrition and exercise program designed to reach desired caloric intake, while maintaining appropriate intake of nutrient and fiber, sodium and fats, and appropriate energy expenditure required for the weight goal.;Weight Management: Provide education and appropriate resources to help participant work on and attain dietary goals.    Expected Outcomes Short Term: Continue to assess and modify interventions until short term weight is achieved;Long Term: Adherence to nutrition and physical activity/exercise program aimed  toward attainment of established weight goal;Weight Maintenance: Understanding of the daily nutrition guidelines, which includes 25-35% calories from fat, 7% or less cal from saturated fats, less than 200mg  cholesterol, less than  1.5gm of sodium, & 5 or more servings of fruits and vegetables daily;Understanding recommendations for meals to include 15-35% energy as protein, 25-35% energy from fat, 35-60% energy from carbohydrates, less than 200mg  of dietary cholesterol, 20-35 gm of total fiber daily;Understanding of distribution of calorie intake throughout the day with the consumption of 4-5 meals/snacks    Tobacco Cessation Yes    Number of packs per day 5 cigarettes a day    Intervention Assist the participant in steps to quit. Provide individualized education and counseling about committing to Tobacco Cessation, relapse prevention, and pharmacological support that can be provided by physician.;Education officer, environmental, assist with locating and accessing local/national Quit Smoking programs, and support quit date choice.    Expected Outcomes Short Term: Will demonstrate readiness to quit, by selecting a quit date.;Long Term: Complete abstinence from all tobacco products for at least 12 months from quit date.;Short Term: Will quit all tobacco product use, adhering to prevention of relapse plan.    Diabetes Yes    Intervention Provide education about signs/symptoms and action to take for hypo/hyperglycemia.;Provide education about proper nutrition, including hydration, and aerobic/resistive exercise prescription along with prescribed medications to achieve blood glucose in normal ranges: Fasting glucose 65-99 mg/dL    Expected Outcomes Short Term: Participant verbalizes understanding of the signs/symptoms and immediate care of hyper/hypoglycemia, proper foot care and importance of medication, aerobic/resistive exercise and nutrition plan for blood glucose control.;Long Term: Attainment of HbA1C < 7%.    Hypertension Yes    Intervention Provide education on lifestyle modifcations including regular physical activity/exercise, weight management, moderate sodium restriction and increased consumption of fresh fruit, vegetables, and  low fat dairy, alcohol moderation, and smoking cessation.;Monitor prescription use compliance.    Expected Outcomes Long Term: Maintenance of blood pressure at goal levels.;Short Term: Continued assessment and intervention until BP is < 140/41mm HG in hypertensive participants. < 130/57mm HG in hypertensive participants with diabetes, heart failure or chronic kidney disease.    Lipids Yes    Intervention Provide education and support for participant on nutrition & aerobic/resistive exercise along with prescribed medications to achieve LDL 70mg , HDL >40mg .    Expected Outcomes Short Term: Participant states understanding of desired cholesterol values and is compliant with medications prescribed. Participant is following exercise prescription and nutrition guidelines.;Long Term: Cholesterol controlled with medications as prescribed, with individualized exercise RX and with personalized nutrition plan. Value goals: LDL < 70mg , HDL > 40 mg.          Core Components/Risk Factors/Patient Goals Review:   Goals and Risk Factor Review     Row Name 05/05/24 0841 05/11/24 1513           Core Components/Risk Factors/Patient Goals Review   Personal Goals Review Weight Management/Obesity;Hypertension;Lipids;Tobacco Cessation Weight Management/Obesity;Hypertension;Lipids;Tobacco Cessation      Review Ronnald started cardiac rehab on 05/03/24. Christine is off to a good start to exercise. Some systolic blood pressures noted in the 90's asymptomatic. Will continue to monitor BP's. Ollin started cardiac rehab on 05/03/24. Gionni is off to a good start to exercise. Some systolic blood pressures noted in the 90's asymptomatic. Will continue to monitor BP's. [name] PAC notifed about BP's.      Expected Outcomes Jarnell will continue to participate in cardiac rehab for exercise, nutriiton and lifestyle modifications  Locke will continue to participate in cardiac rehab for exercise, nutriiton and lifestyle  modifications         Core Components/Risk Factors/Patient Goals at Discharge (Final Review):   Goals and Risk Factor Review - 05/11/24 1513       Core Components/Risk Factors/Patient Goals Review   Personal Goals Review Weight Management/Obesity;Hypertension;Lipids;Tobacco Cessation    Review Donato started cardiac rehab on 05/03/24. Nikki is off to a good start to exercise. Some systolic blood pressures noted in the 90's asymptomatic. Will continue to monitor BP's. [name] PAC notifed about BP's.    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 Introduction to [facility]ikin education/intensive cardiac rehab. Initial orientation packet reviewed with patient. 30 Day ITP Review. Pearce started cardiac rehab on 04/30/24. Bilbo did well with exercise. 30 Day ITP Review. Mackinley started cardiac rehab on 04/30/24. Chrisopher is off to a good start with exercise.        Comments: See ITP comments.[name] Elpidio Quan RN BSN

## 2024-05-14 ENCOUNTER — Encounter (HOSPITAL_COMMUNITY)
Admission: RE | Admit: 2024-05-14 | Discharge: 2024-05-14 | Disposition: A | Discharge status: Home / Self Care | Source: Ambulatory Visit | Attending: Cardiology | Admitting: Cardiology

## 2024-05-14 DIAGNOSIS — Z951 Presence of aortocoronary bypass graft: Secondary | ICD-10-CM | POA: Diagnosis not present

## 2024-05-17 ENCOUNTER — Encounter (HOSPITAL_COMMUNITY)
Admission: RE | Admit: 2024-05-17 | Discharge: 2024-05-17 | Disposition: A | Discharge status: Home / Self Care | Source: Ambulatory Visit | Attending: Cardiology | Admitting: Cardiology

## 2024-05-17 DIAGNOSIS — Z951 Presence of aortocoronary bypass graft: Secondary | ICD-10-CM | POA: Insufficient documentation

## 2024-05-19 ENCOUNTER — Encounter (HOSPITAL_COMMUNITY)
Admission: RE | Admit: 2024-05-19 | Discharge: 2024-05-19 | Disposition: A | Source: Ambulatory Visit | Attending: Cardiology

## 2024-05-19 DIAGNOSIS — Z951 Presence of aortocoronary bypass graft: Secondary | ICD-10-CM | POA: Diagnosis not present

## 2024-05-20 ENCOUNTER — Ambulatory Visit (HOSPITAL_COMMUNITY)
Admission: RE | Admit: 2024-05-20 | Discharge: 2024-05-20 | Disposition: A | Discharge status: Home / Self Care | Source: Ambulatory Visit | Attending: Thoracic Surgery (Cardiothoracic Vascular Surgery) | Admitting: Thoracic Surgery (Cardiothoracic Vascular Surgery)

## 2024-05-20 DIAGNOSIS — Z951 Presence of aortocoronary bypass graft: Secondary | ICD-10-CM | POA: Diagnosis not present

## 2024-05-20 DIAGNOSIS — I2510 Atherosclerotic heart disease of native coronary artery without angina pectoris: Secondary | ICD-10-CM | POA: Diagnosis not present

## 2024-05-20 DIAGNOSIS — I371 Nonrheumatic pulmonary valve insufficiency: Secondary | ICD-10-CM | POA: Insufficient documentation

## 2024-05-20 DIAGNOSIS — I071 Rheumatic tricuspid insufficiency: Secondary | ICD-10-CM | POA: Insufficient documentation

## 2024-05-20 DIAGNOSIS — J90 Pleural effusion, not elsewhere classified: Secondary | ICD-10-CM | POA: Diagnosis not present

## 2024-05-21 ENCOUNTER — Other Ambulatory Visit: Payer: Self-pay

## 2024-05-21 ENCOUNTER — Encounter (HOSPITAL_COMMUNITY)
Admission: RE | Admit: 2024-05-21 | Discharge: 2024-05-21 | Disposition: A | Discharge status: Home / Self Care | Source: Ambulatory Visit | Attending: Cardiology | Admitting: Cardiology

## 2024-05-21 DIAGNOSIS — Z951 Presence of aortocoronary bypass graft: Secondary | ICD-10-CM | POA: Diagnosis not present

## 2024-05-21 LAB — ECHOCARDIOGRAM COMPLETE
AR max vel: 3.43 cm2
AV Area VTI: 3.06 cm2
AV Area mean vel: 2.96 cm2
AV Mean grad: 4 mmHg
AV Peak grad: 7 mmHg
Ao pk vel: 1.32 m/s
Area-P 1/2: 3.31 cm2
S' Lateral: 2.6 cm

## 2024-05-21 NOTE — Progress Notes (Signed)
-  Called and discussed echocardiogram results -Pericardial effusion has resolved at this time and ejection fraction 55-60% -He took colchicine as prescribed and is no longer taking  -His symptoms have resolved at this time -Please continue follow up with PCP and cardiology as scheduled -Follow up with [facility] as needed

## 2024-05-24 ENCOUNTER — Encounter (HOSPITAL_COMMUNITY)
Admission: RE | Admit: 2024-05-24 | Discharge: 2024-05-24 | Disposition: A | Source: Ambulatory Visit | Attending: Cardiology

## 2024-05-24 DIAGNOSIS — Z951 Presence of aortocoronary bypass graft: Secondary | ICD-10-CM

## 2024-05-26 ENCOUNTER — Encounter (HOSPITAL_COMMUNITY)

## 2024-05-28 ENCOUNTER — Encounter (HOSPITAL_COMMUNITY)
Admission: RE | Admit: 2024-05-28 | Discharge: 2024-05-28 | Disposition: A | Discharge status: Home / Self Care | Source: Ambulatory Visit | Attending: Cardiology | Admitting: Cardiology

## 2024-05-28 DIAGNOSIS — Z951 Presence of aortocoronary bypass graft: Secondary | ICD-10-CM | POA: Diagnosis not present

## 2024-05-28 NOTE — Progress Notes (Signed)
 Reviewed home exercise Rx with patient today.  Encouraged warm-up, cool-down, and stretching. Reviewed THRR of 66 - 132 and keeping RPE between 11-13. Encouraged to hydrate with activity.  Reviewed weather parameters for temperature and humidity for safe exercise outdoors. Reviewed S/S to terminate exercise and when to call 911 vs MD. Reviewed the use of NTG and pt was encouraged to carry at all times. Pt encouraged to always carry a cell phone for safety when exercising outdoors. Pt verbalized understanding of the home exercise Rx and was provided a copy.   Lorin Picket MS, ACSM-CEP, CCRP

## 2024-05-31 ENCOUNTER — Encounter (HOSPITAL_COMMUNITY)
Admission: RE | Admit: 2024-05-31 | Discharge: 2024-05-31 | Disposition: A | Source: Ambulatory Visit | Attending: Cardiology

## 2024-05-31 DIAGNOSIS — Z951 Presence of aortocoronary bypass graft: Secondary | ICD-10-CM | POA: Diagnosis not present

## 2024-06-02 ENCOUNTER — Encounter (HOSPITAL_COMMUNITY)
Admission: RE | Admit: 2024-06-02 | Discharge: 2024-06-02 | Disposition: A | Discharge status: Home / Self Care | Source: Ambulatory Visit | Attending: Cardiology | Admitting: Cardiology

## 2024-06-02 DIAGNOSIS — Z951 Presence of aortocoronary bypass graft: Secondary | ICD-10-CM

## 2024-06-04 ENCOUNTER — Encounter (HOSPITAL_COMMUNITY)
Admission: RE | Admit: 2024-06-04 | Discharge: 2024-06-04 | Disposition: A | Discharge status: Home / Self Care | Source: Ambulatory Visit | Attending: Cardiology | Admitting: Cardiology

## 2024-06-04 DIAGNOSIS — Z951 Presence of aortocoronary bypass graft: Secondary | ICD-10-CM | POA: Diagnosis not present

## 2024-06-07 ENCOUNTER — Telehealth: Payer: Self-pay | Admitting: Cardiology

## 2024-06-07 ENCOUNTER — Encounter (HOSPITAL_COMMUNITY)
Admission: RE | Admit: 2024-06-07 | Discharge: 2024-06-07 | Disposition: A | Discharge status: Home / Self Care | Source: Ambulatory Visit | Attending: Cardiology | Admitting: Cardiology

## 2024-06-07 DIAGNOSIS — Z951 Presence of aortocoronary bypass graft: Secondary | ICD-10-CM

## 2024-06-07 NOTE — Telephone Encounter (Signed)
 Patient would like to know when he can return to work.  He states he has a date of 12/20.  He needs a letter releasing him be able to returning to work.  He would like a return call.

## 2024-06-07 NOTE — Telephone Encounter (Signed)
 Spoke to patient he stated he already has a letter to return to work 12/20.He just wanted to make sure Dr.Jordan is ok with him returning to work then.Stated he feels good.Advised Dr.Jordan is out of office.Message has been sent to Dr.Jordan.

## 2024-06-09 ENCOUNTER — Encounter (HOSPITAL_COMMUNITY)
Admission: RE | Admit: 2024-06-09 | Discharge: 2024-06-09 | Disposition: A | Discharge status: Home / Self Care | Source: Ambulatory Visit | Attending: Cardiology | Admitting: Cardiology

## 2024-06-09 DIAGNOSIS — Z951 Presence of aortocoronary bypass graft: Secondary | ICD-10-CM | POA: Diagnosis not present

## 2024-06-09 NOTE — Telephone Encounter (Signed)
 Called patient spoke to wife Dr.Jordan advised may return to work.I already gave him a letter to return to work 07/03/24.Advised to call back if he needs another letter.

## 2024-06-11 ENCOUNTER — Encounter (HOSPITAL_COMMUNITY)

## 2024-06-14 ENCOUNTER — Encounter (HOSPITAL_COMMUNITY)
Admission: RE | Admit: 2024-06-14 | Discharge: 2024-06-14 | Disposition: A | Source: Ambulatory Visit | Attending: Cardiology

## 2024-06-14 DIAGNOSIS — Z951 Presence of aortocoronary bypass graft: Secondary | ICD-10-CM | POA: Diagnosis present

## 2024-06-15 NOTE — Progress Notes (Signed)
 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  Patient's Home Medications on Admission:  Current Outpatient Medications:    acetaminophen  (TYLENOL ) 325 MG tablet, Take 2 tablets (650 mg total) by mouth every 6 (six) hours as needed., Disp: , Rfl:    aspirin  EC 325 MG tablet, Take 1 tablet (325 mg total) by mouth daily., Disp: , Rfl:    atorvastatin  (LIPITOR) 40 MG tablet, Take 1 tablet (40 mg total) by mouth at bedtime., Disp: 90 tablet, Rfl: 3   colchicine  0.6 MG tablet, Take 1 tablet (0.6 mg total) by mouth 2 (two) times daily., Disp: 14 tablet, Rfl: 0   colchicine  0.6 MG tablet, Take 1 tablet (0.6 mg total) by mouth 2 (two) times daily., Disp: 60 tablet, Rfl: 2   ezetimibe  (ZETIA ) 10 MG tablet, Take 1 tablet (10 mg total) by mouth daily., Disp: 90 tablet, Rfl: 3   JARDIANCE  25 MG TABS tablet, Take 25 mg by mouth daily., Disp: , Rfl:    magnesium  oxide (MAG-OX) 400 MG tablet, Take 400 mg by mouth daily., Disp: , Rfl:    metoprolol  tartrate (LOPRESSOR ) 25 MG tablet, Take 1 tablet (25 mg total) by mouth 2 (two) times daily., Disp: 180 tablet, Rfl: 3   Multiple Vitamin (MULTIVITAMIN ADULT PO), Take 1 tablet by mouth daily., Disp: , Rfl:    Omega-3 Fatty Acids (FISH OIL) 1000 MG CAPS, Take 1,000 mg by mouth daily., Disp: , Rfl:   Past Medical History: Past Medical History:  Diagnosis Date   Coronary artery calcification of native artery    Diabetes mellitus without complication (HCC)    Essential tremor     Hyperlipidemia    Nonobstructive atherosclerosis of coronary artery    Pneumonia    as a child    Tobacco Use: Social History   Tobacco Use  Smoking Status Every Day   Current packs/day: 0.50   Average packs/day: 0.5 packs/day for 40.9 years (20.5 ttl pk-yrs)   Types: Cigarettes   Start date: 16   Last attempt to quit: 2018  Smokeless Tobacco Never    Labs: Review Flowsheet  More data exists      Latest Ref Rng & Units 07/23/2022 09/05/2022 01/28/2024 01/29/2024 01/30/2024  Labs for ITP Cardiac and Pulmonary Rehab  Cholestrol 0 - 200 mg/dL 797  868  - - 62   LDL (calc) 0 - 99 mg/dL 848  74  - - 24   Direct LDL 0 - 99 mg/dL 847  72  - - -  HDL-C >59 mg/dL 35  37  - - 29   Trlycerides <150 mg/dL 86  891  - - 45   Hemoglobin A1c 4.8 - 5.6 % - - 5.5  - -  PH, Arterial 7.35 - 7.45 - - - 7.289  7.379  -  PCO2 arterial 32 - 48 mmHg - - - 40.0  30.1  -  Bicarbonate 20.0 - 28.0 mmol/L - - - 19.0  18.3  -  TCO2 22 - 32 mmol/L - - -  20  19  21  20  24   -  Acid-base deficit 0.0 - 2.0 mmol/L - - - 7.0  7.0  -  O2 Saturation % - - - 91  97  -    Details       Multiple values from one day are sorted in reverse-chronological order         Capillary Blood Glucose: Lab Results  Component Value Date   GLUCAP 83 05/03/2024   GLUCAP 108 (H) 05/03/2024   GLUCAP 99 04/30/2024   GLUCAP 117 (H) 04/30/2024   GLUCAP 140 (H) 02/02/2024     Exercise Target Goals: Exercise Program Goal: Individual exercise prescription set using results from initial 6 min walk test and THRR while considering  patient's activity barriers and safety.   Exercise Prescription Goal: Initial exercise prescription builds to 30-45 minutes a day of aerobic activity, 2-3 days per week.  Home exercise guidelines will be given to patient during program as part of exercise prescription that the participant will acknowledge.  Activity Barriers & Risk Stratification:  Activity Barriers & Cardiac Risk Stratification  - 04/21/24 1137       Activity Barriers & Cardiac Risk Stratification   Activity Barriers Right Hip Replacement;Incisional Pain    Cardiac Risk Stratification High   <5 METs on         6 Minute Walk:  6 Minute Walk     Row Name 04/21/24 1322         6 Minute Walk   Phase Initial     Distance 1500 feet     Walk Time 6 minutes     # of Rest Breaks 0     MPH 2.84     METS 4.1     RPE 11     Perceived Dyspnea  0     VO2 Peak 14.27     Symptoms No     Resting HR 73 bpm     Resting BP 102/68     Resting Oxygen Saturation  97 %     Exercise Oxygen Saturation  during 6 min walk 97 %     Max Ex. HR 90 bpm     Max Ex. BP 130/70     2 Minute Post BP 112/70        Oxygen Initial Assessment:   Oxygen Re-Evaluation:   Oxygen Discharge (Final Oxygen Re-Evaluation):   Initial Exercise Prescription:  Initial Exercise Prescription - 04/21/24 1100       Date of Initial Exercise RX and Referring Provider   Date 04/21/24    Referring Provider Peter Jordan, MD    Expected Discharge Date 07/16/24      Recumbant Bike   Level 2    RPM 50    Watts 30    Minutes 15    METs 2.5      NuStep   Level 2    SPM 75    Minutes 15    METs 2.5      Prescription Details   Frequency (times per week) 3    Duration Progress to 30 minutes of continuous aerobic without signs/symptoms of physical distress      Intensity   THRR 40-80% of Max Heartrate 66-132    Ratings of Perceived Exertion 11-13    Perceived Dyspnea 0-4      Progression   Progression Continue progressive overload as per policy without signs/symptoms or physical distress.      Resistance Training  Training Prescription Yes    Weight 3    Reps 10-15          Perform Capillary Blood Glucose checks as needed.  Exercise Prescription Changes:   Exercise Prescription Changes     Row Name 04/30/24 1100 05/12/24 1030 05/28/24 1100 06/14/24 1600       Response to Exercise   Blood Pressure (Admit)  102/64 108/72 106/58 114/66    Blood Pressure (Exercise) 116/76 120/76 -- --    Blood Pressure (Exit) 106/70 116/66 106/60 102/66    Heart Rate (Admit) 62 bpm 84 bpm 76 bpm 65 bpm    Heart Rate (Exercise) 90 bpm 98 bpm 103 bpm 90 bpm    Heart Rate (Exit) 69 bpm 80 bpm 76 bpm 66 bpm    Rating of Perceived Exertion (Exercise) 10 14 14 12     Symptoms None None None None    Comments Pt's first day in the CRP2 program Reviewed METs Reviewed METs, goals, and home exercise Rx Reviewed METs    Duration Continue with 30 min of aerobic exercise without signs/symptoms of physical distress. Continue with 30 min of aerobic exercise without signs/symptoms of physical distress. Continue with 30 min of aerobic exercise without signs/symptoms of physical distress. Continue with 30 min of aerobic exercise without signs/symptoms of physical distress.    Intensity THRR unchanged THRR unchanged THRR unchanged THRR unchanged      Progression   Progression Continue to progress workloads to maintain intensity without signs/symptoms of physical distress. Continue to progress workloads to maintain intensity without signs/symptoms of physical distress. Continue to progress workloads to maintain intensity without signs/symptoms of physical distress. Continue to progress workloads to maintain intensity without signs/symptoms of physical distress.    Average METs 2.2 3 3.55 3.45      Resistance Training   Training Prescription Yes No Yes Yes    Weight 3 No wts on wednesdays 3 lbs 3 lbs    Reps 10-15 -- 10-15 10-15    Time 5 Minutes -- 5 Minutes 5 Minutes      Interval Training   Interval Training No No No No      Recumbant Bike   Level 2 2 2 3     RPM 59 85 100 81    Watts 20 28 57 52    Minutes 15 15 15 15     METs 2.3 3.2 3.9 3.6      NuStep   Level 2 2 2 2     SPM 77 107 132 125    Minutes 15 15 15 15     METs 2.1 2.8 3.2 3.3      Home Exercise Plan   Plans to continue exercise at -- -- Home (comment) Home  (comment)    Frequency -- -- Add 3 additional days to program exercise sessions. Add 3 additional days to program exercise sessions.    Initial Home Exercises Provided -- -- 05/28/24 05/28/24       Exercise Comments:   Exercise Comments     Row Name 04/30/24 1121 05/12/24 1030 05/28/24 0823 06/14/24 1659     Exercise Comments Pt's first day in the CRP2 program. Pt exercised without compliants and tolerated session well. Reviewed METs, pt is making progress. RPE's are currently at 12-14 on modalites, no increases at this time. Reviewed goals, METs and home exercise Rx. Pt is progressing, peak METs have increased to 2.3 to 3.6 and average METs form 2.2 to 3.2. Pt verbaalized understnading of the  home exericse Rx and was provided a copy. Reviewed METs. Has been progressing on METs. MET level dropped on bikr today with increased workload level. Encouraged to keep RPM a previous pace to improve MET level.       Exercise Goals and Review:   Exercise Goals     Row Name 04/21/24 1138             Exercise Goals   Increase Physical Activity Yes       Intervention Provide advice, education, support and counseling about physical activity/exercise needs.;Develop an individualized exercise prescription for aerobic and resistive training based on initial evaluation findings, risk stratification, comorbidities and participant's personal goals.       Expected Outcomes Short Term: Attend rehab on a regular basis to increase amount of physical activity.;Long Term: Exercising regularly at least 3-5 days a week.;Long Term: Add in home exercise to make exercise part of routine and to increase amount of physical activity.       Increase Strength and Stamina Yes       Intervention Develop an individualized exercise prescription for aerobic and resistive training based on initial evaluation findings, risk stratification, comorbidities and participant's personal goals.;Provide advice, education, support and  counseling about physical activity/exercise needs.       Expected Outcomes Long Term: Improve cardiorespiratory fitness, muscular endurance and strength as measured by increased METs and functional capacity ( );Short Term: Perform resistance training exercises routinely during rehab and add in resistance training at home;Short Term: Increase workloads from initial exercise prescription for resistance, speed, and METs.       Able to understand and use rate of perceived exertion (RPE) scale Yes       Intervention Provide education and explanation on how to use RPE scale       Expected Outcomes Long Term:  Able to use RPE to guide intensity level when exercising independently;Short Term: Able to use RPE daily in rehab to express subjective intensity level       Knowledge and understanding of Target Heart Rate Range (THRR) Yes       Intervention Provide education and explanation of THRR including how the numbers were predicted and where they are located for reference       Expected Outcomes Long Term: Able to use THRR to govern intensity when exercising independently;Short Term: Able to state/look up THRR;Short Term: Able to use daily as guideline for intensity in rehab       Understanding of Exercise Prescription Yes       Intervention Provide education, explanation, and written materials on patient's individual exercise prescription       Expected Outcomes Short Term: Able to explain program exercise prescription;Long Term: Able to explain home exercise prescription to exercise independently          Exercise Goals Re-Evaluation :  Exercise Goals Re-Evaluation     Row Name 04/30/24 1120 05/28/24 0800           Exercise Goal Re-Evaluation   Exercise Goals Review Increase Physical Activity;Understanding of Exercise Prescription;Increase Strength and Stamina;Knowledge and understanding of Target Heart Rate Range (THRR);Able to understand and use rate of perceived exertion (RPE) scale Increase  Physical Activity;Understanding of Exercise Prescription;Increase Strength and Stamina;Knowledge and understanding of Target Heart Rate Range (THRR);Able to understand and use rate of perceived exertion (RPE) scale      Comments Pt's first day in the CRP2 program. Pt understands the exercise Rx, RPE sclae and THRR. Reviewed METs, goals and home exercise  Rx. Pt is making good progress on his goals of imrpoved strength and stamina. Pt voices he is doing more at home and feeling stronger. Pt has been doing some walking at for about 15-20 minutes. Pt will increase that to 30 minutes 2-3x/week.      Expected Outcomes Will continue to monitor the patient and progress exercise workloads as tolerated. Will continue to monitor the patient and progress exercise workloads as tolerated.         Discharge Exercise Prescription (Final Exercise Prescription Changes):  Exercise Prescription Changes - 06/14/24 1600       Response to Exercise   Blood Pressure (Admit) 114/66    Blood Pressure (Exit) 102/66    Heart Rate (Admit) 65 bpm    Heart Rate (Exercise) 90 bpm    Heart Rate (Exit) 66 bpm    Rating of Perceived Exertion (Exercise) 12    Symptoms None    Comments Reviewed METs    Duration Continue with 30 min of aerobic exercise without signs/symptoms of physical distress.    Intensity THRR unchanged      Progression   Progression Continue to progress workloads to maintain intensity without signs/symptoms of physical distress.    Average METs 3.45      Resistance Training   Training Prescription Yes    Weight 3 lbs    Reps 10-15    Time 5 Minutes      Interval Training   Interval Training No      Recumbant Bike   Level 3    RPM 81    Watts 52    Minutes 15    METs 3.6      NuStep   Level 2    SPM 125    Minutes 15    METs 3.3      Home Exercise Plan   Plans to continue exercise at Home (comment)    Frequency Add 3 additional days to program exercise sessions.    Initial Home  Exercises Provided 05/28/24          Nutrition:  Target Goals: Understanding of nutrition guidelines, daily intake of sodium 1500mg , cholesterol 200mg , calories 30% from fat and 7% or less from saturated fats, daily to have 5 or more servings of fruits and vegetables.  Biometrics:  Pre Biometrics - 04/21/24 1132       Pre Biometrics   Waist Circumference 39.25 inches    Hip Circumference 41 inches    Waist to Hip Ratio 0.96 %    Triceps Skinfold 17 mm    % Body Fat 26.4 %    Grip Strength 33 kg    Flexibility 14 in    Single Leg Stand 30 seconds           Nutrition Therapy Plan and Nutrition Goals:   Nutrition Assessments:  MEDIFICTS Score Key: >=70 Need to make dietary changes  40-70 Heart Healthy Diet <= 40 Therapeutic Level Cholesterol Diet   Flowsheet Row INTENSIVE CARDIAC REHAB from 05/05/2024 in [facility]  Picture Your Plate Total Score on Admission 70   Picture Your Plate Scores: <59 Unhealthy dietary pattern with much room for improvement. 41-50 Dietary pattern unlikely to meet recommendations for good health and room for improvement. 51-60 More healthful dietary pattern, with some room for improvement.  >60 Healthy dietary pattern, although there may be some specific behaviors that could be improved.    Nutrition Goals Re-Evaluation:  Nutrition Goals Re-Evaluation:   Nutrition Goals Discharge (Final Nutrition Goals Re-Evaluation):   Psychosocial: Target Goals: Acknowledge presence or absence of significant depression and/or stress, maximize coping skills, provide positive support system. Participant is able to verbalize types and ability to use techniques and skills needed for reducing stress and depression.  Initial Review & Psychosocial Screening:  Initial Psych Review & Screening - 04/21/24 1138       Initial Review   Current issues with None Identified      Family Dynamics    Good Support System? Yes   wife     Barriers   Psychosocial barriers to participate in program There are no identifiable barriers or psychosocial needs.      Screening Interventions   Interventions Encouraged to exercise;Provide feedback about the scores to participant    Expected Outcomes Long Term goal: The participant improves quality of Life and PHQ9 Scores as seen by post scores and/or verbalization of changes;Short Term goal: Identification and review with participant of any Quality of Life or Depression concerns found by scoring the questionnaire.          Quality of Life Scores:  Quality of Life - 04/21/24 1323       Quality of Life   Select Quality of Life      Quality of Life Scores   Health/Function Pre 20.63 %    Socioeconomic Pre 26.21 %    Psych/Spiritual Pre 24.5 %    Family Pre 26 %    GLOBAL Pre 23.2 %         Scores of 19 and below usually indicate a poorer quality of life in these areas.  A difference of  2-3 points is a clinically meaningful difference.  A difference of 2-3 points in the total score of the Quality of Life Index has been associated with significant improvement in overall quality of life, self-image, physical symptoms, and general health in studies assessing change in quality of life.  PHQ-9: Review Flowsheet       04/21/2024  Depression screen PHQ 2/9  Decreased Interest 0  Down, Depressed, Hopeless 0  PHQ - 2 Score 0  Altered sleeping 0  Tired, decreased energy 1  Change in appetite 0  Feeling bad or failure about yourself  0  Trouble concentrating 0  Moving slowly or fidgety/restless 1  Suicidal thoughts 0  PHQ-9 Score 2   Difficult doing work/chores Not difficult at all    Details       Data saved with a previous flowsheet row definition        Interpretation of Total Score  Total Score Depression Severity:  1-4 = Minimal depression, 5-9 = Mild depression, 10-14 = Moderate depression, 15-19 = Moderately severe  depression, 20-27 = Severe depression   Psychosocial Evaluation and Intervention:   Psychosocial Re-Evaluation:  Psychosocial Re-Evaluation     Row Name 05/05/24 0839 06/15/24 1127           Psychosocial Re-Evaluation   Current issues with None Identified None Identified      Comments Jeffory says he has health insurance now and hopes to return to work at the end of the year. 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 Reubin continues not to voice any increased concerns or stressors during exercise at cardiac rehab. 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   Vocational Rehab Evaluation & Intervention:  Vocational Rehab - 04/21/24 1138       Initial Vocational Rehab Evaluation & Intervention   Assessment shows need for Vocational Rehabilitation No   returning to work 12/25         Education: Education Goals: Education classes will be provided on a weekly basis, covering required topics. Participant will state understanding/return demonstration of topics presented.    Education     Row Name 04/28/24 0800     Education   Cardiac Education Topics Pritikin   Secondary School Teacher School   Educator Nurse;Respiratory Therapist   Weekly Topic Tasty Appetizers and Snacks   Instruction Review Code 1- Verbalizes  Understanding   Class Start Time 0815   Class Stop Time 0846   Class Time Calculation (min) 31 min    Row Name 04/30/24 0800     Education   Cardiac Education Topics Pritikin   [employer] Works[employer]     Workshops   Educator Exercise Physiologist   Select Exercise   Exercise Workshop Managing Heart Disease: Your Path to a Healthier Heart   Instruction Review Code 1- Verbalizes Understanding   Class Start Time [phone]   Class Stop Time 0900   Class Time Calculation (min) 41 min    Row Name 05/03/24 0700     Education   Cardiac Education Topics Pritikin   Select Core Videos     Core Videos   Educator Exercise Physiologist   Select Psychosocial   Psychosocial Healthy Minds, Bodies, Hearts   Instruction Review Code 1- Verbalizes Understanding   Class Start Time 0815   Class Stop Time 0847   Class Time Calculation (min) 32 min    Row Name 05/05/24 0800     Education   Cardiac Education Topics Pritikin   Secondary School Teacher School   Educator Nurse;Respiratory Therapist   Weekly Topic Adding Flavor - Sodium-Free   Instruction Review Code 1- Verbalizes Understanding   Class Start Time 0815   Class Stop Time 0847   Class Time Calculation (min) 32 min    Row Name 05/07/24 0800     Education   Cardiac Education Topics Pritikin   [employer]     Workshops   Educator Dietitian   Select Nutrition   Nutrition Workshop Label Reading   Instruction Review Code 1- Verbalizes Understanding   Class Start Time 0815   Class Stop Time [phone]   Class Time Calculation (min) 37 min    Row Name 05/10/24 0900     Education   Cardiac Education Topics Pritikin   [employer] Works[employer]     Workshops   Educator Exercise Physiologist   Select Exercise   Exercise Workshop Location Manager and Fall Prevention   Instruction Review Code 1- Verbalizes Understanding   Class Start Time 0815   Class Stop Time [phone]  Class Time Calculation (min) 35 min    Row Name 05/12/24 0800      Education   Cardiac Education Topics Pritikin   Secondary School Teacher School   Educator Nurse;Respiratory Therapist   Weekly Topic Fast and Healthy Breakfasts   Instruction Review Code 1- Verbalizes Understanding   Class Start Time 0815   Class Stop Time [phone]   Class Time Calculation (min) 37 min    Row Name 05/14/24 0800     Education   Cardiac Education Topics Pritikin   Select Core Videos     Core Videos   Educator Dietitian   Select Nutrition   Nutrition Other  label reading   Instruction Review Code 1- Verbalizes Understanding   Class Start Time 0815   Class Stop Time 0847   Class Time Calculation (min) 32 min    Row Name 05/17/24 0800     Education   Cardiac Education Topics Pritikin   Select Core Videos     Core Videos   Educator Exercise Physiologist   Select General Education   General Education Metabolic Syndrome and Belly Fat   Instruction Review Code 1- Verbalizes Understanding   Class Start Time [phone]   Class Stop Time 0849   Class Time Calculation (min) 38 min    Row Name 05/19/24 0800     Education   Cardiac Education Topics Pritikin   Secondary School Teacher School   Educator Dietitian;Respiratory Therapist   Weekly Topic Personalizing Your Pritikin Plate   Instruction Review Code 1- Verbalizes Understanding   Class Start Time [phone]   Class Stop Time 0851   Class Time Calculation (min) 39 min    Row Name 05/21/24 0800     Education   Cardiac Education Topics Pritikin   Select Core Videos     Core Videos   Educator Dietitian   Select Nutrition   Nutrition Overview of the Pritikin Eating Plan   Instruction Review Code 1- Verbalizes Understanding   Class Start Time 0815   Class Stop Time 0855   Class Time Calculation (min) 40 min    Row Name 05/24/24 0800     Education   Cardiac Education Topics Pritikin   [employer] Works[employer]     Workshops   Educator Exercise Physiologist   Select Psychosocial    Psychosocial Workshop Recognizing and Reducing Stress   Instruction Review Code 1- Verbalizes Understanding   Class Start Time [phone]   Class Stop Time 0900   Class Time Calculation (min) 48 min    Row Name 05/28/24 0800     Education   Cardiac Education Topics Pritikin   Select Core Videos     Core Videos   Educator Dietitian   Select Nutrition   Nutrition Calorie Density   Instruction Review Code 1- Verbalizes Understanding   Class Start Time [phone]   Class Stop Time 0848   Class Time Calculation (min) 32 min    Row Name 05/31/24 0800     Education   Cardiac Education Topics Pritikin   Psychologist, Forensic Exercise Education   Exercise Education Move It!   Instruction Review Code 1- Verbalizes Understanding   Class Start Time 0815   Class Stop Time 0854   Class Time Calculation (min) 39 min    Row Name 06/02/24 0800     Education   Cardiac  Education Topics Market Researcher - Meals in a Snap   Instruction Review Code 1- Verbalizes Understanding   Class Start Time 0815   Class Stop Time [id number]   Class Time Calculation (min) 49 min    Row Name 06/04/24 0900     Education   Cardiac Education Topics Pritikin   [employer] Works[employer]     Workshops   Educator Exercise Physiologist   Select Exercise   Exercise Workshop Exercise Basics: Building Your Action Plan   Instruction Review Code 1- Verbalizes Understanding   Class Start Time 0815   Class Stop Time 0851   Class Time Calculation (min) 36 min    Row Name 06/07/24 0800     Education   Cardiac Education Topics Pritikin   Glass Blower/designer Nutrition   Nutrition Workshop Targeting Your Nutrition Priorities   Instruction Review Code 1- Verbalizes Understanding   Class Start Time 0815   Class Stop Time 0901   Class Time  Calculation (min) 46 min    Row Name 06/09/24 0700     Education   Cardiac Education Topics Pritikin   Secondary School Teacher School   Educator Dietitian   Weekly Topic One-Pot Wonders   Instruction Review Code 1- Verbalizes Understanding   Class Start Time [phone]   Class Stop Time 0858   Class Time Calculation (min) 44 min    Row Name 06/14/24 0900     Education   Cardiac Education Topics Pritikin   [employer] Workshops     Workshops   Educator Exercise Physiologist   [employer] Psychosocial   Psychosocial Workshop Focused Goals, Sustainable Changes   Instruction Review Code 1- Verbalizes Understanding   Class Start Time 0815   Class Stop Time 0850   Class Time Calculation (min) 35 min      Core Videos: Exercise    Move It!  Clinical staff conducted group or individual video education with verbal and written material and guidebook.  Patient learns the recommended Pritikin exercise program. Exercise with the goal of living a long, healthy life. Some of the health benefits of exercise include controlled diabetes, healthier blood pressure levels, improved cholesterol levels, improved heart and lung capacity, improved sleep, and better body composition. Everyone should speak with their doctor before starting or changing an exercise routine.  Biomechanical Limitations Clinical staff conducted group or individual video education with verbal and written material and guidebook.  Patient learns how biomechanical limitations can impact exercise and how we can mitigate and possibly overcome limitations to have an impactful and balanced exercise routine.  Body Composition Clinical staff conducted group or individual video education with verbal and written material and guidebook.  Patient learns that body composition (ratio of muscle mass to fat mass) is a key component to assessing overall fitness, rather than body weight alone. Increased fat mass, especially visceral belly fat,  can put us  at increased risk for metabolic syndrome, type 2 diabetes, heart disease, and even death. It is recommended to combine diet and exercise (cardiovascular and resistance training) to improve your body composition. Seek guidance from your physician and exercise physiologist before implementing an exercise routine.  Exercise Action Plan Clinical staff conducted group or individual video education with verbal and written material and guidebook.  Patient learns the recommended strategies to achieve  and enjoy long-term exercise adherence, including variety, self-motivation, self-efficacy, and positive decision making. Benefits of exercise include fitness, good health, weight management, more energy, better sleep, less stress, and overall well-being.  Medical   Heart Disease Risk Reduction Clinical staff conducted group or individual video education with verbal and written material and guidebook.  Patient learns our heart is our most vital organ as it circulates oxygen, nutrients, white blood cells, and hormones throughout the entire body, and carries waste away. Data supports a plant-based eating plan like the Pritikin Program for its effectiveness in slowing progression of and reversing heart disease. The video provides a number of recommendations to address heart disease.   Metabolic Syndrome and Belly Fat  Clinical staff conducted group or individual video education with verbal and written material and guidebook.  Patient learns what metabolic syndrome is, how it leads to heart disease, and how one can reverse it and keep it from coming back. You have metabolic syndrome if you have 3 of the following 5 criteria: abdominal obesity, high blood pressure, high triglycerides, low HDL cholesterol, and high blood sugar.  Hypertension and Heart Disease Clinical staff conducted group or individual video education with verbal and written material and guidebook.  Patient learns that high blood pressure,  or hypertension, is very common in the [place] . Hypertension is largely due to excessive salt intake, but other important risk factors include being overweight, physical inactivity, drinking too much alcohol, smoking, and not eating enough potassium from fruits and vegetables. High blood pressure is a leading risk factor for heart attack, stroke, congestive heart failure, dementia, kidney failure, and premature death. Long-term effects of excessive salt intake include stiffening of the arteries and thickening of heart muscle and organ damage. Recommendations include ways to reduce hypertension and the risk of heart disease.  Diseases of Our Time - Focusing on Diabetes Clinical staff conducted group or individual video education with verbal and written material and guidebook.  Patient learns why the best way to stop diseases of our time is prevention, through food and other lifestyle changes. Medicine (such as prescription pills and surgeries) is often only a Band-Aid on the problem, not a long-term solution. Most common diseases of our time include obesity, type 2 diabetes, hypertension, heart disease, and cancer. The Pritikin Program is recommended and has been proven to help reduce, reverse, and/or prevent the damaging effects of metabolic syndrome.  Nutrition   Overview of the Pritikin Eating Plan  Clinical staff conducted group or individual video education with verbal and written material and guidebook.  Patient learns about the Pritikin Eating Plan for disease risk reduction. The Pritikin Eating Plan emphasizes a wide variety of unrefined, minimally-processed carbohydrates, like fruits, vegetables, whole grains, and legumes. Go, Caution, and Stop food choices are explained. Plant-based and lean animal proteins are emphasized. Rationale provided for low sodium intake for blood pressure control, low added sugars for blood sugar stabilization, and low added fats and oils for coronary artery disease  risk reduction and weight management.  Calorie Density  Clinical staff conducted group or individual video education with verbal and written material and guidebook.  Patient learns about calorie density and how it impacts the Pritikin Eating Plan. Knowing the characteristics of the food you choose will help you decide whether those foods will lead to weight gain or weight loss, and whether you want to consume more or less of them. Weight loss is usually a side effect of the Pritikin Eating Plan because of its focus on  low calorie-dense foods.  Label Reading  Clinical staff conducted group or individual video education with verbal and written material and guidebook.  Patient learns about the Pritikin recommended label reading guidelines and corresponding recommendations regarding calorie density, added sugars, sodium content, and whole grains.  Dining Out - Part 1  Clinical staff conducted group or individual video education with verbal and written material and guidebook.  Patient learns that restaurant meals can be sabotaging because they can be so high in calories, fat, sodium, and/or sugar. Patient learns recommended strategies on how to positively address this and avoid unhealthy pitfalls.  Facts on Fats  Clinical staff conducted group or individual video education with verbal and written material and guidebook.  Patient learns that lifestyle modifications can be just as effective, if not more so, as many medications for lowering your risk of heart disease. A Pritikin lifestyle can help to reduce your risk of inflammation and atherosclerosis (cholesterol build-up, or plaque, in the artery walls). Lifestyle interventions such as dietary choices and physical activity address the cause of atherosclerosis. A review of the types of fats and their impact on blood cholesterol levels, along with dietary recommendations to reduce fat intake is also included.  Nutrition Action Plan  Clinical staff  conducted group or individual video education with verbal and written material and guidebook.  Patient learns how to incorporate Pritikin recommendations into their lifestyle. Recommendations include planning and keeping personal health goals in mind as an important part of their success.  Healthy Mind-Set    Healthy Minds, Bodies, Hearts  Clinical staff conducted group or individual video education with verbal and written material and guidebook.  Patient learns how to identify when they are stressed. Video will discuss the impact of that stress, as well as the many benefits of stress management. Patient will also be introduced to stress management techniques. The way we think, act, and feel has an impact on our hearts.  How Our Thoughts Can Heal Our Hearts  Clinical staff conducted group or individual video education with verbal and written material and guidebook.  Patient learns that negative thoughts can cause depression and anxiety. This can result in negative lifestyle behavior and serious health problems. Cognitive behavioral therapy is an effective method to help control our thoughts in order to change and improve our emotional outlook.  Additional Videos:  Exercise    Improving Performance  Clinical staff conducted group or individual video education with verbal and written material and guidebook.  Patient learns to use a non-linear approach by alternating intensity levels and lengths of time spent exercising to help burn more calories and lose more body fat. Cardiovascular exercise helps improve heart health, metabolism, hormonal balance, blood sugar control, and recovery from fatigue. Resistance training improves strength, endurance, balance, coordination, reaction time, metabolism, and muscle mass. Flexibility exercise improves circulation, posture, and balance. Seek guidance from your physician and exercise physiologist before implementing an exercise routine and learn your capabilities  and proper form for all exercise.  Introduction to Yoga  Clinical staff conducted group or individual video education with verbal and written material and guidebook.  Patient learns about yoga, a discipline of the coming together of mind, breath, and body. The benefits of yoga include improved flexibility, improved range of motion, better posture and core strength, increased lung function, weight loss, and positive self-image. Yoga's heart health benefits include lowered blood pressure, healthier heart rate, decreased cholesterol and triglyceride levels, improved immune function, and reduced stress. Seek guidance from your physician and  exercise physiologist before implementing an exercise routine and learn your capabilities and proper form for all exercise.  Medical   Aging: Enhancing Your Quality of Life  Clinical staff conducted group or individual video education with verbal and written material and guidebook.  Patient learns key strategies and recommendations to stay in good physical health and enhance quality of life, such as prevention strategies, having an advocate, securing a Health Care Proxy and Power of Attorney, and keeping a list of medications and system for tracking them. It also discusses how to avoid risk for bone loss.  Biology of Weight Control  Clinical staff conducted group or individual video education with verbal and written material and guidebook.  Patient learns that weight gain occurs because we consume more calories than we burn (eating more, moving less). Even if your body weight is normal, you may have higher ratios of fat compared to muscle mass. Too much body fat puts you at increased risk for cardiovascular disease, heart attack, stroke, type 2 diabetes, and obesity-related cancers. In addition to exercise, following the Pritikin Eating Plan can help reduce your risk.  Decoding Lab Results  Clinical staff conducted group or individual video education with verbal and  written material and guidebook.  Patient learns that lab test reflects one measurement whose values change over time and are influenced by many factors, including medication, stress, sleep, exercise, food, hydration, pre-existing medical conditions, and more. It is recommended to use the knowledge from this video to become more involved with your lab results and evaluate your numbers to speak with your doctor.   Diseases of Our Time - Overview  Clinical staff conducted group or individual video education with verbal and written material and guidebook.  Patient learns that according to the CDC, 50% to 70% of chronic diseases (such as obesity, type 2 diabetes, elevated lipids, hypertension, and heart disease) are avoidable through lifestyle improvements including healthier food choices, listening to satiety cues, and increased physical activity.  Sleep Disorders Clinical staff conducted group or individual video education with verbal and written material and guidebook.  Patient learns how good quality and duration of sleep are important to overall health and well-being. Patient also learns about sleep disorders and how they impact health along with recommendations to address them, including discussing with a physician.  Nutrition  Dining Out - Part 2 Clinical staff conducted group or individual video education with verbal and written material and guidebook.  Patient learns how to plan ahead and communicate in order to maximize their dining experience in a healthy and nutritious manner. Included are recommended food choices based on the type of restaurant the patient is visiting.   Fueling a Banker conducted group or individual video education with verbal and written material and guidebook.  There is a strong connection between our food choices and our health. Diseases like obesity and type 2 diabetes are very prevalent and are in large-part due to lifestyle choices. The  Pritikin Eating Plan provides plenty of food and hunger-curbing satisfaction. It is easy to follow, affordable, and helps reduce health risks.  Menu Workshop  Clinical staff conducted group or individual video education with verbal and written material and guidebook.  Patient learns that restaurant meals can sabotage health goals because they are often packed with calories, fat, sodium, and sugar. Recommendations include strategies to plan ahead and to communicate with the manager, chef, or server to help order a healthier meal.  Planning Your Eating Strategy  Clinical staff  conducted group or individual video education with verbal and written material and guidebook.  Patient learns about the Pritikin Eating Plan and its benefit of reducing the risk of disease. The Pritikin Eating Plan does not focus on calories. Instead, it emphasizes high-quality, nutrient-rich foods. By knowing the characteristics of the foods, we choose, we can determine their calorie density and make informed decisions.  Targeting Your Nutrition Priorities  Clinical staff conducted group or individual video education with verbal and written material and guidebook.  Patient learns that lifestyle habits have a tremendous impact on disease risk and progression. This video provides eating and physical activity recommendations based on your personal health goals, such as reducing LDL cholesterol, losing weight, preventing or controlling type 2 diabetes, and reducing high blood pressure.  Vitamins and Minerals  Clinical staff conducted group or individual video education with verbal and written material and guidebook.  Patient learns different ways to obtain key vitamins and minerals, including through a recommended healthy diet. It is important to discuss all supplements you take with your doctor.   Healthy Mind-Set    Smoking Cessation  Clinical staff conducted group or individual video education with verbal and written  material and guidebook.  Patient learns that cigarette smoking and tobacco addiction pose a serious health risk which affects millions of people. Stopping smoking will significantly reduce the risk of heart disease, lung disease, and many forms of cancer. Recommended strategies for quitting are covered, including working with your doctor to develop a successful plan.  Culinary   Becoming a Set Designer conducted group or individual video education with verbal and written material and guidebook.  Patient learns that cooking at home can be healthy, cost-effective, quick, and puts them in control. Keys to cooking healthy recipes will include looking at your recipe, assessing your equipment needs, planning ahead, making it simple, choosing cost-effective seasonal ingredients, and limiting the use of added fats, salts, and sugars.  Cooking - Breakfast and Snacks  Clinical staff conducted group or individual video education with verbal and written material and guidebook.  Patient learns how important breakfast is to satiety and nutrition through the entire day. Recommendations include key foods to eat during breakfast to help stabilize blood sugar levels and to prevent overeating at meals later in the day. Planning ahead is also a key component.  Cooking - Educational Psychologist conducted group or individual video education with verbal and written material and guidebook.  Patient learns eating strategies to improve overall health, including an approach to cook more at home. Recommendations include thinking of animal protein as a side on your plate rather than center stage and focusing instead on lower calorie dense options like vegetables, fruits, whole grains, and plant-based proteins, such as beans. Making sauces in large quantities to freeze for later and leaving the skin on your vegetables are also recommended to maximize your experience.  Cooking - Healthy Salads and  Dressing Clinical staff conducted group or individual video education with verbal and written material and guidebook.  Patient learns that vegetables, fruits, whole grains, and legumes are the foundations of the Pritikin Eating Plan. Recommendations include how to incorporate each of these in flavorful and healthy salads, and how to create homemade salad dressings. Proper handling of ingredients is also covered. Cooking - Soups and [employer] - Soups and Desserts Clinical staff conducted group or individual video education with verbal and written material and guidebook.  Patient learns that Pritikin soups and  desserts make for easy, nutritious, and delicious snacks and meal components that are low in sodium, fat, sugar, and calorie density, while high in vitamins, minerals, and filling fiber. Recommendations include simple and healthy ideas for soups and desserts.   Overview     The Pritikin Solution Program Overview Clinical staff conducted group or individual video education with verbal and written material and guidebook.  Patient learns that the results of the Pritikin Program have been documented in more than 100 articles published in peer-reviewed journals, and the benefits include reducing risk factors for (and, in some cases, even reversing) high cholesterol, high blood pressure, type 2 diabetes, obesity, and more! An overview of the three key pillars of the Pritikin Program will be covered: eating well, doing regular exercise, and having a healthy mind-set.  WORKSHOPS  Exercise: Exercise Basics: Building Your Action Plan Clinical staff led group instruction and group discussion with PowerPoint presentation and patient guidebook. To enhance the learning environment the use of posters, models and videos may be added. At the conclusion of this workshop, patients will comprehend the difference between physical activity and exercise, as well as the benefits of incorporating both, into  their routine. Patients will understand the FITT (Frequency, Intensity, Time, and Type) principle and how to use it to build an exercise action plan. In addition, safety concerns and other considerations for exercise and cardiac rehab will be addressed by the presenter. The purpose of this lesson is to promote a comprehensive and effective weekly exercise routine in order to improve patients' overall level of fitness.   Managing Heart Disease: Your Path to a Healthier Heart Clinical staff led group instruction and group discussion with PowerPoint presentation and patient guidebook. To enhance the learning environment the use of posters, models and videos may be added.At the conclusion of this workshop, patients will understand the anatomy and physiology of the heart. Additionally, they will understand how Pritikin's three pillars impact the risk factors, the progression, and the management of heart disease.  The purpose of this lesson is to provide a high-level overview of the heart, heart disease, and how the Pritikin lifestyle positively impacts risk factors.  Exercise Biomechanics Clinical staff led group instruction and group discussion with PowerPoint presentation and patient guidebook. To enhance the learning environment the use of posters, models and videos may be added. Patients will learn how the structural parts of their bodies function and how these functions impact their daily activities, movement, and exercise. Patients will learn how to promote a neutral spine, learn how to manage pain, and identify ways to improve their physical movement in order to promote healthy living. The purpose of this lesson is to expose patients to common physical limitations that impact physical activity. Participants will learn practical ways to adapt and manage aches and pains, and to minimize their effect on regular exercise. Patients will learn how to maintain good posture while sitting, walking, and  lifting.  Balance Training and Fall Prevention  Clinical staff led group instruction and group discussion with PowerPoint presentation and patient guidebook. To enhance the learning environment the use of posters, models and videos may be added. At the conclusion of this workshop, patients will understand the importance of their sensorimotor skills (vision, proprioception, and the vestibular system) in maintaining their ability to balance as they age. Patients will apply a variety of balancing exercises that are appropriate for their current level of function. Patients will understand the common causes for poor balance, possible solutions to these problems, and  ways to modify their physical environment in order to minimize their fall risk. The purpose of this lesson is to teach patients about the importance of maintaining balance as they age and ways to minimize their risk of falling.  WORKSHOPS   Nutrition:  Fueling a Ship Broker led group instruction and group discussion with PowerPoint presentation and patient guidebook. To enhance the learning environment the use of posters, models and videos may be added. Patients will review the foundational principles of the Pritikin Eating Plan and understand what constitutes a serving size in each of the food groups. Patients will also learn Pritikin-friendly foods that are better choices when away from home and review make-ahead meal and snack options. Calorie density will be reviewed and applied to three nutrition priorities: weight maintenance, weight loss, and weight gain. The purpose of this lesson is to reinforce (in a group setting) the key concepts around what patients are recommended to eat and how to apply these guidelines when away from home by planning and selecting Pritikin-friendly options. Patients will understand how calorie density may be adjusted for different weight management goals.  Mindful Eating  Clinical staff led  group instruction and group discussion with PowerPoint presentation and patient guidebook. To enhance the learning environment the use of posters, models and videos may be added. Patients will briefly review the concepts of the Pritikin Eating Plan and the importance of low-calorie dense foods. The concept of mindful eating will be introduced as well as the importance of paying attention to internal hunger signals. Triggers for non-hunger eating and techniques for dealing with triggers will be explored. The purpose of this lesson is to provide patients with the opportunity to review the basic principles of the Pritikin Eating Plan, discuss the value of eating mindfully and how to measure internal cues of hunger and fullness using the Hunger Scale. Patients will also discuss reasons for non-hunger eating and learn strategies to use for controlling emotional eating.  Targeting Your Nutrition Priorities Clinical staff led group instruction and group discussion with PowerPoint presentation and patient guidebook. To enhance the learning environment the use of posters, models and videos may be added. Patients will learn how to determine their genetic susceptibility to disease by reviewing their family history. Patients will gain insight into the importance of diet as part of an overall healthy lifestyle in mitigating the impact of genetics and other environmental insults. The purpose of this lesson is to provide patients with the opportunity to assess their personal nutrition priorities by looking at their family history, their own health history and current risk factors. Patients will also be able to discuss ways of prioritizing and modifying the Pritikin Eating Plan for their highest risk areas  Menu  Clinical staff led group instruction and group discussion with PowerPoint presentation and patient guidebook. To enhance the learning environment the use of posters, models and videos may be added. Using menus  brought in from [employer], or printed from [employer], patients will apply the Pritikin dining out guidelines that were presented in the [employer] video. Patients will also be able to practice these guidelines in a variety of provided scenarios. The purpose of this lesson is to provide patients with the opportunity to practice hands-on learning of the Pritikin Dining Out guidelines with actual menus and practice scenarios.  Label Reading Clinical staff led group instruction and group discussion with PowerPoint presentation and patient guidebook. To enhance the learning environment the use of posters, models and  videos may be added. Patients will review and discuss the Pritikin label reading guidelines presented in [employer]ritikin's Label Reading Educational series video. Using fool labels brought in from local grocery stores and markets, patients will apply the label reading guidelines and determine if the packaged food meet the Pritikin guidelines. The purpose of this lesson is to provide patients with the opportunity to review, discuss, and practice hands-on learning of the Pritikin Label Reading guidelines with actual packaged food labels. Cooking School  Pritikin's [employer] are designed to teach patients ways to prepare quick, simple, and affordable recipes at home. The importance of nutrition's role in chronic disease risk reduction is reflected in its emphasis in the overall Pritikin program. By learning how to prepare essential core Pritikin Eating Plan recipes, patients will increase control over what they eat; be able to customize the flavor of foods without the use of added salt, sugar, or fat; and improve the quality of the food they consume. By learning a set of core recipes which are easily assembled, quickly prepared, and affordable, patients are more likely to prepare more healthy foods at home. These workshops focus on convenient breakfasts, simple  entres, side dishes, and desserts which can be prepared with minimal effort and are consistent with nutrition recommendations for cardiovascular risk reduction. Cooking [employer] are taught by a armed forces logistics/support/administrative officer (RD) who has been trained by the [employer]. The chef or RD has a clear understanding of the importance of minimizing - if not completely eliminating - added fat, sugar, and sodium in recipes. Throughout the series of Cooking School Workshop sessions, patients will learn about healthy ingredients and efficient methods of cooking to build confidence in their capability to prepare    Cooking School weekly topics:  Adding Flavor- Sodium-Free  Fast and Healthy Breakfasts  Powerhouse Plant-Based Proteins  Satisfying Salads and Dressings  Simple Sides and Sauces  International Cuisine-Spotlight on the [employer] Zones  Delicious Desserts  Savory Soups  [employer] - Meals in a Astronomer Appetizers and Snacks  Comforting Weekend Breakfasts  One-Pot Wonders   Fast Evening Meals  Landscape Architect Your Pritikin Plate  WORKSHOPS   Healthy Mindset (Psychosocial):  Focused Goals, Sustainable Changes Clinical staff led group instruction and group discussion with PowerPoint presentation and patient guidebook. To enhance the learning environment the use of posters, models and videos may be added. Patients will be able to apply effective goal setting strategies to establish at least one personal goal, and then take consistent, meaningful action toward that goal. They will learn to identify common barriers to achieving personal goals and develop strategies to overcome them. Patients will also gain an understanding of how our mind-set can impact our ability to achieve goals and the importance of cultivating a positive and growth-oriented mind-set. The purpose of this lesson is to provide patients with a deeper understanding of how to set and  achieve personal goals, as well as the tools and strategies needed to overcome common obstacles which may arise along the way.  From Head to Heart: The Power of a Healthy Outlook  Clinical staff led group instruction and group discussion with PowerPoint presentation and patient guidebook. To enhance the learning environment the use of posters, models and videos may be added. Patients will be able to recognize and describe the impact of emotions and mood on physical health. They will discover the importance of self-care and explore self-care practices which may work for them. Patients  will also learn how to utilize the 4 C's to cultivate a healthier outlook and better manage stress and challenges. The purpose of this lesson is to demonstrate to patients how a healthy outlook is an essential part of maintaining good health, especially as they continue their cardiac rehab journey.  Healthy Sleep for a Healthy Heart Clinical staff led group instruction and group discussion with PowerPoint presentation and patient guidebook. To enhance the learning environment the use of posters, models and videos may be added. At the conclusion of this workshop, patients will be able to demonstrate knowledge of the importance of sleep to overall health, well-being, and quality of life. They will understand the symptoms of, and treatments for, common sleep disorders. Patients will also be able to identify daytime and nighttime behaviors which impact sleep, and they will be able to apply these tools to help manage sleep-related challenges. The purpose of this lesson is to provide patients with a general overview of sleep and outline the importance of quality sleep. Patients will learn about a few of the most common sleep disorders. Patients will also be introduced to the concept of "sleep hygiene," and discover ways to self-manage certain sleeping problems through simple daily behavior changes. Finally, the workshop will motivate  patients by clarifying the links between quality sleep and their goals of heart-healthy living.   Recognizing and Reducing Stress Clinical staff led group instruction and group discussion with PowerPoint presentation and patient guidebook. To enhance the learning environment the use of posters, models and videos may be added. At the conclusion of this workshop, patients will be able to understand the types of stress reactions, differentiate between acute and chronic stress, and recognize the impact that chronic stress has on their health. They will also be able to apply different coping mechanisms, such as reframing negative self-talk. Patients will have the opportunity to practice a variety of stress management techniques, such as deep abdominal breathing, progressive muscle relaxation, and/or guided imagery.  The purpose of this lesson is to educate patients on the role of stress in their lives and to provide healthy techniques for coping with it.  Learning Barriers/Preferences:  Learning Barriers/Preferences - 04/21/24 1138       Learning Barriers/Preferences   Learning Barriers Sight    Learning Preferences Computer/Internet;Group Instruction;Individual Instruction;Skilled Demonstration;Verbal Instruction;Written Material;Video;Pictoral          Education Topics:  Knowledge Questionnaire Score:  Knowledge Questionnaire Score - 04/21/24 1148       Knowledge Questionnaire Score   Pre Score 22/24          Core Components/Risk Factors/Patient Goals at Admission:  Personal Goals and Risk Factors at Admission - 04/21/24 1139       Core Components/Risk Factors/Patient Goals on Admission    Weight Management Yes;Weight Maintenance    Intervention Weight Management: Develop a combined nutrition and exercise program designed to reach desired caloric intake, while maintaining appropriate intake of nutrient and fiber, sodium and fats, and appropriate energy expenditure required for the  weight goal.;Weight Management: Provide education and appropriate resources to help participant work on and attain dietary goals.    Expected Outcomes Short Term: Continue to assess and modify interventions until short term weight is achieved;Long Term: Adherence to nutrition and physical activity/exercise program aimed toward attainment of established weight goal;Weight Maintenance: Understanding of the daily nutrition guidelines, which includes 25-35% calories from fat, 7% or less cal from saturated fats, less than 200mg  cholesterol, less than 1.5gm of sodium, & 5 or  more servings of fruits and vegetables daily;Understanding recommendations for meals to include 15-35% energy as protein, 25-35% energy from fat, 35-60% energy from carbohydrates, less than 200mg  of dietary cholesterol, 20-35 gm of total fiber daily;Understanding of distribution of calorie intake throughout the day with the consumption of 4-5 meals/snacks    Tobacco Cessation Yes    Number of packs per day 5 cigarettes a day    Intervention Assist the participant in steps to quit. Provide individualized education and counseling about committing to Tobacco Cessation, relapse prevention, and pharmacological support that can be provided by physician.;Education officer, environmental, assist with locating and accessing local/national Quit Smoking programs, and support quit date choice.    Expected Outcomes Short Term: Will demonstrate readiness to quit, by selecting a quit date.;Long Term: Complete abstinence from all tobacco products for at least 12 months from quit date.;Short Term: Will quit all tobacco product use, adhering to prevention of relapse plan.    Diabetes Yes    Intervention Provide education about signs/symptoms and action to take for hypo/hyperglycemia.;Provide education about proper nutrition, including hydration, and aerobic/resistive exercise prescription along with prescribed medications to achieve blood glucose in normal ranges:  Fasting glucose 65-99 mg/dL    Expected Outcomes Short Term: Participant verbalizes understanding of the signs/symptoms and immediate care of hyper/hypoglycemia, proper foot care and importance of medication, aerobic/resistive exercise and nutrition plan for blood glucose control.;Long Term: Attainment of HbA1C < 7%.    Hypertension Yes    Intervention Provide education on lifestyle modifcations including regular physical activity/exercise, weight management, moderate sodium restriction and increased consumption of fresh fruit, vegetables, and low fat dairy, alcohol moderation, and smoking cessation.;Monitor prescription use compliance.    Expected Outcomes Long Term: Maintenance of blood pressure at goal levels.;Short Term: Continued assessment and intervention until BP is < 140/16mm HG in hypertensive participants. < 130/74mm HG in hypertensive participants with diabetes, heart failure or chronic kidney disease.    Lipids Yes    Intervention Provide education and support for participant on nutrition & aerobic/resistive exercise along with prescribed medications to achieve LDL 70mg , HDL >40mg .    Expected Outcomes Short Term: Participant states understanding of desired cholesterol values and is compliant with medications prescribed. Participant is following exercise prescription and nutrition guidelines.;Long Term: Cholesterol controlled with medications as prescribed, with individualized exercise RX and with personalized nutrition plan. Value goals: LDL < 70mg , HDL > 40 mg.          Core Components/Risk Factors/Patient Goals Review:   Goals and Risk Factor Review     Row Name 05/05/24 0841 05/11/24 1513 06/15/24 1130         Core Components/Risk Factors/Patient Goals Review   Personal Goals Review Weight Management/Obesity;Hypertension;Lipids;Tobacco Cessation Weight Management/Obesity;Hypertension;Lipids;Tobacco Cessation Weight Management/Obesity;Hypertension;Lipids;Tobacco Cessation      Review Armstrong started cardiac rehab on 05/03/24. Garlan is off to a good start to exercise. Some systolic blood pressures noted in the 90's asymptomatic. Will continue to monitor BP's. Olan started cardiac rehab on 05/03/24. Vue is off to a good start to exercise. Some systolic blood pressures noted in the 90's asymptomatic. Will continue to monitor BP's. [name] PAC notifed about BP's. Iris is doing well with exercise at cardiac rehab. Vital signs have been stable. Heyward has increased his met levels. Will continue to enourage smoking cessation.     Expected Outcomes Jamarrion will continue to participate in cardiac rehab for exercise, nutriiton and lifestyle modifications Kristy will continue to participate in cardiac rehab for exercise, nutriiton  and lifestyle modifications Yaiden will continue to participate in cardiac rehab for exercise, nutriiton and lifestyle modifications        Core Components/Risk Factors/Patient Goals at Discharge (Final Review):   Goals and Risk Factor Review - 06/15/24 1130       Core Components/Risk Factors/Patient Goals Review   Personal Goals Review Weight Management/Obesity;Hypertension;Lipids;Tobacco Cessation    Review Tilden is doing well with exercise at cardiac rehab. Vital signs have been stable. Ishaan has increased his met levels. Will continue to enourage smoking cessation.    Expected Outcomes Americo will continue to participate in cardiac rehab for exercise, nutriiton and lifestyle modifications          ITP Comments:  ITP Comments     Row Name 04/21/24 1131 05/05/24 0826 05/11/24 1512 06/15/24 1125     ITP Comments Dr. Wilbert Bihari medical director. Introduction to pritikin education/intensive cardiac rehab. Initial orientation packet reviewed with patient. 30 Day ITP Review. Issak started cardiac rehab on 04/30/24. Zaim did well with exercise. 30 Day ITP Review. Antwone started cardiac rehab on 04/30/24. Camdin is off to a good start  with exercise. 30 Day ITP Review. Shamar has good attendance and participation with exercise at cardiac rehab       Comments: See ITP comments.[name] Elpidio Quan RN BSN

## 2024-06-16 ENCOUNTER — Encounter (HOSPITAL_COMMUNITY)
Admission: RE | Admit: 2024-06-16 | Discharge: 2024-06-16 | Disposition: A | Source: Ambulatory Visit | Attending: Cardiology

## 2024-06-16 DIAGNOSIS — Z951 Presence of aortocoronary bypass graft: Secondary | ICD-10-CM | POA: Diagnosis not present

## 2024-06-18 ENCOUNTER — Encounter (HOSPITAL_COMMUNITY)
Admission: RE | Admit: 2024-06-18 | Discharge: 2024-06-18 | Disposition: A | Source: Ambulatory Visit | Attending: Cardiology

## 2024-06-18 DIAGNOSIS — Z951 Presence of aortocoronary bypass graft: Secondary | ICD-10-CM

## 2024-06-21 ENCOUNTER — Encounter (HOSPITAL_COMMUNITY)
Admission: RE | Admit: 2024-06-21 | Discharge: 2024-06-21 | Disposition: A | Source: Ambulatory Visit | Attending: Cardiology

## 2024-06-21 DIAGNOSIS — Z951 Presence of aortocoronary bypass graft: Secondary | ICD-10-CM | POA: Diagnosis not present

## 2024-06-23 ENCOUNTER — Encounter (HOSPITAL_COMMUNITY)
Admission: RE | Admit: 2024-06-23 | Discharge: 2024-06-23 | Disposition: A | Discharge status: Home / Self Care | Source: Ambulatory Visit | Attending: Cardiology | Admitting: Cardiology

## 2024-06-23 DIAGNOSIS — Z951 Presence of aortocoronary bypass graft: Secondary | ICD-10-CM | POA: Diagnosis not present

## 2024-06-25 ENCOUNTER — Encounter (HOSPITAL_COMMUNITY)
Admission: RE | Admit: 2024-06-25 | Discharge: 2024-06-25 | Disposition: A | Source: Ambulatory Visit | Attending: Cardiology

## 2024-06-25 DIAGNOSIS — Z951 Presence of aortocoronary bypass graft: Secondary | ICD-10-CM

## 2024-06-28 ENCOUNTER — Encounter (HOSPITAL_COMMUNITY)
Admission: RE | Admit: 2024-06-28 | Discharge: 2024-06-28 | Disposition: A | Source: Ambulatory Visit | Attending: Cardiology

## 2024-06-28 DIAGNOSIS — Z951 Presence of aortocoronary bypass graft: Secondary | ICD-10-CM | POA: Diagnosis not present

## 2024-06-30 ENCOUNTER — Encounter (HOSPITAL_COMMUNITY): Admission: RE | Admit: 2024-06-30 | Discharge: 2024-06-30 | Attending: Cardiology

## 2024-06-30 DIAGNOSIS — Z951 Presence of aortocoronary bypass graft: Secondary | ICD-10-CM | POA: Diagnosis not present

## 2024-07-02 ENCOUNTER — Encounter (HOSPITAL_COMMUNITY): Admission: RE | Admit: 2024-07-02 | Discharge: 2024-07-02 | Attending: Cardiology

## 2024-07-02 DIAGNOSIS — Z951 Presence of aortocoronary bypass graft: Secondary | ICD-10-CM

## 2024-07-05 ENCOUNTER — Telehealth (HOSPITAL_COMMUNITY): Payer: Self-pay

## 2024-07-05 ENCOUNTER — Encounter (HOSPITAL_COMMUNITY)

## 2024-07-05 DIAGNOSIS — Z951 Presence of aortocoronary bypass graft: Secondary | ICD-10-CM | POA: Diagnosis not present

## 2024-07-05 NOTE — Telephone Encounter (Signed)
 Pt called wanted to let us  know his insurance is changing to [employer] for next year 2026 and asked to see how much would he be paying if he continues his cardiac rehab into next year. I adv pt that we would not be able to let him know of his benefits for next year but worst case scenario would be paying out of pocket between $250-$300. I advised pt that since he only would have 1 session into 2026 that he could grad early on 12/31. Pt stated that he really wanted to finish the program out and that he would call back with a definite answer.

## 2024-07-06 ENCOUNTER — Telehealth (HOSPITAL_COMMUNITY): Payer: Self-pay

## 2024-07-06 NOTE — Telephone Encounter (Signed)
 Pt called and wanted to advised that he wanted to grad cardiac rehab on 12/31 before the new year started.

## 2024-07-07 ENCOUNTER — Encounter (HOSPITAL_COMMUNITY)

## 2024-07-07 ENCOUNTER — Encounter (HOSPITAL_COMMUNITY)
Admission: RE | Admit: 2024-07-07 | Discharge: 2024-07-07 | Disposition: A | Source: Ambulatory Visit | Attending: Cardiology

## 2024-07-07 DIAGNOSIS — Z951 Presence of aortocoronary bypass graft: Secondary | ICD-10-CM

## 2024-07-08 ENCOUNTER — Emergency Department (HOSPITAL_COMMUNITY)

## 2024-07-08 ENCOUNTER — Emergency Department (HOSPITAL_COMMUNITY): Admission: EM | Admit: 2024-07-08 | Discharge: 2024-07-08 | Disposition: A | Discharge status: Home / Self Care

## 2024-07-08 ENCOUNTER — Encounter (HOSPITAL_COMMUNITY): Payer: Self-pay

## 2024-07-08 ENCOUNTER — Other Ambulatory Visit: Payer: Self-pay

## 2024-07-08 DIAGNOSIS — Z7982 Long term (current) use of aspirin: Secondary | ICD-10-CM | POA: Insufficient documentation

## 2024-07-08 DIAGNOSIS — R079 Chest pain, unspecified: Secondary | ICD-10-CM | POA: Diagnosis present

## 2024-07-08 DIAGNOSIS — Z951 Presence of aortocoronary bypass graft: Secondary | ICD-10-CM | POA: Insufficient documentation

## 2024-07-08 DIAGNOSIS — Z91040 Latex allergy status: Secondary | ICD-10-CM | POA: Diagnosis not present

## 2024-07-08 LAB — COMPREHENSIVE METABOLIC PANEL WITH GFR
ALT: 22 U/L (ref 0–44)
AST: 25 U/L (ref 15–41)
Albumin: 4.2 g/dL (ref 3.5–5.0)
Alkaline Phosphatase: 85 U/L (ref 38–126)
Anion gap: 9 (ref 5–15)
BUN: 25 mg/dL — ABNORMAL HIGH (ref 6–20)
CO2: 24 mmol/L (ref 22–32)
Calcium: 9 mg/dL (ref 8.9–10.3)
Chloride: 104 mmol/L (ref 98–111)
Creatinine, Ser: 1.02 mg/dL (ref 0.61–1.24)
GFR, Estimated: >60 mL/min
Glucose, Bld: 127 mg/dL — ABNORMAL HIGH (ref 70–99)
Potassium: 4.5 mmol/L (ref 3.5–5.1)
Sodium: 137 mmol/L (ref 135–145)
Total Bilirubin: 0.4 mg/dL (ref 0.0–1.2)
Total Protein: 6.7 g/dL (ref 6.5–8.1)

## 2024-07-08 LAB — TROPONIN T, HIGH SENSITIVITY
Troponin T High Sensitivity: <15 ng/L (ref 0–19)
Troponin T High Sensitivity: <15 ng/L (ref 0–19)

## 2024-07-08 LAB — CBC
HCT: 43.6 % (ref 39.0–52.0)
Hemoglobin: 14.8 g/dL (ref 13.0–17.0)
MCH: 30.1 pg (ref 26.0–34.0)
MCHC: 33.9 g/dL (ref 30.0–36.0)
MCV: 88.6 fL (ref 80.0–100.0)
Platelets: 260 K/uL (ref 150–400)
RBC: 4.92 MIL/uL (ref 4.22–5.81)
RDW: 14.9 % (ref 11.5–15.5)
WBC: 6.4 K/uL (ref 4.0–10.5)
nRBC: 0 % (ref 0.0–0.2)

## 2024-07-08 MED ORDER — NITROGLYCERIN 0.4 MG SL SUBL
0.4000 mg | SUBLINGUAL_TABLET | Freq: Once | SUBLINGUAL | Status: AC
  Administered 2024-07-08: 0.4 mg via SUBLINGUAL
  Filled 2024-07-08: qty 1

## 2024-07-08 NOTE — Discharge Instructions (Signed)
 Please follow-up with your cardiologist.  If you have another episode of chest pain I have a low threshold to return to the emergency room however you can always take a dose of nitroglycerin  if your symptoms improve completely you can continue to monitor your symptoms at home and touch base with your cardiologist.

## 2024-07-08 NOTE — ED Triage Notes (Signed)
 Pt to er, pt states that he is here for chest pain starting Friday when he was working out at cardiac rehab, states that it has been uncomfortable and would slightly go away, states that yesterday morning the pain started getting worse, states that his pain doesn't get worse with deep breathing, pt states that he also has some sob with exertion.  Denies increases in pain with palpation.  States that it feels similar when he has fluid in his lungs.

## 2024-07-08 NOTE — ED Provider Notes (Signed)
 " [place] EMERGENCY DEPARTMENT AT [facility] Provider Note   CSN: [id number] Arrival date & time: 07/08/24  9165     Patient presents with: Chest Pain   Phillip Clark is a 58 y.o. male.    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  Some nausea and mild shortness of breath.  No vomiting.  No abdominal pain fevers lightheadedness or dizziness.  He was given 1 dose of nitroglycerin  by me and had complete resolution of his symptoms.      Prior to Admission medications  Medication Sig Start Date End Date Taking? Authorizing Provider  acetaminophen  (TYLENOL ) 325 MG tablet Take 2 tablets (650 mg total) by mouth every 6 (six) hours as needed. 02/02/24   Raguel Con RAMAN, PA-C  aspirin  EC 325 MG tablet Take 1 tablet (325 mg total) by mouth daily. 02/02/24   Raguel Con RAMAN, PA-C  atorvastatin  (LIPITOR) 40 MG tablet Take 1 tablet (40 mg total) by mouth at bedtime. 03/17/24   Lelon Hamilton T, PA-C  colchicine  0.6 MG tablet Take 1 tablet (0.6 mg total) by mouth 2 (two) times daily. 04/23/24   Griselda Norris, MD  colchicine  0.6 MG tablet Take 1 tablet (0.6 mg total) by mouth 2 (two) times daily. 04/29/24   Rutha Manuelita HERO, PA-C  ezetimibe  (ZETIA ) 10 MG tablet Take 1 tablet (10 mg total) by mouth daily. 03/17/24   Lelon Hamilton T, PA-C  JARDIANCE  25 MG TABS tablet Take 25 mg by mouth daily. 08/01/23   [provider]  magnesium  oxide (MAG-OX) 400 MG tablet Take 400 mg by mouth daily.    [provider]  metoprolol  tartrate (LOPRESSOR ) 25 MG tablet Take 1 tablet (25 mg total) by mouth 2 (two) times daily. 03/17/24   Lelon Hamilton T, PA-C  Multiple Vitamin (MULTIVITAMIN ADULT PO) Take 1 tablet by mouth daily.    [provider]  Omega-3 Fatty Acids (FISH OIL) 1000 MG CAPS Take 1,000 mg by mouth daily.     [provider]    Allergies: Latex, Crestor [rosuvastatin calcium ], Rosuvastatin, Other, Topiramate, Varenicline, and Tape    Review of Systems  Cardiovascular:  Positive for chest pain.    Updated Vital Signs BP 110/78   Pulse 70   Temp (!) 97.4 F (36.3 C)   Resp 16   Ht 5' 10 (1.778 m)   Wt 88 kg   SpO2 98%   BMI 27.84 kg/m   Physical Exam Vitals and nursing note reviewed.  Constitutional:      General: He is not in acute distress. HENT:     Head: Normocephalic and atraumatic.     Nose: Nose normal.  Eyes:     General: No scleral icterus. Cardiovascular:     Rate and Rhythm: Normal rate and regular rhythm.     Pulses: Normal pulses.     Heart sounds: Normal heart sounds.  Pulmonary:     Effort: Pulmonary effort is normal. No respiratory distress.     Breath sounds: No wheezing.  Abdominal:     Palpations: Abdomen is soft.     Tenderness: There is no abdominal tenderness.  Musculoskeletal:     Cervical back: Normal range of motion.     Right lower leg: No edema.     Left lower leg: No edema.  Skin:  General: Skin is warm and dry.     Capillary Refill: Capillary refill takes less than 2 seconds.  Neurological:     Mental Status: He is alert. Mental status is at baseline.  Psychiatric:        Mood and Affect: Mood normal.        Behavior: Behavior normal.     (all labs ordered are listed, but only abnormal results are displayed) Labs Reviewed  COMPREHENSIVE METABOLIC PANEL WITH GFR - Abnormal; Notable for the following components:      Result Value   Glucose, Bld 127 (*)    BUN 25 (*)    All other components within normal limits  CBC  TROPONIN T, HIGH SENSITIVITY  TROPONIN T, HIGH SENSITIVITY    EKG: EKG Interpretation Date/Time:  Thursday July 08 2024 08:44:10 EST Ventricular Rate:  71 PR Interval:  170 QRS Duration:  103 QT Interval:  362 QTC Calculation: 394 R Axis:   77  Text Interpretation: Sinus rhythm Ventricular  trigeminy Probable left atrial enlargement RSR' in V1 or V2, right VCD or RVH Confirmed by Neysa Clap [phone]) on 07/08/2024 11:48:20 AM  Radiology: [facility] Chest 2 View Result Date: 07/08/2024 CLINICAL DATA:  Chest pain. EXAM: CHEST - 2 VIEW COMPARISON:  04/29/2024 and CT chest 04/23/2024. FINDINGS: Trachea is midline. Heart size normal. Lungs are clear. No pleural fluid. Median sternotomy. Degenerative changes in the spine. IMPRESSION: No acute findings. Electronically Signed   By: Newell Eke M.D.   On: 07/08/2024 09:49     Procedures   Medications Ordered in the ED  nitroGLYCERIN  (NITROSTAT ) SL tablet 0.4 mg (0.4 mg Sublingual Given 07/08/24 0905)    Clinical Course as of 07/08/24 1308  Thu Jul 08, 2024  1224 Discussed with Dr. Alvan of cardiology. [WF]    Clinical Course User Index [WF] Neldon Hamp RAMAN, PA                                 Medical Decision Making Amount and/or Complexity of Data Reviewed Labs: ordered. Radiology: ordered.  Risk Prescription drug management.   This patient presents to the ED for concern of CP, this involves a number of treatment options, and is a complaint that carries with it a high risk of complications and morbidity. A differential diagnosis was considered for the patient's symptoms which is discussed below:   Unstable angina/ACS.  MSK pain from lifting boxes.  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  Some nausea and mild shortness of breath.  No vomiting.  No abdominal pain fevers lightheadedness or dizziness.  He was given 1 dose of nitroglycerin  by me and had complete resolution of his symptoms.     EMR reviewed including pt PMHx, past surgical history and past visits to ER.   See HPI for more details   Lab  Tests:   I personally reviewed all laboratory work and imaging. Metabolic panel without any acute abnormality specifically kidney function within normal limits and no significant electrolyte abnormalities. CBC without leukocytosis or significant anemia.   Imaging Studies:  NAD. I personally reviewed all imaging studies and no acute abnormality found. I agree with radiology interpretation.  Last cath was 01/01/24   Mid LAD  lesion is 30% stenosed.   Prox LAD lesion is 20% stenosed.   Prox Cx lesion is 30% stenosed.   Prox RCA lesion is 20% stenosed.   Dist RCA lesion is 20% stenosed.   1st Mrg lesion is 30% stenosed.   Mid RCA lesion is 100% stenosed.   Post intervention, there is a 100% residual stenosis.   The radiation dose exceeded thresholds defined in the Patient Radiation Dose Management For Interventional Medical Procedures With Extensive Use of Fluoroscopy policy. Specific follow up instructions will be provided to the patient prior to discharge.   on 01/02/2024.  Cardiac Monitoring:  The patient was maintained on a cardiac monitor.  I personally viewed and interpreted the cardiac monitored which showed an underlying rhythm of: NSR EKG non-ischemic   Medicines ordered:  I ordered medication including nitroglycerin  for chest pain Reevaluation of the patient after these medicines showed that the patient resolved I have reviewed the patients home medicines and have made adjustments as needed   Critical Interventions:     Consults/Attending Physician   I requested consultation with Dr. Alvan of cardiology,  and discussed lab and imaging findings as well as pertinent plan - they recommend: After reviewing labs EKG and hearing history and physical exam today recommend outpatient follow-up.  Return precautions.  Patient is chest pain-free I think this is very reasonable.  Patient agreeable to plan.   Reevaluation:  After the interventions noted above I re-evaluated  patient and found that they have :resolved   Social Determinants of Health:      Problem List / ED Course:  Patient with sternal nonradiating nonexertional chest pain that has been going on consistently since last night although some pain intermittently since Friday who experienced complete resolution of symptoms with 1 dose of nitroglycerin .  I discussed with cardiology since he has no ongoing chest pain and normal troponins x 2 if he would be reasonable for discharge home and they think that he is will discharge home patient with very strict return precautions and will use nitroglycerin  if needed.   Dispostion:  After consideration of the diagnostic results and the patients response to treatment, I feel that the patent would benefit from outpatient follow-up.   Final diagnoses:  Nonspecific chest pain    ED Discharge Orders     None          Neldon Hamp RAMAN, [place] 07/08/24 1309    Neysa Caron PARAS, [place] 07/08/24 1551  "

## 2024-07-08 NOTE — ED Notes (Signed)
 Patient transported to X-ray

## 2024-07-09 ENCOUNTER — Encounter (HOSPITAL_COMMUNITY)

## 2024-07-10 NOTE — Progress Notes (Signed)
 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  Patient's Home Medications on Admission: Current Medications[1]  Past Medical History: Past Medical History:  Diagnosis Date   Coronary artery calcification of native artery    Diabetes mellitus without complication (HCC)    Essential tremor    Hyperlipidemia    Nonobstructive atherosclerosis of coronary artery    Pneumonia    as a child    Tobacco Use: Tobacco Use History[2]  Labs: Review Flowsheet  More data exists      Latest Ref Rng & Units 07/23/2022 09/05/2022 01/28/2024 01/29/2024 01/30/2024  Labs for ITP Cardiac and Pulmonary Rehab  Cholestrol 0 - 200 mg/dL 797  868  - - 62   LDL (calc) 0 - 99 mg/dL 848  74  - - 24   Direct LDL 0 - 99 mg/dL 847  72  - - -  HDL-C >59 mg/dL 35  37  - - 29   Trlycerides <150 mg/dL 86  891  - - 45   Hemoglobin A1c 4.8 - 5.6 % - - 5.5  - -  PH, Arterial 7.35 - 7.45 - - - 7.289  7.379  -  PCO2 arterial 32 - 48 mmHg - - - 40.0  30.1  -  Bicarbonate 20.0 - 28.0 mmol/L - - - 19.0  18.3  -  TCO2 22 - 32 mmol/L - - - 20  19  21  20  24   -  Acid-base deficit 0.0 - 2.0 mmol/L - - - 7.0  7.0  -  O2 Saturation % - - - 91  97  -    Details       Multiple values from one day are sorted in reverse-chronological order         Capillary Blood Glucose: Lab Results  Component Value Date   GLUCAP 83 05/03/2024   GLUCAP 108 (H) 05/03/2024   GLUCAP 99 04/30/2024   GLUCAP 117 (H) 04/30/2024   GLUCAP 140 (H) 02/02/2024      Exercise Target Goals: Exercise Program Goal: Individual exercise prescription set using results from initial 6 min walk test and THRR while considering  patients activity barriers and safety.   Exercise Prescription Goal: Initial exercise prescription builds to 30-45 minutes a day of aerobic activity, 2-3 days per week.  Home exercise guidelines will be given to patient during program as part of exercise prescription that the participant will acknowledge.  Activity Barriers & Risk Stratification:  Activity Barriers & Cardiac Risk Stratification - 04/21/24 1137       Activity Barriers & Cardiac Risk Stratification   Activity Barriers Right Hip Replacement;Incisional Pain    Cardiac Risk Stratification High   <5 METs on         6 Minute Walk:  6 Minute Walk     Row Name 04/21/24 1322         6 Minute Walk   Phase Initial     Distance 1500 feet     Walk Time 6 minutes     #  of Rest Breaks 0     MPH 2.84     METS 4.1     RPE 11     Perceived Dyspnea  0     VO2 Peak 14.27     Symptoms No     Resting HR 73 bpm     Resting BP 102/68     Resting Oxygen Saturation  97 %     Exercise Oxygen Saturation  during 6 min walk 97 %     Max Ex. HR 90 bpm     Max Ex. BP 130/70     2 Minute Post BP 112/70        Oxygen Initial Assessment:   Oxygen Re-Evaluation:   Oxygen Discharge (Final Oxygen Re-Evaluation):   Initial Exercise Prescription:  Initial Exercise Prescription - 04/21/24 1100       Date of Initial Exercise RX and Referring Provider   Date 04/21/24    Referring Provider Peter Jordan, MD    Expected Discharge Date 07/16/24      Recumbant Bike   Level 2    RPM 50    Watts 30    Minutes 15    METs 2.5      NuStep   Level 2    SPM 75    Minutes 15    METs 2.5      Prescription Details   Frequency (times per week) 3    Duration Progress to 30 minutes of continuous aerobic without signs/symptoms of physical distress      Intensity    THRR 40-80% of Max Heartrate 66-132    Ratings of Perceived Exertion 11-13    Perceived Dyspnea 0-4      Progression   Progression Continue progressive overload as per policy without signs/symptoms or physical distress.      Resistance Training   Training Prescription Yes    Weight 3    Reps 10-15          Perform Capillary Blood Glucose checks as needed.  Exercise Prescription Changes:   Exercise Prescription Changes     Row Name 04/30/24 1100 05/12/24 1030 05/28/24 1100 06/14/24 1600 06/30/24 1100     Response to Exercise   Blood Pressure (Admit) 102/64 108/72 106/58 114/66 101/60   Blood Pressure (Exercise) 116/76 120/76 -- -- --   Blood Pressure (Exit) 106/70 116/66 106/60 102/66 98/70   Heart Rate (Admit) 62 bpm 84 bpm 76 bpm 65 bpm 71 bpm   Heart Rate (Exercise) 90 bpm 98 bpm 103 bpm 90 bpm 105 bpm   Heart Rate (Exit) 69 bpm 80 bpm 76 bpm 66 bpm 70 bpm   Rating of Perceived Exertion (Exercise) 10 14 14 12 11    Symptoms None None None None None   Comments Pt's first day in the CRP2 program Reviewed METs Reviewed METs, goals, and home exercise Rx Reviewed METs Reviewed METs and goals   Duration Continue with 30 min of aerobic exercise without signs/symptoms of physical distress. Continue with 30 min of aerobic exercise without signs/symptoms of physical distress. Continue with 30 min of aerobic exercise without signs/symptoms of physical distress. Continue with 30 min of aerobic exercise without signs/symptoms of physical distress. Continue with 30 min of aerobic exercise without signs/symptoms of physical distress.   Intensity THRR unchanged THRR unchanged THRR unchanged THRR unchanged THRR unchanged     Progression   Progression Continue to progress workloads to maintain intensity without signs/symptoms of physical distress. Continue to progress workloads to  maintain intensity without signs/symptoms of physical distress. Continue to progress workloads to maintain intensity  without signs/symptoms of physical distress. Continue to progress workloads to maintain intensity without signs/symptoms of physical distress. Continue to progress workloads to maintain intensity without signs/symptoms of physical distress.   Average METs 2.2 3 3.55 3.45 4.1     Resistance Training   Training Prescription Yes No Yes Yes --   Weight 3 No wts on wednesdays 3 lbs 3 lbs No wts on wednesdays   Reps 10-15 -- 10-15 10-15 --   Time 5 Minutes -- 5 Minutes 5 Minutes --     Interval Training   Interval Training No No No No No     Recumbant Bike   Level 2 2 2 3 3    RPM 59 85 100 81 54   Watts 20 28 57 52 59   Minutes 15 15 15 15 5    METs 2.3 3.2 3.9 3.6 3.8     NuStep   Level 2 2 2 2 2    SPM 77 107 132 125 110   Minutes 15 15 15 15 15    METs 2.1 2.8 3.2 3.3 4.4     Home Exercise Plan   Plans to continue exercise at -- -- Home (comment) Home (comment) Home (comment)   Frequency -- -- Add 3 additional days to program exercise sessions. Add 3 additional days to program exercise sessions. Add 3 additional days to program exercise sessions.   Initial Home Exercises Provided -- -- 05/28/24 05/28/24 05/28/24      Exercise Comments:   Exercise Comments     Row Name 04/30/24 1121 05/12/24 1030 05/28/24 0823 06/14/24 1659 06/30/24 1130   Exercise Comments Pt's first day in the CRP2 program. Pt exercised without compliants and tolerated session well. Reviewed METs, pt is making progress. RPE's are currently at 12-14 on modalites, no increases at this time. Reviewed goals, METs and home exercise Rx. Pt is progressing, peak METs have increased to 2.3 to 3.6 and average METs form 2.2 to 3.2. Pt verbaalized understnading of the home exericse Rx and was provided a copy. Reviewed METs. Has been progressing on METs. MET level dropped on bikr today with increased workload level. Encouraged to keep RPM a previous pace to improve MET level. Reviewed METs and goals. Making good progress. Average  METs have increased. Will try Elliptical next session. Pt is considering buying one for home use.      Exercise Goals and Review:   Exercise Goals     Row Name 04/21/24 1138             Exercise Goals   Increase Physical Activity Yes       Intervention Provide advice, education, support and counseling about physical activity/exercise needs.;Develop an individualized exercise prescription for aerobic and resistive training based on initial evaluation findings, risk stratification, comorbidities and participant's personal goals.       Expected Outcomes Short Term: Attend rehab on a regular basis to increase amount of physical activity.;Long Term: Exercising regularly at least 3-5 days a week.;Long Term: Add in home exercise to make exercise part of routine and to increase amount of physical activity.       Increase Strength and Stamina Yes       Intervention Develop an individualized exercise prescription for aerobic and resistive training based on initial evaluation findings, risk stratification, comorbidities and participant's personal goals.;Provide advice, education, support and counseling about physical activity/exercise needs.  Expected Outcomes Long Term: Improve cardiorespiratory fitness, muscular endurance and strength as measured by increased METs and functional capacity ( );Short Term: Perform resistance training exercises routinely during rehab and add in resistance training at home;Short Term: Increase workloads from initial exercise prescription for resistance, speed, and METs.       Able to understand and use rate of perceived exertion (RPE) scale Yes       Intervention Provide education and explanation on how to use RPE scale       Expected Outcomes Long Term:  Able to use RPE to guide intensity level when exercising independently;Short Term: Able to use RPE daily in rehab to express subjective intensity level       Knowledge and understanding of Target Heart Rate Range  (THRR) Yes       Intervention Provide education and explanation of THRR including how the numbers were predicted and where they are located for reference       Expected Outcomes Long Term: Able to use THRR to govern intensity when exercising independently;Short Term: Able to state/look up THRR;Short Term: Able to use daily as guideline for intensity in rehab       Understanding of Exercise Prescription Yes       Intervention Provide education, explanation, and written materials on patient's individual exercise prescription       Expected Outcomes Short Term: Able to explain program exercise prescription;Long Term: Able to explain home exercise prescription to exercise independently          Exercise Goals Re-Evaluation :  Exercise Goals Re-Evaluation     Row Name 04/30/24 1120 05/28/24 0800 06/30/24 1127         Exercise Goal Re-Evaluation   Exercise Goals Review Increase Physical Activity;Understanding of Exercise Prescription;Increase Strength and Stamina;Knowledge and understanding of Target Heart Rate Range (THRR);Able to understand and use rate of perceived exertion (RPE) scale Increase Physical Activity;Understanding of Exercise Prescription;Increase Strength and Stamina;Knowledge and understanding of Target Heart Rate Range (THRR);Able to understand and use rate of perceived exertion (RPE) scale Increase Physical Activity;Understanding of Exercise Prescription;Increase Strength and Stamina;Knowledge and understanding of Target Heart Rate Range (THRR);Able to understand and use rate of perceived exertion (RPE) scale     Comments Pt's first day in the CRP2 program. Pt understands the exercise Rx, RPE sclae and THRR. Reviewed METs, goals and home exercise Rx. Pt is making good progress on his goals of imrpoved strength and stamina. Pt voices he is doing more at home and feeling stronger. Pt has been doing some walking at for about 15-20 minutes. Pt will increase that to 30 minutes 2-3x/week.  Reviewed METs and goals. Pt continues to voice good progress on his goals of imrpoved strength and stamina. Pt voices he feels like his is getting back to normal.     Expected Outcomes Will continue to monitor the patient and progress exercise workloads as tolerated. Will continue to monitor the patient and progress exercise workloads as tolerated. Will continue to monitor the patient and progress exercise workloads as tolerated.        Discharge Exercise Prescription (Final Exercise Prescription Changes):  Exercise Prescription Changes - 06/30/24 1100       Response to Exercise   Blood Pressure (Admit) 101/60    Blood Pressure (Exit) 98/70    Heart Rate (Admit) 71 bpm    Heart Rate (Exercise) 105 bpm    Heart Rate (Exit) 70 bpm    Rating of Perceived Exertion (Exercise) 11  Symptoms None    Comments Reviewed METs and goals    Duration Continue with 30 min of aerobic exercise without signs/symptoms of physical distress.    Intensity THRR unchanged      Progression   Progression Continue to progress workloads to maintain intensity without signs/symptoms of physical distress.    Average METs 4.1      Resistance Training   Weight No wts on wednesdays      Interval Training   Interval Training No      Recumbant Bike   Level 3    RPM 54    Watts 59    Minutes 5    METs 3.8      NuStep   Level 2    SPM 110    Minutes 15    METs 4.4      Home Exercise Plan   Plans to continue exercise at Home (comment)    Frequency Add 3 additional days to program exercise sessions.    Initial Home Exercises Provided 05/28/24          Nutrition:  Target Goals: Understanding of nutrition guidelines, daily intake of sodium 1500mg , cholesterol 200mg , calories 30% from fat and 7% or less from saturated fats, daily to have 5 or more servings of fruits and vegetables.  Biometrics:  Pre Biometrics - 04/21/24 1132       Pre Biometrics   Waist Circumference 39.25 inches    Hip  Circumference 41 inches    Waist to Hip Ratio 0.96 %    Triceps Skinfold 17 mm    % Body Fat 26.4 %    Grip Strength 33 kg    Flexibility 14 in    Single Leg Stand 30 seconds           Nutrition Therapy Plan and Nutrition Goals:   Nutrition Assessments:  MEDIFICTS Score Key: >=70 Need to make dietary changes  40-70 Heart Healthy Diet <= 40 Therapeutic Level Cholesterol Diet   Flowsheet Row INTENSIVE CARDIAC REHAB from 05/05/2024 in [facility]  Picture Your Plate Total Score on Admission 70   Picture Your Plate Scores: <59 Unhealthy dietary pattern with much room for improvement. 41-50 Dietary pattern unlikely to meet recommendations for good health and room for improvement. 51-60 More healthful dietary pattern, with some room for improvement.  >60 Healthy dietary pattern, although there may be some specific behaviors that could be improved.    Nutrition Goals Re-Evaluation:   Nutrition Goals Re-Evaluation:   Nutrition Goals Discharge (Final Nutrition Goals Re-Evaluation):   Psychosocial: Target Goals: Acknowledge presence or absence of significant depression and/or stress, maximize coping skills, provide positive support system. Participant is able to verbalize types and ability to use techniques and skills needed for reducing stress and depression.  Initial Review & Psychosocial Screening:  Initial Psych Review & Screening - 04/21/24 1138       Initial Review   Current issues with None Identified      Family Dynamics   Good Support System? Yes   wife     Barriers   Psychosocial barriers to participate in program There are no identifiable barriers or psychosocial needs.      Screening Interventions   Interventions Encouraged to exercise;Provide feedback about the scores to participant    Expected Outcomes Long Term goal: The participant improves quality of Life and PHQ9 Scores as seen by post  scores and/or verbalization of changes;Short Term goal:  Identification and review with participant of any Quality of Life or Depression concerns found by scoring the questionnaire.          Quality of Life Scores:  Quality of Life - 04/21/24 1323       Quality of Life   Select Quality of Life      Quality of Life Scores   Health/Function Pre 20.63 %    Socioeconomic Pre 26.21 %    Psych/Spiritual Pre 24.5 %    Family Pre 26 %    GLOBAL Pre 23.2 %         Scores of 19 and below usually indicate a poorer quality of life in these areas.  A difference of  2-3 points is a clinically meaningful difference.  A difference of 2-3 points in the total score of the Quality of Life Index has been associated with significant improvement in overall quality of life, self-image, physical symptoms, and general health in studies assessing change in quality of life.  PHQ-9: Review Flowsheet       04/21/2024  Depression screen PHQ 2/9  Decreased Interest 0  Down, Depressed, Hopeless 0  PHQ - 2 Score 0  Altered sleeping 0  Tired, decreased energy 1  Change in appetite 0  Feeling bad or failure about yourself  0  Trouble concentrating 0  Moving slowly or fidgety/restless 1  Suicidal thoughts 0  PHQ-9 Score 2   Difficult doing work/chores Not difficult at all    Details       Data saved with a previous flowsheet row definition        Interpretation of Total Score  Total Score Depression Severity:  1-4 = Minimal depression, 5-9 = Mild depression, 10-14 = Moderate depression, 15-19 = Moderately severe depression, 20-27 = Severe depression   Psychosocial Evaluation and Intervention:   Psychosocial Re-Evaluation:  Psychosocial Re-Evaluation     Row Name 05/05/24 0839 06/15/24 1127 07/10/24 1215         Psychosocial Re-Evaluation   Current issues with None Identified None Identified None Identified     Comments Phillip Clark says he has health insurance now and hopes to return to work  at the end of the year. 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 Phillip Clark continues not to voice any increased concerns or stressors during exercise at cardiac rehab. 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 Phillip Clark will tenatively complete cardiac rehab on 07/14/24.     Continue Psychosocial Services  No Follow up required No Follow up required No Follow up required        Psychosocial Discharge (Final Psychosocial Re-Evaluation):  Psychosocial Re-Evaluation - 07/10/24 1215       Psychosocial Re-Evaluation   Current issues with None Identified    Comments Phillip Clark continues not to voice any increased concerns or stressors during exercise at cardiac rehab. Phillip Clark will tenatively complete cardiac rehab on 07/14/24.    Continue Psychosocial Services  No Follow up required          Vocational Rehabilitation: Provide vocational rehab assistance to qualifying candidates.   Vocational Rehab Evaluation & Intervention:  Vocational Rehab - 04/21/24 1138       Initial Vocational Rehab Evaluation & Intervention   Assessment shows need for Vocational Rehabilitation No   returning to work 12/25         Education: Education  Goals: Education classes will be provided on a weekly basis, covering required topics. Participant will state understanding/return demonstration of topics presented.    Education     Row Name 04/28/24 0800     Education   Cardiac Education Topics Pritikin   Secondary School Teacher School   Educator Nurse;Respiratory Therapist   Weekly Topic Tasty Appetizers and Snacks   Instruction Review Code 1- Verbalizes Understanding   Class Start Time 0815   Class Stop Time 0846   Class Time Calculation (min) 31 min    Row Name 04/30/24 0800     Education   Cardiac Education Topics Pritikin   Select Workshops      Workshops   Educator Exercise Physiologist   Select Exercise   Exercise Workshop Managing Heart Disease: Your Path to a Healthier Heart   Instruction Review Code 1- Verbalizes Understanding   Class Start Time [phone]   Class Stop Time 0900   Class Time Calculation (min) 41 min    Row Name 05/03/24 0700     Education   Cardiac Education Topics Pritikin   Select Core Videos     Core Videos   Educator Exercise Physiologist   Select Psychosocial   Psychosocial Healthy Minds, Bodies, Hearts   Instruction Review Code 1- Verbalizes Understanding   Class Start Time 0815   Class Stop Time 0847   Class Time Calculation (min) 32 min    Row Name 05/05/24 0800     Education   Cardiac Education Topics Pritikin   Secondary School Teacher School   Educator Nurse;Respiratory Therapist   Weekly Topic Adding Flavor - Sodium-Free   Instruction Review Code 1- Verbalizes Understanding   Class Start Time 0815   Class Stop Time 0847   Class Time Calculation (min) 32 min    Row Name 05/07/24 0800     Education   Cardiac Education Topics Pritikin   Glass Blower/designer Nutrition   Nutrition Workshop Label Reading   Instruction Review Code 1- Verbalizes Understanding   Class Start Time 0815   Class Stop Time [phone]   Class Time Calculation (min) 37 min    Row Name 05/10/24 0900     Education   Cardiac Education Topics Pritikin   Select Workshops     Workshops   Educator Exercise Physiologist   Select Exercise   Exercise Workshop Location Manager and Fall Prevention   Instruction Review Code 1- Verbalizes Understanding   Class Start Time 0815   Class Stop Time 0850   Class Time Calculation (min) 35 min    Row Name 05/12/24 0800     Education   Cardiac Education Topics Pritikin   Secondary School Teacher School   Educator Nurse;Respiratory Therapist   Weekly Topic Fast and Healthy Breakfasts   Instruction Review Code  1- Verbalizes Understanding   Class Start Time 0815   Class Stop Time [phone]   Class Time Calculation (min) 37 min    Row Name 05/14/24 0800     Education   Cardiac Education Topics Pritikin   Select Core Videos     Core Videos   Educator Dietitian   Select Nutrition   Nutrition Other  label reading   Instruction Review Code 1- Verbalizes Understanding   Class Start Time 0815   Class Stop Time 0847   Class Time Calculation (min)  32 min    Row Name 05/17/24 0800     Education   Cardiac Education Topics Pritikin   Select Core Videos     Core Videos   Educator Exercise Physiologist   Select General Education   General Education Metabolic Syndrome and Belly Fat   Instruction Review Code 1- Verbalizes Understanding   Class Start Time [phone]   Class Stop Time 0849   Class Time Calculation (min) 38 min    Row Name 05/19/24 0800     Education   Cardiac Education Topics Pritikin   Secondary School Teacher School   Educator Dietitian;Respiratory Therapist   Weekly Topic Personalizing Your Pritikin Plate   Instruction Review Code 1- Verbalizes Understanding   Class Start Time [phone]   Class Stop Time 0851   Class Time Calculation (min) 39 min    Row Name 05/21/24 0800     Education   Cardiac Education Topics Pritikin   Select Core Videos     Core Videos   Educator Dietitian   Select Nutrition   Nutrition Overview of the Pritikin Eating Plan   Instruction Review Code 1- Verbalizes Understanding   Class Start Time 0815   Class Stop Time 0855   Class Time Calculation (min) 40 min    Row Name 05/24/24 0800     Education   Cardiac Education Topics Pritikin   Select Workshops     Workshops   Educator Exercise Physiologist   Select Psychosocial   Psychosocial Workshop Recognizing and Reducing Stress   Instruction Review Code 1- Verbalizes Understanding   Class Start Time [phone]   Class Stop Time 0900   Class Time Calculation (min) 48 min    Row Name  05/28/24 0800     Education   Cardiac Education Topics Pritikin   Select Core Videos     Core Videos   Educator Dietitian   Select Nutrition   Nutrition Calorie Density   Instruction Review Code 1- Verbalizes Understanding   Class Start Time [phone]   Class Stop Time 0848   Class Time Calculation (min) 32 min    Row Name 05/31/24 0800     Education   Cardiac Education Topics Pritikin   Psychologist, Forensic Exercise Education   Exercise Education Move It!   Instruction Review Code 1- Verbalizes Understanding   Class Start Time 0815   Class Stop Time 0854   Class Time Calculation (min) 39 min    Row Name 06/02/24 0800     Education   Cardiac Education Topics Pritikin   Secondary School Teacher School   Educator Dietitian   Weekly Topic Efficiency Cooking - Meals in a Snap   Instruction Review Code 1- Verbalizes Understanding   Class Start Time 0815   Class Stop Time 0904   Class Time Calculation (min) 49 min    Row Name 06/04/24 0900     Education   Cardiac Education Topics Pritikin   Select Workshops     Workshops   Educator Exercise Physiologist   Select Exercise   Exercise Workshop Exercise Basics: Building Your Action Plan   Instruction Review Code 1- Verbalizes Understanding   Class Start Time 0815   Class Stop Time 0851   Class Time Calculation (min) 36 min    Row Name 06/07/24 0800     Education   Cardiac Education  Topics Pritikin   Glass Blower/designer Nutrition   Nutrition Workshop Targeting Your Nutrition Priorities   Instruction Review Code 1- Verbalizes Understanding   Class Start Time 0815   Class Stop Time 0901   Class Time Calculation (min) 46 min    Row Name 06/09/24 0700     Education   Cardiac Education Topics Pritikin   Secondary School Teacher School   Educator Dietitian   Weekly Topic One-Pot Wonders    Instruction Review Code 1- Verbalizes Understanding   Class Start Time [phone]   Class Stop Time 0858   Class Time Calculation (min) 44 min    Row Name 06/14/24 0900     Education   Cardiac Education Topics Pritikin   Select Workshops     Workshops   Educator Exercise Physiologist   Select Psychosocial   Psychosocial Workshop Focused Goals, Sustainable Changes   Instruction Review Code 1- Verbalizes Understanding   Class Start Time 0815   Class Stop Time 0850   Class Time Calculation (min) 35 min    Row Name 06/16/24 0900     Education   Cardiac Education Topics Pritikin   Orthoptist   Educator Dietitian   Weekly Topic Comforting Weekend Breakfasts   Instruction Review Code 1- Verbalizes Understanding   Class Start Time 0815   Class Stop Time 0857   Class Time Calculation (min) 42 min    Row Name 06/18/24 0800     Education   Cardiac Education Topics Pritikin   Select Core Videos     Core Videos   Educator Dietitian   Select Nutrition   Nutrition Dining Out - Part 1   Instruction Review Code 1- Verbalizes Understanding   Class Start Time [phone]   Class Stop Time 0853   Class Time Calculation (min) 37 min    Row Name 06/21/24 0800     Education   Cardiac Education Topics Pritikin   Select Core Videos     Core Videos   Educator Exercise Physiologist   Select Exercise Education   Exercise Education Biomechanial Limitations   Instruction Review Code 1- Verbalizes Understanding   Class Start Time 0815   Class Stop Time 0855   Class Time Calculation (min) 40 min    Row Name 06/23/24 0800     Education   Cardiac Education Topics Pritikin   Secondary School Teacher School   Educator Dietitian   Weekly Topic Fast Evening Meals   Instruction Review Code 1- Verbalizes Understanding   Class Start Time 0815   Class Stop Time 0902   Class Time Calculation (min) 47 min    Row Name 06/25/24 0800     Education   Cardiac  Education Topics Pritikin   Select Core Videos     Core Videos   Educator Dietitian   Select Nutrition   Nutrition Vitamins and Minerals   Instruction Review Code 1- Verbalizes Understanding   Class Start Time 0815   Class Stop Time 0856   Class Time Calculation (min) 41 min    Row Name 06/28/24 0700     Education   Cardiac Education Topics Pritikin   Select Core Videos     Core Videos   Educator Exercise Physiologist   Select Exercise Education   Exercise Education Improving Performance   Instruction Review Code 1-  Verbalizes Understanding   Class Start Time 0820   Class Stop Time 0855   Class Time Calculation (min) 35 min    Row Name 06/30/24 0800     Education   Cardiac Education Topics Pritikin   Customer Service Manager   Weekly Topic [employer]- Spotlight on the [facility]   Instruction Review Code 1- Verbalizes Understanding   Class Start Time 0815   Class Stop Time 0903   Class Time Calculation (min) 48 min    Row Name 07/02/24 0800     Education   Cardiac Education Topics Pritikin   Select Workshops     Core Videos   Educator --   Select --   Nutrition --   Instruction Review Code --     Workshops   Geophysical Data Processor Nutrition   Nutrition Workshop Fueling a Forensic Psychologist   Instruction Review Code 1- Verbalizes Understanding   Class Start Time 0815   Class Stop Time 0901   Class Time Calculation (min) 46 min    Row Name 07/05/24 0900     Education   Cardiac Education Topics Pritikin   Select Workshops     Workshops   Educator Exercise Physiologist   Select Psychosocial   Psychosocial Workshop Healthy Sleep for a Healthy Heart   Instruction Review Code 1- Verbalizes Understanding   Class Start Time 0815   Class Stop Time 0856   Class Time Calculation (min) 41 min    Row Name 07/07/24 0800     Education   Cardiac Education Topics Pritikin   Librarian, Academic School   Educator Dietitian   Weekly Topic Simple Sides and Sauces   Instruction Review Code 1- Verbalizes Understanding   Class Start Time 0815   Class Stop Time 0856   Class Time Calculation (min) 41 min    Row Name 07/12/24 0800     Education   Cardiac Education Topics Pritikin   Select Workshops     Core Videos   Educator Nurse   Psychosocial How Our Thoughts Can Heal Our Hearts   Class Start Time [phone]   Class Stop Time [phone]   Class Time Calculation (min) 33 min      Core Videos: Exercise    Move It!  Clinical staff conducted group or individual video education with verbal and written material and guidebook.  Patient learns the recommended Pritikin exercise program. Exercise with the goal of living a long, healthy life. Some of the health benefits of exercise include controlled diabetes, healthier blood pressure levels, improved cholesterol levels, improved heart and lung capacity, improved sleep, and better body composition. Everyone should speak with their doctor before starting or changing an exercise routine.  Biomechanical Limitations Clinical staff conducted group or individual video education with verbal and written material and guidebook.  Patient learns how biomechanical limitations can impact exercise and how we can mitigate and possibly overcome limitations to have an impactful and balanced exercise routine.  Body Composition Clinical staff conducted group or individual video education with verbal and written material and guidebook.  Patient learns that body composition (ratio of muscle mass to fat mass) is a key component to assessing overall fitness, rather than body weight alone. Increased fat mass, especially visceral belly fat, can put us  at increased risk for metabolic syndrome, type 2 diabetes, heart disease, and even death. It is recommended to combine  diet and exercise (cardiovascular and resistance training) to improve your body composition. Seek  guidance from your physician and exercise physiologist before implementing an exercise routine.  Exercise Action Plan Clinical staff conducted group or individual video education with verbal and written material and guidebook.  Patient learns the recommended strategies to achieve and enjoy long-term exercise adherence, including variety, self-motivation, self-efficacy, and positive decision making. Benefits of exercise include fitness, good health, weight management, more energy, better sleep, less stress, and overall well-being.  Medical   Heart Disease Risk Reduction Clinical staff conducted group or individual video education with verbal and written material and guidebook.  Patient learns our heart is our most vital organ as it circulates oxygen, nutrients, white blood cells, and hormones throughout the entire body, and carries waste away. Data supports a plant-based eating plan like the Pritikin Program for its effectiveness in slowing progression of and reversing heart disease. The video provides a number of recommendations to address heart disease.   Metabolic Syndrome and Belly Fat  Clinical staff conducted group or individual video education with verbal and written material and guidebook.  Patient learns what metabolic syndrome is, how it leads to heart disease, and how one can reverse it and keep it from coming back. You have metabolic syndrome if you have 3 of the following 5 criteria: abdominal obesity, high blood pressure, high triglycerides, low HDL cholesterol, and high blood sugar.  Hypertension and Heart Disease Clinical staff conducted group or individual video education with verbal and written material and guidebook.  Patient learns that high blood pressure, or hypertension, is very common in the [place] . Hypertension is largely due to excessive salt intake, but other important risk factors include being overweight, physical inactivity, drinking too much alcohol, smoking, and  not eating enough potassium from fruits and vegetables. High blood pressure is a leading risk factor for heart attack, stroke, congestive heart failure, dementia, kidney failure, and premature death. Long-term effects of excessive salt intake include stiffening of the arteries and thickening of heart muscle and organ damage. Recommendations include ways to reduce hypertension and the risk of heart disease.  Diseases of Our Time - Focusing on Diabetes Clinical staff conducted group or individual video education with verbal and written material and guidebook.  Patient learns why the best way to stop diseases of our time is prevention, through food and other lifestyle changes. Medicine (such as prescription pills and surgeries) is often only a Band-Aid on the problem, not a long-term solution. Most common diseases of our time include obesity, type 2 diabetes, hypertension, heart disease, and cancer. The Pritikin Program is recommended and has been proven to help reduce, reverse, and/or prevent the damaging effects of metabolic syndrome.  Nutrition   Overview of the Pritikin Eating Plan  Clinical staff conducted group or individual video education with verbal and written material and guidebook.  Patient learns about the Pritikin Eating Plan for disease risk reduction. The Pritikin Eating Plan emphasizes a wide variety of unrefined, minimally-processed carbohydrates, like fruits, vegetables, whole grains, and legumes. Go, Caution, and Stop food choices are explained. Plant-based and lean animal proteins are emphasized. Rationale provided for low sodium intake for blood pressure control, low added sugars for blood sugar stabilization, and low added fats and oils for coronary artery disease risk reduction and weight management.  Calorie Density  Clinical staff conducted group or individual video education with verbal and written material and guidebook.  Patient learns about calorie density and how it impacts  the Pritikin  Eating Plan. Knowing the characteristics of the food you choose will help you decide whether those foods will lead to weight gain or weight loss, and whether you want to consume more or less of them. Weight loss is usually a side effect of the Pritikin Eating Plan because of its focus on low calorie-dense foods.  Label Reading  Clinical staff conducted group or individual video education with verbal and written material and guidebook.  Patient learns about the Pritikin recommended label reading guidelines and corresponding recommendations regarding calorie density, added sugars, sodium content, and whole grains.  Dining Out - Part 1  Clinical staff conducted group or individual video education with verbal and written material and guidebook.  Patient learns that restaurant meals can be sabotaging because they can be so high in calories, fat, sodium, and/or sugar. Patient learns recommended strategies on how to positively address this and avoid unhealthy pitfalls.  Facts on Fats  Clinical staff conducted group or individual video education with verbal and written material and guidebook.  Patient learns that lifestyle modifications can be just as effective, if not more so, as many medications for lowering your risk of heart disease. A Pritikin lifestyle can help to reduce your risk of inflammation and atherosclerosis (cholesterol build-up, or plaque, in the artery walls). Lifestyle interventions such as dietary choices and physical activity address the cause of atherosclerosis. A review of the types of fats and their impact on blood cholesterol levels, along with dietary recommendations to reduce fat intake is also included.  Nutrition Action Plan  Clinical staff conducted group or individual video education with verbal and written material and guidebook.  Patient learns how to incorporate Pritikin recommendations into their lifestyle. Recommendations include planning and keeping personal  health goals in mind as an important part of their success.  Healthy Mind-Set    Healthy Minds, Bodies, Hearts  Clinical staff conducted group or individual video education with verbal and written material and guidebook.  Patient learns how to identify when they are stressed. Video will discuss the impact of that stress, as well as the many benefits of stress management. Patient will also be introduced to stress management techniques. The way we think, act, and feel has an impact on our hearts.  How Our Thoughts Can Heal Our Hearts  Clinical staff conducted group or individual video education with verbal and written material and guidebook.  Patient learns that negative thoughts can cause depression and anxiety. This can result in negative lifestyle behavior and serious health problems. Cognitive behavioral therapy is an effective method to help control our thoughts in order to change and improve our emotional outlook.  Additional Videos:  Exercise    Improving Performance  Clinical staff conducted group or individual video education with verbal and written material and guidebook.  Patient learns to use a non-linear approach by alternating intensity levels and lengths of time spent exercising to help burn more calories and lose more body fat. Cardiovascular exercise helps improve heart health, metabolism, hormonal balance, blood sugar control, and recovery from fatigue. Resistance training improves strength, endurance, balance, coordination, reaction time, metabolism, and muscle mass. Flexibility exercise improves circulation, posture, and balance. Seek guidance from your physician and exercise physiologist before implementing an exercise routine and learn your capabilities and proper form for all exercise.  Introduction to Yoga  Clinical staff conducted group or individual video education with verbal and written material and guidebook.  Patient learns about yoga, a discipline of the coming  together of mind, breath, and  body. The benefits of yoga include improved flexibility, improved range of motion, better posture and core strength, increased lung function, weight loss, and positive self-image. Yogas heart health benefits include lowered blood pressure, healthier heart rate, decreased cholesterol and triglyceride levels, improved immune function, and reduced stress. Seek guidance from your physician and exercise physiologist before implementing an exercise routine and learn your capabilities and proper form for all exercise.  Medical   Aging: Enhancing Your Quality of Life  Clinical staff conducted group or individual video education with verbal and written material and guidebook.  Patient learns key strategies and recommendations to stay in good physical health and enhance quality of life, such as prevention strategies, having an advocate, securing a Health Care Proxy and Power of Attorney, and keeping a list of medications and system for tracking them. It also discusses how to avoid risk for bone loss.  Biology of Weight Control  Clinical staff conducted group or individual video education with verbal and written material and guidebook.  Patient learns that weight gain occurs because we consume more calories than we burn (eating more, moving less). Even if your body weight is normal, you may have higher ratios of fat compared to muscle mass. Too much body fat puts you at increased risk for cardiovascular disease, heart attack, stroke, type 2 diabetes, and obesity-related cancers. In addition to exercise, following the Pritikin Eating Plan can help reduce your risk.  Decoding Lab Results  Clinical staff conducted group or individual video education with verbal and written material and guidebook.  Patient learns that lab test reflects one measurement whose values change over time and are influenced by many factors, including medication, stress, sleep, exercise, food, hydration,  pre-existing medical conditions, and more. It is recommended to use the knowledge from this video to become more involved with your lab results and evaluate your numbers to speak with your doctor.   Diseases of Our Time - Overview  Clinical staff conducted group or individual video education with verbal and written material and guidebook.  Patient learns that according to the CDC, 50% to 70% of chronic diseases (such as obesity, type 2 diabetes, elevated lipids, hypertension, and heart disease) are avoidable through lifestyle improvements including healthier food choices, listening to satiety cues, and increased physical activity.  Sleep Disorders Clinical staff conducted group or individual video education with verbal and written material and guidebook.  Patient learns how good quality and duration of sleep are important to overall health and well-being. Patient also learns about sleep disorders and how they impact health along with recommendations to address them, including discussing with a physician.  Nutrition  Dining Out - Part 2 Clinical staff conducted group or individual video education with verbal and written material and guidebook.  Patient learns how to plan ahead and communicate in order to maximize their dining experience in a healthy and nutritious manner. Included are recommended food choices based on the type of restaurant the patient is visiting.   Fueling a Banker conducted group or individual video education with verbal and written material and guidebook.  There is a strong connection between our food choices and our health. Diseases like obesity and type 2 diabetes are very prevalent and are in large-part due to lifestyle choices. The Pritikin Eating Plan provides plenty of food and hunger-curbing satisfaction. It is easy to follow, affordable, and helps reduce health risks.  Menu Workshop  Clinical staff conducted group or individual video education  with verbal and written  material and guidebook.  Patient learns that restaurant meals can sabotage health goals because they are often packed with calories, fat, sodium, and sugar. Recommendations include strategies to plan ahead and to communicate with the manager, chef, or server to help order a healthier meal.  Planning Your Eating Strategy  Clinical staff conducted group or individual video education with verbal and written material and guidebook.  Patient learns about the Pritikin Eating Plan and its benefit of reducing the risk of disease. The Pritikin Eating Plan does not focus on calories. Instead, it emphasizes high-quality, nutrient-rich foods. By knowing the characteristics of the foods, we choose, we can determine their calorie density and make informed decisions.  Targeting Your Nutrition Priorities  Clinical staff conducted group or individual video education with verbal and written material and guidebook.  Patient learns that lifestyle habits have a tremendous impact on disease risk and progression. This video provides eating and physical activity recommendations based on your personal health goals, such as reducing LDL cholesterol, losing weight, preventing or controlling type 2 diabetes, and reducing high blood pressure.  Vitamins and Minerals  Clinical staff conducted group or individual video education with verbal and written material and guidebook.  Patient learns different ways to obtain key vitamins and minerals, including through a recommended healthy diet. It is important to discuss all supplements you take with your doctor.   Healthy Mind-Set    Smoking Cessation  Clinical staff conducted group or individual video education with verbal and written material and guidebook.  Patient learns that cigarette smoking and tobacco addiction pose a serious health risk which affects millions of people. Stopping smoking will significantly reduce the risk of heart disease, lung disease,  and many forms of cancer. Recommended strategies for quitting are covered, including working with your doctor to develop a successful plan.  Culinary   Becoming a Set Designer conducted group or individual video education with verbal and written material and guidebook.  Patient learns that cooking at home can be healthy, cost-effective, quick, and puts them in control. Keys to cooking healthy recipes will include looking at your recipe, assessing your equipment needs, planning ahead, making it simple, choosing cost-effective seasonal ingredients, and limiting the use of added fats, salts, and sugars.  Cooking - Breakfast and Snacks  Clinical staff conducted group or individual video education with verbal and written material and guidebook.  Patient learns how important breakfast is to satiety and nutrition through the entire day. Recommendations include key foods to eat during breakfast to help stabilize blood sugar levels and to prevent overeating at meals later in the day. Planning ahead is also a key component.  Cooking - Educational Psychologist conducted group or individual video education with verbal and written material and guidebook.  Patient learns eating strategies to improve overall health, including an approach to cook more at home. Recommendations include thinking of animal protein as a side on your plate rather than center stage and focusing instead on lower calorie dense options like vegetables, fruits, whole grains, and plant-based proteins, such as beans. Making sauces in large quantities to freeze for later and leaving the skin on your vegetables are also recommended to maximize your experience.  Cooking - Healthy Salads and Dressing Clinical staff conducted group or individual video education with verbal and written material and guidebook.  Patient learns that vegetables, fruits, whole grains, and legumes are the foundations of the Pritikin Eating Plan.  Recommendations include how to incorporate each of  these in flavorful and healthy salads, and how to create homemade salad dressings. Proper handling of ingredients is also covered. Cooking - Soups and [employer] - Soups and Desserts Clinical staff conducted group or individual video education with verbal and written material and guidebook.  Patient learns that Pritikin soups and desserts make for easy, nutritious, and delicious snacks and meal components that are low in sodium, fat, sugar, and calorie density, while high in vitamins, minerals, and filling fiber. Recommendations include simple and healthy ideas for soups and desserts.   Overview     The Pritikin Solution Program Overview Clinical staff conducted group or individual video education with verbal and written material and guidebook.  Patient learns that the results of the Pritikin Program have been documented in more than 100 articles published in peer-reviewed journals, and the benefits include reducing risk factors for (and, in some cases, even reversing) high cholesterol, high blood pressure, type 2 diabetes, obesity, and more! An overview of the three key pillars of the Pritikin Program will be covered: eating well, doing regular exercise, and having a healthy mind-set.  WORKSHOPS  Exercise: Exercise Basics: Building Your Action Plan Clinical staff led group instruction and group discussion with PowerPoint presentation and patient guidebook. To enhance the learning environment the use of posters, models and videos may be added. At the conclusion of this workshop, patients will comprehend the difference between physical activity and exercise, as well as the benefits of incorporating both, into their routine. Patients will understand the FITT (Frequency, Intensity, Time, and Type) principle and how to use it to build an exercise action plan. In addition, safety concerns and other considerations for exercise and cardiac rehab  will be addressed by the presenter. The purpose of this lesson is to promote a comprehensive and effective weekly exercise routine in order to improve patients overall level of fitness.   Managing Heart Disease: Your Path to a Healthier Heart Clinical staff led group instruction and group discussion with PowerPoint presentation and patient guidebook. To enhance the learning environment the use of posters, models and videos may be added.At the conclusion of this workshop, patients will understand the anatomy and physiology of the heart. Additionally, they will understand how Pritikins three pillars impact the risk factors, the progression, and the management of heart disease.  The purpose of this lesson is to provide a high-level overview of the heart, heart disease, and how the Pritikin lifestyle positively impacts risk factors.  Exercise Biomechanics Clinical staff led group instruction and group discussion with PowerPoint presentation and patient guidebook. To enhance the learning environment the use of posters, models and videos may be added. Patients will learn how the structural parts of their bodies function and how these functions impact their daily activities, movement, and exercise. Patients will learn how to promote a neutral spine, learn how to manage pain, and identify ways to improve their physical movement in order to promote healthy living. The purpose of this lesson is to expose patients to common physical limitations that impact physical activity. Participants will learn practical ways to adapt and manage aches and pains, and to minimize their effect on regular exercise. Patients will learn how to maintain good posture while sitting, walking, and lifting.  Balance Training and Fall Prevention  Clinical staff led group instruction and group discussion with PowerPoint presentation and patient guidebook. To enhance the learning environment the use of posters, models and videos  may be added. At the conclusion of this workshop, patients will understand  the importance of their sensorimotor skills (vision, proprioception, and the vestibular system) in maintaining their ability to balance as they age. Patients will apply a variety of balancing exercises that are appropriate for their current level of function. Patients will understand the common causes for poor balance, possible solutions to these problems, and ways to modify their physical environment in order to minimize their fall risk. The purpose of this lesson is to teach patients about the importance of maintaining balance as they age and ways to minimize their risk of falling.  WORKSHOPS   Nutrition:  Fueling a Ship Broker led group instruction and group discussion with PowerPoint presentation and patient guidebook. To enhance the learning environment the use of posters, models and videos may be added. Patients will review the foundational principles of the Pritikin Eating Plan and understand what constitutes a serving size in each of the food groups. Patients will also learn Pritikin-friendly foods that are better choices when away from home and review make-ahead meal and snack options. Calorie density will be reviewed and applied to three nutrition priorities: weight maintenance, weight loss, and weight gain. The purpose of this lesson is to reinforce (in a group setting) the key concepts around what patients are recommended to eat and how to apply these guidelines when away from home by planning and selecting Pritikin-friendly options. Patients will understand how calorie density may be adjusted for different weight management goals.  Mindful Eating  Clinical staff led group instruction and group discussion with PowerPoint presentation and patient guidebook. To enhance the learning environment the use of posters, models and videos may be added. Patients will briefly review the concepts of the Pritikin  Eating Plan and the importance of low-calorie dense foods. The concept of mindful eating will be introduced as well as the importance of paying attention to internal hunger signals. Triggers for non-hunger eating and techniques for dealing with triggers will be explored. The purpose of this lesson is to provide patients with the opportunity to review the basic principles of the Pritikin Eating Plan, discuss the value of eating mindfully and how to measure internal cues of hunger and fullness using the Hunger Scale. Patients will also discuss reasons for non-hunger eating and learn strategies to use for controlling emotional eating.  Targeting Your Nutrition Priorities Clinical staff led group instruction and group discussion with PowerPoint presentation and patient guidebook. To enhance the learning environment the use of posters, models and videos may be added. Patients will learn how to determine their genetic susceptibility to disease by reviewing their family history. Patients will gain insight into the importance of diet as part of an overall healthy lifestyle in mitigating the impact of genetics and other environmental insults. The purpose of this lesson is to provide patients with the opportunity to assess their personal nutrition priorities by looking at their family history, their own health history and current risk factors. Patients will also be able to discuss ways of prioritizing and modifying the Pritikin Eating Plan for their highest risk areas  Menu  Clinical staff led group instruction and group discussion with PowerPoint presentation and patient guidebook. To enhance the learning environment the use of posters, models and videos may be added. Using menus brought in from [employer], or printed from [employer], patients will apply the Pritikin dining out guidelines that were presented in the [employer] video. Patients will also be able to practice these guidelines  in a variety of provided scenarios. The purpose of  this lesson is to provide patients with the opportunity to practice hands-on learning of the Pritikin Dining Out guidelines with actual menus and practice scenarios.  Label Reading Clinical staff led group instruction and group discussion with PowerPoint presentation and patient guidebook. To enhance the learning environment the use of posters, models and videos may be added. Patients will review and discuss the Pritikin label reading guidelines presented in Pritikins Label Reading Educational series video. Using fool labels brought in from local grocery stores and markets, patients will apply the label reading guidelines and determine if the packaged food meet the Pritikin guidelines. The purpose of this lesson is to provide patients with the opportunity to review, discuss, and practice hands-on learning of the Pritikin Label Reading guidelines with actual packaged food labels. Cooking School  Pritikins [employer] are designed to teach patients ways to prepare quick, simple, and affordable recipes at home. The importance of nutritions role in chronic disease risk reduction is reflected in its emphasis in the overall Pritikin program. By learning how to prepare essential core Pritikin Eating Plan recipes, patients will increase control over what they eat; be able to customize the flavor of foods without the use of added salt, sugar, or fat; and improve the quality of the food they consume. By learning a set of core recipes which are easily assembled, quickly prepared, and affordable, patients are more likely to prepare more healthy foods at home. These workshops focus on convenient breakfasts, simple entres, side dishes, and desserts which can be prepared with minimal effort and are consistent with nutrition recommendations for cardiovascular risk reduction. Cooking [employer] are taught by a armed forces logistics/support/administrative officer (RD) who has  been trained by the [employer]. The chef or RD has a clear understanding of the importance of minimizing - if not completely eliminating - added fat, sugar, and sodium in recipes. Throughout the series of Cooking School Workshop sessions, patients will learn about healthy ingredients and efficient methods of cooking to build confidence in their capability to prepare    Cooking School weekly topics:  Adding Flavor- Sodium-Free  Fast and Healthy Breakfasts  Powerhouse Plant-Based Proteins  Satisfying Salads and Dressings  Simple Sides and Sauces  International Cuisine-Spotlight on the [employer] Zones  Delicious Desserts  Savory Soups  [employer] - Meals in a Astronomer Appetizers and Snacks  Comforting Weekend Breakfasts  One-Pot Wonders   Fast Evening Meals  Landscape Architect Your Pritikin Plate  WORKSHOPS   Healthy Mindset (Psychosocial):  Focused Goals, Sustainable Changes Clinical staff led group instruction and group discussion with PowerPoint presentation and patient guidebook. To enhance the learning environment the use of posters, models and videos may be added. Patients will be able to apply effective goal setting strategies to establish at least one personal goal, and then take consistent, meaningful action toward that goal. They will learn to identify common barriers to achieving personal goals and develop strategies to overcome them. Patients will also gain an understanding of how our mind-set can impact our ability to achieve goals and the importance of cultivating a positive and growth-oriented mind-set. The purpose of this lesson is to provide patients with a deeper understanding of how to set and achieve personal goals, as well as the tools and strategies needed to overcome common obstacles which may arise along the way.  From Head to Heart: The Power of a Healthy Outlook  Clinical staff led group instruction and group discussion with  PowerPoint  presentation and patient guidebook. To enhance the learning environment the use of posters, models and videos may be added. Patients will be able to recognize and describe the impact of emotions and mood on physical health. They will discover the importance of self-care and explore self-care practices which may work for them. Patients will also learn how to utilize the 4 Cs to cultivate a healthier outlook and better manage stress and challenges. The purpose of this lesson is to demonstrate to patients how a healthy outlook is an essential part of maintaining good health, especially as they continue their cardiac rehab journey.  Healthy Sleep for a Healthy Heart Clinical staff led group instruction and group discussion with PowerPoint presentation and patient guidebook. To enhance the learning environment the use of posters, models and videos may be added. At the conclusion of this workshop, patients will be able to demonstrate knowledge of the importance of sleep to overall health, well-being, and quality of life. They will understand the symptoms of, and treatments for, common sleep disorders. Patients will also be able to identify daytime and nighttime behaviors which impact sleep, and they will be able to apply these tools to help manage sleep-related challenges. The purpose of this lesson is to provide patients with a general overview of sleep and outline the importance of quality sleep. Patients will learn about a few of the most common sleep disorders. Patients will also be introduced to the concept of sleep hygiene, and discover ways to self-manage certain sleeping problems through simple daily behavior changes. Finally, the workshop will motivate patients by clarifying the links between quality sleep and their goals of heart-healthy living.   Recognizing and Reducing Stress Clinical staff led group instruction and group discussion with PowerPoint presentation and patient guidebook. To  enhance the learning environment the use of posters, models and videos may be added. At the conclusion of this workshop, patients will be able to understand the types of stress reactions, differentiate between acute and chronic stress, and recognize the impact that chronic stress has on their health. They will also be able to apply different coping mechanisms, such as reframing negative self-talk. Patients will have the opportunity to practice a variety of stress management techniques, such as deep abdominal breathing, progressive muscle relaxation, and/or guided imagery.  The purpose of this lesson is to educate patients on the role of stress in their lives and to provide healthy techniques for coping with it.  Learning Barriers/Preferences:  Learning Barriers/Preferences - 04/21/24 1138       Learning Barriers/Preferences   Learning Barriers Sight    Learning Preferences Computer/Internet;Group Instruction;Individual Instruction;Skilled Demonstration;Verbal Instruction;Written Material;Video;Pictoral          Education Topics:  Knowledge Questionnaire Score:  Knowledge Questionnaire Score - 04/21/24 1148       Knowledge Questionnaire Score   Pre Score 22/24          Core Components/Risk Factors/Patient Goals at Admission:  Personal Goals and Risk Factors at Admission - 04/21/24 1139       Core Components/Risk Factors/Patient Goals on Admission    Weight Management Yes;Weight Maintenance    Intervention Weight Management: Develop a combined nutrition and exercise program designed to reach desired caloric intake, while maintaining appropriate intake of nutrient and fiber, sodium and fats, and appropriate energy expenditure required for the weight goal.;Weight Management: Provide education and appropriate resources to help participant work on and attain dietary goals.    Expected Outcomes Short Term: Continue to assess and modify interventions until  short term weight is achieved;Long  Term: Adherence to nutrition and physical activity/exercise program aimed toward attainment of established weight goal;Weight Maintenance: Understanding of the daily nutrition guidelines, which includes 25-35% calories from fat, 7% or less cal from saturated fats, less than 200mg  cholesterol, less than 1.5gm of sodium, & 5 or more servings of fruits and vegetables daily;Understanding recommendations for meals to include 15-35% energy as protein, 25-35% energy from fat, 35-60% energy from carbohydrates, less than 200mg  of dietary cholesterol, 20-35 gm of total fiber daily;Understanding of distribution of calorie intake throughout the day with the consumption of 4-5 meals/snacks    Tobacco Cessation Yes    Number of packs per day 5 cigarettes a day    Intervention Assist the participant in steps to quit. Provide individualized education and counseling about committing to Tobacco Cessation, relapse prevention, and pharmacological support that can be provided by physician.;Education officer, environmental, assist with locating and accessing local/national Quit Smoking programs, and support quit date choice.    Expected Outcomes Short Term: Will demonstrate readiness to quit, by selecting a quit date.;Long Term: Complete abstinence from all tobacco products for at least 12 months from quit date.;Short Term: Will quit all tobacco product use, adhering to prevention of relapse plan.    Diabetes Yes    Intervention Provide education about signs/symptoms and action to take for hypo/hyperglycemia.;Provide education about proper nutrition, including hydration, and aerobic/resistive exercise prescription along with prescribed medications to achieve blood glucose in normal ranges: Fasting glucose 65-99 mg/dL    Expected Outcomes Short Term: Participant verbalizes understanding of the signs/symptoms and immediate care of hyper/hypoglycemia, proper foot care and importance of medication, aerobic/resistive exercise and  nutrition plan for blood glucose control.;Long Term: Attainment of HbA1C < 7%.    Hypertension Yes    Intervention Provide education on lifestyle modifcations including regular physical activity/exercise, weight management, moderate sodium restriction and increased consumption of fresh fruit, vegetables, and low fat dairy, alcohol moderation, and smoking cessation.;Monitor prescription use compliance.    Expected Outcomes Long Term: Maintenance of blood pressure at goal levels.;Short Term: Continued assessment and intervention until BP is < 140/57mm HG in hypertensive participants. < 130/1mm HG in hypertensive participants with diabetes, heart failure or chronic kidney disease.    Lipids Yes    Intervention Provide education and support for participant on nutrition & aerobic/resistive exercise along with prescribed medications to achieve LDL 70mg , HDL >40mg .    Expected Outcomes Short Term: Participant states understanding of desired cholesterol values and is compliant with medications prescribed. Participant is following exercise prescription and nutrition guidelines.;Long Term: Cholesterol controlled with medications as prescribed, with individualized exercise RX and with personalized nutrition plan. Value goals: LDL < 70mg , HDL > 40 mg.          Core Components/Risk Factors/Patient Goals Review:   Goals and Risk Factor Review     Row Name 05/05/24 0841 05/11/24 1513 06/15/24 1130 07/10/24 1219       Core Components/Risk Factors/Patient Goals Review   Personal Goals Review Weight Management/Obesity;Hypertension;Lipids;Tobacco Cessation Weight Management/Obesity;Hypertension;Lipids;Tobacco Cessation Weight Management/Obesity;Hypertension;Lipids;Tobacco Cessation Weight Management/Obesity;Hypertension;Lipids;Tobacco Cessation    Review Kinsley started cardiac rehab on 05/03/24. Doyce is off to a good start to exercise. Some systolic blood pressures noted in the 90's asymptomatic. Will continue  to monitor BP's. Dewon started cardiac rehab on 05/03/24. Varun is off to a good start to exercise. Some systolic blood pressures noted in the 90's asymptomatic. Will continue to monitor BP's. [name] PAC notifed about BP's. Shandy is  doing well with exercise at cardiac rehab. Vital signs have been stable. Khalee has increased his met levels. Will continue to enourage smoking cessation. Odies has been doing  well with exercise at cardiac rehab. Vital signs have been stable. Antoinne has increased his met levels. Will continue to enourage smoking cessation. Marcelo is supposed to complete cardiac rehab on 08/15/23. Will need to get cardiology clearnace for Sarthak to return to exercise as he went to the ED on 07/08/24.    Expected Outcomes Jadarion will continue to participate in cardiac rehab for exercise, nutriiton and lifestyle modifications Willson will continue to participate in cardiac rehab for exercise, nutriiton and lifestyle modifications Devian will continue to participate in cardiac rehab for exercise, nutriiton and lifestyle modifications Zakariye will continue to participate in cardiac rehab for exercise, nutriiton and lifestyle modifications       Core Components/Risk Factors/Patient Goals at Discharge (Final Review):   Goals and Risk Factor Review - 07/10/24 1219       Core Components/Risk Factors/Patient Goals Review   Personal Goals Review Weight Management/Obesity;Hypertension;Lipids;Tobacco Cessation    Review Phillip Clark has been doing  well with exercise at cardiac rehab. Vital signs have been stable. Phillip Clark has increased his met levels. Will continue to enourage smoking cessation. Adael is supposed to complete cardiac rehab on 08/15/23. Will need to get cardiology clearnace for Dianne to return to exercise as he went to the ED on 07/08/24.    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 Introduction to pritikin education/intensive cardiac rehab. Initial orientation packet reviewed with patient. 30 Day ITP Review. Phillip Clark started cardiac rehab on 04/30/24. Phillip Clark did well with exercise. 30 Day ITP Review. Phillip Clark started cardiac rehab on 04/30/24. Phillip Clark is off to a good start with exercise. 30 Day ITP Review. Phillip Clark has good attendance and participation with exercise at cardiac rehab 30 Day ITP Review. Phillip Clark continues to have  good attendance and participation with exercise at cardiac rehab. Phillip Clark will tenatively complete cardiac rehab on 07/14/24.      Comments: See ITP comments.[name] Phillip Quan RN BSN      [1]  Current Outpatient Medications:    acetaminophen  (TYLENOL ) 325 MG tablet, Take 2 tablets (650 mg total) by mouth every 6 (six) hours as needed., Disp: , Rfl:    aspirin  EC 325 MG tablet, Take 1 tablet (325 mg total) by mouth daily., Disp: , Rfl:    atorvastatin  (LIPITOR) 40 MG tablet, Take 1 tablet (40 mg total) by mouth at bedtime., Disp: 90 tablet, Rfl: 3   colchicine  0.6 MG tablet, Take 1 tablet (0.6 mg total) by mouth 2 (two) times daily., Disp: 14 tablet, Rfl: 0   colchicine  0.6 MG tablet, Take 1 tablet (0.6 mg total) by mouth 2 (two) times daily., Disp: 60 tablet, Rfl: 2   ezetimibe  (ZETIA ) 10 MG tablet, Take 1 tablet (10 mg total) by mouth daily., Disp: 90 tablet, Rfl: 3   JARDIANCE  25 MG TABS tablet, Take 25 mg by mouth daily., Disp: , Rfl:    magnesium  oxide (MAG-OX) 400 MG tablet, Take 400 mg by mouth daily., Disp: , Rfl:    metoprolol  tartrate (LOPRESSOR ) 25 MG tablet, Take 1 tablet (25 mg total) by  mouth 2 (two) times daily., Disp: 180 tablet, Rfl: 3   Multiple Vitamin (MULTIVITAMIN ADULT PO), Take 1 tablet by mouth daily., Disp: , Rfl:    nitroGLYCERIN  (NITROSTAT ) 0.4 MG SL tablet, Place 1  tablet (0.4 mg total) under the tongue every 5 (five) minutes as needed for chest pain., Disp: 25 tablet, Rfl: 3   Omega-3 Fatty Acids (FISH OIL) 1000 MG CAPS, Take 1,000 mg by mouth daily., Disp: , Rfl:  [2]  Social History Tobacco Use  Smoking Status Every Day   Current packs/day: 0.50   Average packs/day: 0.5 packs/day for 41.0 years (20.5 ttl pk-yrs)   Types: Cigarettes   Start date: 74   Last attempt to quit: 2018  Smokeless Tobacco Never

## 2024-07-12 ENCOUNTER — Telehealth: Payer: Self-pay

## 2024-07-12 ENCOUNTER — Encounter (HOSPITAL_COMMUNITY)
Admission: RE | Admit: 2024-07-12 | Discharge: 2024-07-12 | Disposition: A | Discharge status: Home / Self Care | Source: Ambulatory Visit | Attending: Cardiology | Admitting: Cardiology

## 2024-07-12 MED ORDER — NITROGLYCERIN 0.4 MG SL SUBL: 0.4000 mg | SUBLINGUAL_TABLET | SUBLINGUAL | 3 refills | Status: AC | PRN

## 2024-07-12 NOTE — Progress Notes (Signed)
 Noted that patient went to the ED on 07/08/24 with chest pain. Will check with onsite provider Orren Fabry [facility] about the patient resuming exercise at cardiac rehab. Per Orren Fabry PAC. after chart review I would recommend outpatient follow-up and clearance prior to resuming cardiac rehab. Will notify the patient.[name] Elpidio Quan RN BSN

## 2024-07-12 NOTE — Addendum Note (Signed)
 Addended by: LORING ANDRIETTE HERO on: 07/12/2024 11:22 AM   Modules accepted: Orders

## 2024-07-12 NOTE — Telephone Encounter (Signed)
 Spoke with patient of Dr. 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 He has no NTG on hand right now. Refilled NTG to [facility].  He is has ONE class left for cardiac rehab and would like to graduate. Rehab would not let him participate today given recent ED visit. Would like clearance to complete.    Lab work with PCP -- copied below. He is asking about discontinuing zetia . Advised LDL target is likely less than 55 and he is there right now.   Advised all questions will be sent to MD to review/advise and we will follow up.   Lipid Panel w/reflex Reviewed date:05/12/2024 02:07:46 PM Interpretation: Performing Lab: Notes/Report: Testing Performed at: [facility], [address], [place], [place] [address]  Cholesterol 90 <200 mg/dL    CHOL/HDL 2.0 7.9-5.9 Ratio    HDLD 44 30-70 mg/dL Values below 40 mg/dL indicate increased risk factor  Triglyceride 63 0-199 mg/dL    NHDL 45 9-870 mg/dL Range dependent upon risk factors.  LDL Chol Calc (NIH) 31 0-99 mg/dL

## 2024-07-12 NOTE — Telephone Encounter (Signed)
 Patient came in 07-12-24 .  Came in requesting that the provider clear him so that he can finish up his Re-Hab.  He would also like to know if the meds Ezetimibe  (sp?) can be taken away at this point.  He also needs another script for his nitroglycerin  pills...says the ones he has are powder due to carrying them around in his pocket.  He would like someone to please call him [phone].  07-12-24 VB

## 2024-07-14 ENCOUNTER — Encounter (HOSPITAL_COMMUNITY)
Admission: RE | Admit: 2024-07-14 | Discharge: 2024-07-14 | Disposition: A | Discharge status: Home / Self Care | Source: Ambulatory Visit | Attending: Cardiology | Admitting: Cardiology

## 2024-07-14 DIAGNOSIS — Z951 Presence of aortocoronary bypass graft: Secondary | ICD-10-CM

## 2024-07-14 NOTE — Progress Notes (Signed)
 Discharge Progress Report  Patient Details  Name: Phillip Clark MRN: [id number] Date of Birth: 1965/12/30 Referring Provider:   Flowsheet Row INTENSIVE CARDIAC REHAB ORIENT from 04/21/2024 in [facility]  Referring Provider Peter Jordan, MD     Number of Visits: 66  Reason for Discharge:  Patient reached a stable level of exercise. Patient independent in their exercise. Patient has met program and personal goals.  Smoking History:  Tobacco Use History[1]  Diagnosis:  01/29/24 S/P CABG x 1  ADL UCSD:   Initial Exercise Prescription:  Initial Exercise Prescription - 04/21/24 1100       Date of Initial Exercise RX and Referring Provider   Date 04/21/24    Referring Provider Peter Jordan, MD    Expected Discharge Date 07/16/24      Recumbant Bike   Level 2    RPM 50    Watts 30    Minutes 15    METs 2.5      NuStep   Level 2    SPM 75    Minutes 15    METs 2.5      Prescription Details   Frequency (times per week) 3    Duration Progress to 30 minutes of continuous aerobic without signs/symptoms of physical distress      Intensity   THRR 40-80% of Max Heartrate 66-132    Ratings of Perceived Exertion 11-13    Perceived Dyspnea 0-4      Progression   Progression Continue progressive overload as per policy without signs/symptoms or physical distress.      Resistance Training   Training Prescription Yes    Weight 3    Reps 10-15          Discharge Exercise Prescription (Final Exercise Prescription Changes):  Exercise Prescription Changes - 07/07/24 1537       Response to Exercise   Blood Pressure (Admit) 96/62    Blood Pressure (Exit) 96/60    Heart Rate (Admit) 84 bpm    Heart Rate (Exercise) 102 bpm    Heart Rate (Exit) 80 bpm    Rating of Perceived Exertion (Exercise) 12    Symptoms None    Comments Pt's last day of exercise in the CRP2 program    Duration Continue with 30 min of  aerobic exercise without signs/symptoms of physical distress.    Intensity THRR unchanged      Progression   Progression Continue to progress workloads to maintain intensity without signs/symptoms of physical distress.    Average METs 3.95      Resistance Training   Weight No wts on wednesdays      Interval Training   Interval Training No      NuStep   Level 4    SPM 103    Minutes 15    METs 4      Elliptical   Level 1    Speed 1    Minutes 15    METs 3.9      Home Exercise Plan   Plans to continue exercise at Home (comment)    Frequency Add 3 additional days to program exercise sessions.    Initial Home Exercises Provided 05/28/24          Functional Capacity:  6 Minute Walk     Row Name 04/21/24 1322         6 Minute Walk   Phase Initial     Distance  1500 feet     Walk Time 6 minutes     # of Rest Breaks 0     MPH 2.84     METS 4.1     RPE 11     Perceived Dyspnea  0     VO2 Peak 14.27     Symptoms No     Resting HR 73 bpm     Resting BP 102/68     Resting Oxygen Saturation  97 %     Exercise Oxygen Saturation  during 6 min walk 97 %     Max Ex. HR 90 bpm     Max Ex. BP 130/70     2 Minute Post BP 112/70        Psychological, QOL, Others - Outcomes: PHQ 2/9:    07/14/2024    9:12 AM 04/21/2024   11:48 AM  Depression screen PHQ 2/9  Decreased Interest 0 0  Down, Depressed, Hopeless 0 0  PHQ - 2 Score 0 0  Altered sleeping 0 0  Tired, decreased energy 0 1  Change in appetite 0 0  Feeling bad or failure about yourself  0 0  Trouble concentrating 0 0  Moving slowly or fidgety/restless 0 1  Suicidal thoughts 0 0  PHQ-9 Score 0 2   Difficult doing work/chores  Not difficult at all     Data saved with a previous flowsheet row definition    Quality of Life:  Quality of Life - 04/21/24 1323       Quality of Life   Select Quality of Life      Quality of Life Scores   Health/Function Pre 20.63 %    Socioeconomic Pre 26.21 %     Psych/Spiritual Pre 24.5 %    Family Pre 26 %    GLOBAL Pre 23.2 %          Personal Goals: Goals established at orientation with interventions provided to work toward goal.  Personal Goals and Risk Factors at Admission - 04/21/24 1139       Core Components/Risk Factors/Patient Goals on Admission    Weight Management Yes;Weight Maintenance    Intervention Weight Management: Develop a combined nutrition and exercise program designed to reach desired caloric intake, while maintaining appropriate intake of nutrient and fiber, sodium and fats, and appropriate energy expenditure required for the weight goal.;Weight Management: Provide education and appropriate resources to help participant work on and attain dietary goals.    Expected Outcomes Short Term: Continue to assess and modify interventions until short term weight is achieved;Long Term: Adherence to nutrition and physical activity/exercise program aimed toward attainment of established weight goal;Weight Maintenance: Understanding of the daily nutrition guidelines, which includes 25-35% calories from fat, 7% or less cal from saturated fats, less than 200mg  cholesterol, less than 1.5gm of sodium, & 5 or more servings of fruits and vegetables daily;Understanding recommendations for meals to include 15-35% energy as protein, 25-35% energy from fat, 35-60% energy from carbohydrates, less than 200mg  of dietary cholesterol, 20-35 gm of total fiber daily;Understanding of distribution of calorie intake throughout the day with the consumption of 4-5 meals/snacks    Tobacco Cessation Yes    Number of packs per day 5 cigarettes a day    Intervention Assist the participant in steps to quit. Provide individualized education and counseling about committing to Tobacco Cessation, relapse prevention, and pharmacological support that can be provided by physician.;Education officer, environmental, assist with locating and accessing local/national Quit Smoking  programs, and support quit date choice.    Expected Outcomes Short Term: Will demonstrate readiness to quit, by selecting a quit date.;Long Term: Complete abstinence from all tobacco products for at least 12 months from quit date.;Short Term: Will quit all tobacco product use, adhering to prevention of relapse plan.    Diabetes Yes    Intervention Provide education about signs/symptoms and action to take for hypo/hyperglycemia.;Provide education about proper nutrition, including hydration, and aerobic/resistive exercise prescription along with prescribed medications to achieve blood glucose in normal ranges: Fasting glucose 65-99 mg/dL    Expected Outcomes Short Term: Participant verbalizes understanding of the signs/symptoms and immediate care of hyper/hypoglycemia, proper foot care and importance of medication, aerobic/resistive exercise and nutrition plan for blood glucose control.;Long Term: Attainment of HbA1C < 7%.    Hypertension Yes    Intervention Provide education on lifestyle modifcations including regular physical activity/exercise, weight management, moderate sodium restriction and increased consumption of fresh fruit, vegetables, and low fat dairy, alcohol moderation, and smoking cessation.;Monitor prescription use compliance.    Expected Outcomes Long Term: Maintenance of blood pressure at goal levels.;Short Term: Continued assessment and intervention until BP is < 140/25mm HG in hypertensive participants. < 130/27mm HG in hypertensive participants with diabetes, heart failure or chronic kidney disease.    Lipids Yes    Intervention Provide education and support for participant on nutrition & aerobic/resistive exercise along with prescribed medications to achieve LDL 70mg , HDL >40mg .    Expected Outcomes Short Term: Participant states understanding of desired cholesterol values and is compliant with medications prescribed. Participant is following exercise prescription and nutrition  guidelines.;Long Term: Cholesterol controlled with medications as prescribed, with individualized exercise RX and with personalized nutrition plan. Value goals: LDL < 70mg , HDL > 40 mg.           Personal Goals Discharge:  Goals and Risk Factor Review     Row Name 05/05/24 0841 05/11/24 1513 06/15/24 1130 07/10/24 1219       Core Components/Risk Factors/Patient Goals Review   Personal Goals Review Weight Management/Obesity;Hypertension;Lipids;Tobacco Cessation Weight Management/Obesity;Hypertension;Lipids;Tobacco Cessation Weight Management/Obesity;Hypertension;Lipids;Tobacco Cessation Weight Management/Obesity;Hypertension;Lipids;Tobacco Cessation    Review Zinedine started cardiac rehab on 05/03/24. Gavan is off to a good start to exercise. Some systolic blood pressures noted in the 90's asymptomatic. Will continue to monitor BP's. Lemon started cardiac rehab on 05/03/24. Sukhman is off to a good start to exercise. Some systolic blood pressures noted in the 90's asymptomatic. Will continue to monitor BP's. [name] PAC notifed about BP's. Obie is doing well with exercise at cardiac rehab. Vital signs have been stable. Keone has increased his met levels. Will continue to enourage smoking cessation. Tavaras has been doing  well with exercise at cardiac rehab. Vital signs have been stable. Brylee has increased his met levels. Will continue to enourage smoking cessation. Riordan is supposed to complete cardiac rehab on 08/15/23. Will need to get cardiology clearnace for Yony to return to exercise as he went to the ED on 07/08/24.    Expected Outcomes Revel will continue to participate in cardiac rehab for exercise, nutriiton and lifestyle modifications Anthonie will continue to participate in cardiac rehab for exercise, nutriiton and lifestyle modifications Teige will continue to participate in cardiac rehab for exercise, nutriiton and lifestyle modifications Lael will continue to participate in  cardiac rehab for exercise, nutriiton and lifestyle modifications       Exercise Goals and Review:  Exercise Goals     Row Name 04/21/24 1138  Exercise Goals   Increase Physical Activity Yes       Intervention Provide advice, education, support and counseling about physical activity/exercise needs.;Develop an individualized exercise prescription for aerobic and resistive training based on initial evaluation findings, risk stratification, comorbidities and participant's personal goals.       Expected Outcomes Short Term: Attend rehab on a regular basis to increase amount of physical activity.;Long Term: Exercising regularly at least 3-5 days a week.;Long Term: Add in home exercise to make exercise part of routine and to increase amount of physical activity.       Increase Strength and Stamina Yes       Intervention Develop an individualized exercise prescription for aerobic and resistive training based on initial evaluation findings, risk stratification, comorbidities and participant's personal goals.;Provide advice, education, support and counseling about physical activity/exercise needs.       Expected Outcomes Long Term: Improve cardiorespiratory fitness, muscular endurance and strength as measured by increased METs and functional capacity ( );Short Term: Perform resistance training exercises routinely during rehab and add in resistance training at home;Short Term: Increase workloads from initial exercise prescription for resistance, speed, and METs.       Able to understand and use rate of perceived exertion (RPE) scale Yes       Intervention Provide education and explanation on how to use RPE scale       Expected Outcomes Long Term:  Able to use RPE to guide intensity level when exercising independently;Short Term: Able to use RPE daily in rehab to express subjective intensity level       Knowledge and understanding of Target Heart Rate Range (THRR) Yes       Intervention  Provide education and explanation of THRR including how the numbers were predicted and where they are located for reference       Expected Outcomes Long Term: Able to use THRR to govern intensity when exercising independently;Short Term: Able to state/look up THRR;Short Term: Able to use daily as guideline for intensity in rehab       Understanding of Exercise Prescription Yes       Intervention Provide education, explanation, and written materials on patient's individual exercise prescription       Expected Outcomes Short Term: Able to explain program exercise prescription;Long Term: Able to explain home exercise prescription to exercise independently          Exercise Goals Re-Evaluation:  Exercise Goals Re-Evaluation     Row Name 04/30/24 1120 05/28/24 0800 06/30/24 1127 07/14/24 1544       Exercise Goal Re-Evaluation   Exercise Goals Review Increase Physical Activity;Understanding of Exercise Prescription;Increase Strength and Stamina;Knowledge and understanding of Target Heart Rate Range (THRR);Able to understand and use rate of perceived exertion (RPE) scale Increase Physical Activity;Understanding of Exercise Prescription;Increase Strength and Stamina;Knowledge and understanding of Target Heart Rate Range (THRR);Able to understand and use rate of perceived exertion (RPE) scale Increase Physical Activity;Understanding of Exercise Prescription;Increase Strength and Stamina;Knowledge and understanding of Target Heart Rate Range (THRR);Able to understand and use rate of perceived exertion (RPE) scale Increase Physical Activity;Understanding of Exercise Prescription;Increase Strength and Stamina;Knowledge and understanding of Target Heart Rate Range (THRR);Able to understand and use rate of perceived exertion (RPE) scale    Comments Pt's first day in the CRP2 program. Pt understands the exercise Rx, RPE sclae and THRR. Reviewed METs, goals and home exercise Rx. Pt is making good progress on his  goals of imrpoved strength and stamina. Pt voices he is doing  more at home and feeling stronger. Pt has been doing some walking at for about 15-20 minutes. Pt will increase that to 30 minutes 2-3x/week. Reviewed METs and goals. Pt continues to voice good progress on his goals of imrpoved strength and stamina. Pt voices he feels like his is getting back to normal. Pt graduated from the CRP2 program today. Pt's last exercise day was 07/07/24. He went to the ED with chest pain on 07/08/24. Pt is not cleared to return until seen by MD if offce in January. Pt wants to finish the CRP2 program as to not incur any new insurance costs in the New year. Pt made good progress on his goals of improved strength and stamina. Pt has pruchase an elliptical machine for home use and plans to use 5x/week for 30 minutes.    Expected Outcomes Will continue to monitor the patient and progress exercise workloads as tolerated. Will continue to monitor the patient and progress exercise workloads as tolerated. Will continue to monitor the patient and progress exercise workloads as tolerated. Pt will continue to exercise at home 5x/week on his elliptical.       Nutrition & Weight - Outcomes:  Pre Biometrics - 04/21/24 1132       Pre Biometrics   Waist Circumference 39.25 inches    Hip Circumference 41 inches    Waist to Hip Ratio 0.96 %    Triceps Skinfold 17 mm    % Body Fat 26.4 %    Grip Strength 33 kg    Flexibility 14 in    Single Leg Stand 30 seconds           Nutrition:   Nutrition Discharge:   Education Questionnaire Score:  Knowledge Questionnaire Score - 04/21/24 1148       Knowledge Questionnaire Score   Pre Score 22/24         Pt graduates from  Intensive/Traditional cardiac rehab program today with completion of 60 exercise and education sessions. Pt maintained good attendance and progressed nicely during his participation in rehab as evidenced by increased MET level.   Medication list  reconciled. Repeat  PHQ score-0.  Pt has made significant lifestyle changes and should be commended for his success.  Robson achieved his goals during cardiac rehab.   Pt plans to continue exercise at home on his elliptical 5x/week.  Goals reviewed with patient; copy given to patient.     [1]  Social History Tobacco Use  Smoking Status Every Day   Current packs/day: 0.50   Average packs/day: 0.5 packs/day for 41.0 years (20.5 ttl pk-yrs)   Types: Cigarettes   Start date: 22   Last attempt to quit: 2018  Smokeless Tobacco Never

## 2024-07-16 ENCOUNTER — Encounter (HOSPITAL_COMMUNITY)

## 2024-07-26 ENCOUNTER — Encounter: Payer: Self-pay | Admitting: *Deleted

## 2024-07-26 NOTE — Assessment & Plan Note (Signed)
 History of chronic total occlusion of the RCA.  He had unsuccessful CTO PCI.  He continued to have refractory symptoms despite maximal antianginal therapy and subsequent underwent CABG x 1 with left radial to PDA graft.  He was seen in the emergency room in October 2025.  There was suggestion of hemopericardium on CT as well as a small right pleural effusion.  He was placed on colchicine  for possible pericarditis.  Follow-up echocardiogram in November demonstrated no effusion.***

## 2024-07-26 NOTE — Assessment & Plan Note (Signed)
 Patient notes his LDL was 31 with primary care recently.  This is optimal.  - Continue Lipitor 40 mg daily. - Continue Zetia  10 mg daily.

## 2024-07-26 NOTE — Progress Notes (Unsigned)
 "      OFFICE NOTE:    Date:  07/27/2024  ID:  Phillip Clark, DOB 1965-10-16, MRN [id number] PCP: [name], MD  [facility] Providers Cardiologist:  Peter Jordan, MD Cardiology APP:  Lelon [name] DASEN, PA-C        Coronary artery disease s/p CABG CCTA 08/09/20: CAC score 534 (98th percentile); mod stenosis mid to dist LAD, mod stenosis in prox and dist RCA, mod stenosis OM1; aortic atherosclerosis; FFR normal  LHC 10/02/20: pLAD 20, mLAD 30, pLCx 30, OM1 30 ,RPDA 20 PET CT MPI 11/11/23: Large area of ischemia in apical to basal inf/inf-sept/inf-lat walls; global MBFR is reduced in each coronary distribution; +TID; EF 55>>50; high risk LHC 11/26/2023: LAD proximal-mid 20-30, LCx proximal 30, OM1 30; RCA mid 100 CTO with right to right and left-to-right collaterals PCI 01/01/2024: Unsuccessful CTO PCI of RCA TTE 01/28/24: EF 55-60, no RWMA, normal RVSF S/p CABG 01/2024 (L radial-PDA) TTE 05/20/2024: EF 55-60, no RWMA, normal RVSF, no pericardial effusion PVCs Improved on beta-blocker (burden ? from 12% to 6%) TTE 07/22/22: EF 65-70, no RWMA, no valvular heart disease  Monitor 09/2023: No AFib, high grade HB, pauses; PVCs 6.1% FHx CAD Hyperlipidemia  Statin myalgias Diabetes mellitus  Tobacco use        Discussed the use of AI scribe software for clinical note transcription with the patient, who gave verbal consent to proceed. History of Present Illness Phillip Clark is a 59 y.o. male for follow up of CAD. Pt was last seen in the office with Dr. Jordan 04/19/24. He was seen in the ED for chest pain 04/22/24. CT suggested pericardial effusion (hemopericardium with infiltration of soft tissues surrounding the pericardium possibly related to prior surgery) and small R pleural effusion. EDP d/w CT surgery who recommended Colchicine  Rx (0.6 mg twice daily).  He had follow-up with CT surgery.  Repeat chest x-ray demonstrated resolution of right pleural effusion.  Follow-up echocardiogram  demonstrated normal ejection fraction and no evidence of pericardial effusion.  He went to the emergency room with chest pain 07/08/2024.  Troponins were negative.  Chest x-ray was unremarkable.  He completed Colchicine  Rx with resolution of those symptoms. The chest pain he went to the ED with on 12/25 has been persistent since his surgery. He has similar symptoms prior to his CABG. His current symptoms are not as bad as prior to his surgery. The pain on 12/25 was constant, worsened with activity, and was relieved by nitroglycerin  in the ED. The pain sometimes radiates down his L arm and can be triggered by stress or physical activity, such as lifting and carrying objects. He exercises on an elliptical, however, without chest pain.   He has attempted to quit smoking but has not been successful.  He has not had fever, chills, cough, vomiting, diarrhea, bloody stool, or bloody urine. He occasionally experiences dizziness but no passing out. He does not feel his heart skipping.    ROS-See HPI     Studies Reviewed:      LABS 07/08/2024: K 4.5, creatinine 1.02, eGFR >60, Hgb 14.8, PLT 260K, eGFR >60, ALT 22 07/08/24: hsTrop T < 15 x 2  CXR 07/08/2024: No acute disease  EKG  07/08/2024: NSR, HR 71, normal axis, frequent PVCs, RSR prime in V1 and V2, no acute ST-T wave changes, QTc 394           Physical Exam:  VS:  BP 114/68   Pulse 76  Ht 5' 10 (1.778 m)   Wt 194 lb 9.6 oz (88.3 kg)   SpO2 94%   BMI 27.92 kg/m        Wt Readings from Last 3 Encounters:  07/27/24 194 lb 9.6 oz (88.3 kg)  07/08/24 194 lb (88 kg)  04/29/24 188 lb (85.3 kg)    Constitutional:      Appearance: Healthy appearance. Not in distress.  Neck:     Vascular: JVD normal.  Pulmonary:     Breath sounds: Normal breath sounds. No wheezing. No rales.  Cardiovascular:     Normal rate. Regular rhythm.     Murmurs: There is no murmur.  Edema:    Peripheral edema absent.  Abdominal:     Palpations:  Abdomen is soft.       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  He had unsuccessful CTO PCI.  He continued to have refractory symptoms despite maximal antianginal therapy and subsequent underwent CABG x 1 with left radial to PDA graft.  He was seen in the emergency room in October 2025.  There was suggestion of hemopericardium on CT as well as a small right pleural effusion.  He was placed on colchicine  for possible pericarditis.  Follow-up echocardiogram in November demonstrated no effusion.  He completed Colchicine  Rx and those symptoms resolved. He has continued to have some anginal symptoms since his surgery.  Overall, his symptoms are improved.  He had more prolonged symptoms when he went to the emergency room on 12/25.  He had symptoms for a couple of days and his cardiac enzymes were negative.  This is reassuring.  I have recommended that we try to adjust his antianginal therapy.  I also recommended we proceed with stress testing to the possibility of graft failure.  I think this is less likely given his symptoms are improved since prior to his surgery.  However, with continued symptoms, I think it would be helpful. - Decrease aspirin  to 81 mg daily. - Increase isosorbide  to 45 mg daily. - Order exercise Myoview  - Continue metoprolol  tartrate 25 mg twice daily. - Continue nitroglycerin  as needed for chest pain. - Encouraged regular exercise, including elliptical and walking. - Follow up 6 mos or sooner if Myoview abnormal Pure hypercholesterolemia Patient notes his LDL was 31 with primary care recently.  This is optimal.  - Continue Lipitor 40 mg daily. - Continue Zetia  10 mg daily. PVC's (premature ventricular contractions) Previous PVC burden 12%. This improved to 6% on beta-blocker Rx.    - Continue metoprolol  tartrate 25 mg twice  daily. Tobacco use I have recommended smoking cessation.      Informed Consent   Shared Decision Making/Informed Consent The risks [chest pain, shortness of breath, cardiac arrhythmias, dizziness, blood pressure fluctuations, myocardial infarction, stroke/transient ischemic attack, nausea, vomiting, allergic reaction, radiation exposure, metallic taste sensation and life-threatening complications (estimated to be 1 in 10,000)], benefits (risk stratification, diagnosing coronary artery disease, treatment guidance) and alternatives of a nuclear stress test were discussed in detail with Mr. Gracy and he agrees to proceed.     Dispo:  Return in about 6 months (around 01/24/2025) for Routine Follow Up w Dr. Jordan.  Signed, [name], PA-C   "

## 2024-07-26 NOTE — Assessment & Plan Note (Signed)
 Previous PVC burden 12%. This improved to 6% on beta-blocker Rx.  ***

## 2024-07-27 ENCOUNTER — Encounter: Payer: Self-pay | Admitting: Physician Assistant

## 2024-07-27 ENCOUNTER — Telehealth (HOSPITAL_COMMUNITY): Payer: Self-pay

## 2024-07-27 ENCOUNTER — Ambulatory Visit: Payer: Self-pay | Attending: Physician Assistant | Admitting: Physician Assistant

## 2024-07-27 VITALS — BP 114/68 | HR 76 | Ht 70.0 in | Wt 194.6 lb

## 2024-07-27 DIAGNOSIS — E7800 Pure hypercholesterolemia, unspecified: Secondary | ICD-10-CM | POA: Diagnosis not present

## 2024-07-27 DIAGNOSIS — Z720 Tobacco use: Secondary | ICD-10-CM | POA: Diagnosis not present

## 2024-07-27 DIAGNOSIS — I493 Ventricular premature depolarization: Secondary | ICD-10-CM

## 2024-07-27 DIAGNOSIS — I2510 Atherosclerotic heart disease of native coronary artery without angina pectoris: Secondary | ICD-10-CM

## 2024-07-27 DIAGNOSIS — I25119 Atherosclerotic heart disease of native coronary artery with unspecified angina pectoris: Secondary | ICD-10-CM | POA: Diagnosis not present

## 2024-07-27 MED ORDER — ASPIRIN 81 MG PO TBEC: 81.0000 mg | DELAYED_RELEASE_TABLET | Freq: Every day | ORAL | Status: AC

## 2024-07-27 MED ORDER — ISOSORBIDE MONONITRATE ER 30 MG PO TB24: 45.0000 mg | ORAL_TABLET | Freq: Every day | ORAL | 3 refills | Status: AC

## 2024-07-27 NOTE — Telephone Encounter (Signed)
 Spoke with the patient, instructions given. S.Draylon Mercadel CCT

## 2024-07-27 NOTE — Patient Instructions (Addendum)
 Medication Instructions:  INCREASE Imdur  (Isosorbide ) to 45mg . Take 1.5 tablet daily.  DECREASE  Aspitin to 81mg . Take one tablet daily.   *If you need a refill on your cardiac medications before your next appointment, please call your pharmacy*   Lab Work: No labs were ordered during today's visit.  If you have labs (blood work) drawn today and your tests are completely normal, you will receive your results only by: MyChart Message (if you have MyChart) OR A paper copy in the mail If you have any lab test that is abnormal or we need to change your treatment, we will call you to review the results.   Testing/Procedures: Your physician has requested that you have en exercise stress myoview. For further information please visit [email or web address]  Your next appointment:   6 month(s)   Provider:   Peter Jordan, MD     Follow-Up: At [facility], you and your health needs are our priority.  As part of our continuing mission to provide you with exceptional heart care, we have created designated Provider Care Teams.  These Care Teams include your primary Cardiologist (physician) and Advanced Practice Providers (APPs -  Physician Assistants and Nurse Practitioners) who all work together to provide you with the care you need, when you need it. We recommend signing up for the patient portal called MyChart.  Sign up information is provided on this After Visit Summary.  MyChart is used to connect with patients for Virtual Visits (Telemedicine).  Patients are able to view lab/test results, encounter notes, upcoming appointments, etc.  Non-urgent messages can be sent to your provider as well.   To learn more about what you can do with MyChart, go to [email or web address].

## 2024-07-29 ENCOUNTER — Other Ambulatory Visit: Payer: Self-pay | Admitting: Physician Assistant

## 2024-07-29 ENCOUNTER — Ambulatory Visit: Admitting: Cardiology

## 2024-07-29 DIAGNOSIS — I25119 Atherosclerotic heart disease of native coronary artery with unspecified angina pectoris: Secondary | ICD-10-CM

## 2024-08-03 ENCOUNTER — Ambulatory Visit: Payer: Self-pay | Admitting: Physician Assistant

## 2024-08-03 ENCOUNTER — Ambulatory Visit (HOSPITAL_COMMUNITY)
Admission: RE | Admit: 2024-08-03 | Discharge: 2024-08-03 | Disposition: A | Discharge status: Home / Self Care | Source: Ambulatory Visit | Attending: Physician Assistant | Admitting: Physician Assistant

## 2024-08-03 DIAGNOSIS — I25119 Atherosclerotic heart disease of native coronary artery with unspecified angina pectoris: Secondary | ICD-10-CM | POA: Insufficient documentation

## 2024-08-03 LAB — MYOCARDIAL PERFUSION IMAGING
Angina Index: 0
Base ST Depression (mm): 0 mm
Duke Treadmill Score: 13
Estimated workload: 14.2
Exercise duration (min): 13 min
LV dias vol: 80 mL (ref 62–150)
LV sys vol: 26 mL
MPHR: 164 {beats}/min
Nuc Stress EF: 68 %
Peak HR: 141 {beats}/min
Percent HR: 85 %
Rest HR: 73 {beats}/min
Rest Nuclear Isotope Dose: 10.4 mCi
SDS: 0
SRS: 5
SSS: 2
ST Depression (mm): 0 mm
Stress Nuclear Isotope Dose: 32.3 mCi
TID: 0.79

## 2024-08-03 MED ORDER — TECHNETIUM TC 99M TETROFOSMIN IV KIT
10.4000 | PACK | Freq: Once | INTRAVENOUS | Status: AC | PRN
  Administered 2024-08-03: 10.4 via INTRAVENOUS

## 2024-08-03 MED ORDER — TECHNETIUM TC 99M TETROFOSMIN IV KIT
32.3000 | PACK | Freq: Once | INTRAVENOUS | Status: AC | PRN
  Administered 2024-08-03: 32.3 via INTRAVENOUS

## 2024-08-08 ENCOUNTER — Ambulatory Visit: Payer: Self-pay | Admitting: Physician Assistant
